# Patient Record
Sex: Female | Born: 1945 | Race: White | Hispanic: No | Smoking: Never smoker
Health system: Southern US, Community
[De-identification: ages and names within clinical notes are randomized; demographics above are authoritative.]

## PROBLEM LIST (undated history)

## (undated) DIAGNOSIS — J302 Other seasonal allergic rhinitis: Secondary | ICD-10-CM

## (undated) DIAGNOSIS — C801 Malignant (primary) neoplasm, unspecified: Secondary | ICD-10-CM

## (undated) DIAGNOSIS — N6019 Diffuse cystic mastopathy of unspecified breast: Secondary | ICD-10-CM

## (undated) DIAGNOSIS — M25562 Pain in left knee: Secondary | ICD-10-CM

## (undated) DIAGNOSIS — E559 Vitamin D deficiency, unspecified: Secondary | ICD-10-CM

## (undated) DIAGNOSIS — M1990 Unspecified osteoarthritis, unspecified site: Secondary | ICD-10-CM

## (undated) DIAGNOSIS — N952 Postmenopausal atrophic vaginitis: Secondary | ICD-10-CM

## (undated) DIAGNOSIS — K219 Gastro-esophageal reflux disease without esophagitis: Secondary | ICD-10-CM

## (undated) DIAGNOSIS — F325 Major depressive disorder, single episode, in full remission: Secondary | ICD-10-CM

## (undated) DIAGNOSIS — N950 Postmenopausal bleeding: Secondary | ICD-10-CM

## (undated) DIAGNOSIS — D493 Neoplasm of unspecified behavior of breast: Secondary | ICD-10-CM

## (undated) DIAGNOSIS — I509 Heart failure, unspecified: Secondary | ICD-10-CM

## (undated) DIAGNOSIS — G4730 Sleep apnea, unspecified: Secondary | ICD-10-CM

## (undated) DIAGNOSIS — M8580 Other specified disorders of bone density and structure, unspecified site: Secondary | ICD-10-CM

## (undated) DIAGNOSIS — T7840XA Allergy, unspecified, initial encounter: Secondary | ICD-10-CM

## (undated) DIAGNOSIS — M25561 Pain in right knee: Secondary | ICD-10-CM

## (undated) DIAGNOSIS — Z973 Presence of spectacles and contact lenses: Secondary | ICD-10-CM

## (undated) DIAGNOSIS — E785 Hyperlipidemia, unspecified: Secondary | ICD-10-CM

## (undated) DIAGNOSIS — Z87442 Personal history of urinary calculi: Secondary | ICD-10-CM

## (undated) DIAGNOSIS — R011 Cardiac murmur, unspecified: Secondary | ICD-10-CM

## (undated) DIAGNOSIS — N189 Chronic kidney disease, unspecified: Secondary | ICD-10-CM

## (undated) DIAGNOSIS — R920 Mammographic microcalcification found on diagnostic imaging of breast: Secondary | ICD-10-CM

## (undated) HISTORY — DX: Hyperlipidemia, unspecified: E78.5

## (undated) HISTORY — DX: Pain in right knee: M25.561

## (undated) HISTORY — DX: Major depressive disorder, single episode, in full remission: F32.5

## (undated) HISTORY — PX: WISDOM TOOTH EXTRACTION: SHX21

## (undated) HISTORY — DX: Pain in right knee: M25.562

## (undated) HISTORY — DX: Postmenopausal bleeding: N95.0

## (undated) HISTORY — DX: Diffuse cystic mastopathy of unspecified breast: N60.19

## (undated) HISTORY — DX: Mammographic microcalcification found on diagnostic imaging of breast: R92.0

## (undated) HISTORY — DX: Chronic kidney disease, unspecified: N18.9

## (undated) HISTORY — PX: CATARACT EXTRACTION, BILATERAL: SHX1313

## (undated) HISTORY — DX: Neoplasm of unspecified behavior of breast: D49.3

## (undated) HISTORY — DX: Gastro-esophageal reflux disease without esophagitis: K21.9

## (undated) HISTORY — DX: Postmenopausal atrophic vaginitis: N95.2

## (undated) HISTORY — DX: Malignant (primary) neoplasm, unspecified: C80.1

## (undated) HISTORY — DX: Vitamin D deficiency, unspecified: E55.9

## (undated) HISTORY — DX: Other specified disorders of bone density and structure, unspecified site: M85.80

## (undated) HISTORY — PX: HYSTEROSCOPY: SHX211

## (undated) HISTORY — DX: Allergy, unspecified, initial encounter: T78.40XA

## (undated) HISTORY — PX: DILATION AND CURETTAGE OF UTERUS: SHX78

## (undated) HISTORY — PX: COLONOSCOPY: SHX174

## (undated) HISTORY — DX: Sleep apnea, unspecified: G47.30

## (undated) HISTORY — DX: Heart failure, unspecified: I50.9

---

## 1998-03-22 ENCOUNTER — Other Ambulatory Visit: Admission: RE | Admit: 1998-03-22 | Discharge: 1998-03-22 | Payer: Self-pay | Admitting: Obstetrics and Gynecology

## 1999-04-05 ENCOUNTER — Other Ambulatory Visit: Admission: RE | Admit: 1999-04-05 | Discharge: 1999-04-05 | Payer: Self-pay | Admitting: Obstetrics and Gynecology

## 1999-05-10 ENCOUNTER — Other Ambulatory Visit: Admission: RE | Admit: 1999-05-10 | Discharge: 1999-05-10 | Payer: Self-pay | Admitting: Obstetrics and Gynecology

## 2000-04-11 ENCOUNTER — Other Ambulatory Visit: Admission: RE | Admit: 2000-04-11 | Discharge: 2000-04-11 | Payer: Self-pay | Admitting: Obstetrics and Gynecology

## 2000-09-16 HISTORY — PX: ENDOMETRIAL BIOPSY: SHX622

## 2000-12-22 ENCOUNTER — Ambulatory Visit (HOSPITAL_COMMUNITY): Admission: RE | Admit: 2000-12-22 | Discharge: 2000-12-22 | Payer: Self-pay | Admitting: Obstetrics and Gynecology

## 2000-12-22 ENCOUNTER — Encounter (INDEPENDENT_AMBULATORY_CARE_PROVIDER_SITE_OTHER): Payer: Self-pay

## 2000-12-22 ENCOUNTER — Encounter: Payer: Self-pay | Admitting: Obstetrics and Gynecology

## 2001-11-20 ENCOUNTER — Other Ambulatory Visit: Admission: RE | Admit: 2001-11-20 | Discharge: 2001-11-20 | Payer: Self-pay | Admitting: Obstetrics and Gynecology

## 2003-01-03 ENCOUNTER — Other Ambulatory Visit: Admission: RE | Admit: 2003-01-03 | Discharge: 2003-01-03 | Payer: Self-pay | Admitting: Obstetrics and Gynecology

## 2004-09-13 ENCOUNTER — Other Ambulatory Visit: Admission: RE | Admit: 2004-09-13 | Discharge: 2004-09-13 | Payer: Self-pay | Admitting: Obstetrics and Gynecology

## 2004-12-17 ENCOUNTER — Ambulatory Visit: Payer: Self-pay | Admitting: Internal Medicine

## 2005-01-10 ENCOUNTER — Ambulatory Visit: Payer: Self-pay | Admitting: Internal Medicine

## 2005-10-02 ENCOUNTER — Other Ambulatory Visit: Admission: RE | Admit: 2005-10-02 | Discharge: 2005-10-02 | Payer: Self-pay | Admitting: Obstetrics and Gynecology

## 2006-06-24 ENCOUNTER — Ambulatory Visit: Payer: Self-pay | Admitting: Internal Medicine

## 2006-07-02 ENCOUNTER — Ambulatory Visit: Payer: Self-pay | Admitting: Internal Medicine

## 2006-07-02 LAB — CONVERTED CEMR LAB
Chol/HDL Ratio, serum: 3.4
Cholesterol: 200 mg/dL (ref 0–200)
HDL: 59.3 mg/dL (ref >39.0)
Hgb A1c MFr Bld: 5.5 % (ref 4.6–6.0)
LDL Cholesterol: 126 mg/dL — ABNORMAL HIGH (ref 0–99)
Triglyceride fasting, serum: 72 mg/dL (ref 0–149)
VLDL: 14 mg/dL (ref 0–40)

## 2006-09-18 ENCOUNTER — Ambulatory Visit: Payer: Self-pay | Admitting: Internal Medicine

## 2006-09-18 LAB — CONVERTED CEMR LAB
ALT: 16 units/L (ref 0–40)
AST: 20 units/L (ref 0–37)
Chol/HDL Ratio, serum: 2.8
Cholesterol: 163 mg/dL (ref 0–200)
HDL: 58.9 mg/dL (ref >39.0)
LDL Cholesterol: 91 mg/dL (ref 0–99)
Triglyceride fasting, serum: 65 mg/dL (ref 0–149)
VLDL: 13 mg/dL (ref 0–40)

## 2006-10-03 ENCOUNTER — Other Ambulatory Visit: Admission: RE | Admit: 2006-10-03 | Discharge: 2006-10-03 | Payer: Self-pay | Admitting: Obstetrics and Gynecology

## 2007-05-11 ENCOUNTER — Telehealth (INDEPENDENT_AMBULATORY_CARE_PROVIDER_SITE_OTHER): Payer: Self-pay | Admitting: *Deleted

## 2007-07-03 ENCOUNTER — Telehealth (INDEPENDENT_AMBULATORY_CARE_PROVIDER_SITE_OTHER): Payer: Self-pay | Admitting: *Deleted

## 2007-07-03 ENCOUNTER — Ambulatory Visit: Payer: Self-pay | Admitting: Internal Medicine

## 2007-07-03 DIAGNOSIS — R0989 Other specified symptoms and signs involving the circulatory and respiratory systems: Secondary | ICD-10-CM

## 2007-07-03 DIAGNOSIS — G5600 Carpal tunnel syndrome, unspecified upper limb: Secondary | ICD-10-CM

## 2007-07-07 ENCOUNTER — Telehealth: Payer: Self-pay | Admitting: Internal Medicine

## 2007-08-17 ENCOUNTER — Ambulatory Visit: Payer: Self-pay | Admitting: Internal Medicine

## 2007-08-17 DIAGNOSIS — E782 Mixed hyperlipidemia: Secondary | ICD-10-CM

## 2007-08-17 DIAGNOSIS — Z8719 Personal history of other diseases of the digestive system: Secondary | ICD-10-CM

## 2007-08-24 ENCOUNTER — Encounter: Payer: Self-pay | Admitting: Internal Medicine

## 2007-08-24 LAB — CONVERTED CEMR LAB
ALT: 16 units/L (ref 0–35)
AST: 21 units/L (ref 0–37)
Albumin: 3.9 g/dL (ref 3.5–5.2)
Alkaline Phosphatase: 84 units/L (ref 39–117)
BUN: 9 mg/dL (ref 6–23)
Basophils Absolute: 0 10*3/uL (ref 0.0–0.1)
Basophils Relative: 0.7 % (ref 0.0–1.0)
Bilirubin, Direct: 0.1 mg/dL (ref 0.0–0.3)
CO2: 32 meq/L (ref 19–32)
Calcium: 9.3 mg/dL (ref 8.4–10.5)
Chloride: 105 meq/L (ref 96–112)
Cholesterol: 151 mg/dL (ref 0–200)
Creatinine, Ser: 0.7 mg/dL (ref 0.4–1.2)
Eosinophils Absolute: 0.4 10*3/uL (ref 0.0–0.6)
Eosinophils Relative: 7.2 % — ABNORMAL HIGH (ref 0.0–5.0)
GFR calc Af Amer: 109 mL/min
GFR calc non Af Amer: 90 mL/min
Glucose, Bld: 107 mg/dL — ABNORMAL HIGH (ref 70–99)
HCT: 40.6 % (ref 36.0–46.0)
HDL: 67 mg/dL (ref >39.0)
Hemoglobin: 14.2 g/dL (ref 12.0–15.0)
Hgb A1c MFr Bld: 5.8 % (ref 4.6–6.0)
LDL Cholesterol: 66 mg/dL (ref 0–99)
Lymphocytes Relative: 29.7 % (ref 12.0–46.0)
MCHC: 35 g/dL (ref 30.0–36.0)
MCV: 88.2 fL (ref 78.0–100.0)
Monocytes Absolute: 0.4 10*3/uL (ref 0.2–0.7)
Monocytes Relative: 7.3 % (ref 3.0–11.0)
Neutro Abs: 3.2 10*3/uL (ref 1.4–7.7)
Neutrophils Relative %: 55.1 % (ref 43.0–77.0)
Platelets: 190 10*3/uL (ref 150–400)
Potassium: 4.1 meq/L (ref 3.5–5.1)
RBC: 4.6 M/uL (ref 3.87–5.11)
RDW: 12.4 % (ref 11.5–14.6)
Sodium: 142 meq/L (ref 135–145)
TSH: 0.7 microintl units/mL (ref 0.35–5.50)
Total Bilirubin: 0.8 mg/dL (ref 0.3–1.2)
Total CHOL/HDL Ratio: 2.3
Total Protein: 7.2 g/dL (ref 6.0–8.3)
Triglycerides: 88 mg/dL (ref 0–149)
VLDL: 18 mg/dL (ref 0–40)
WBC: 5.7 10*3/uL (ref 4.5–10.5)

## 2007-10-07 ENCOUNTER — Other Ambulatory Visit: Admission: RE | Admit: 2007-10-07 | Discharge: 2007-10-07 | Payer: Self-pay | Admitting: Obstetrics and Gynecology

## 2007-11-06 ENCOUNTER — Ambulatory Visit: Payer: Self-pay | Admitting: Internal Medicine

## 2008-06-22 ENCOUNTER — Encounter (INDEPENDENT_AMBULATORY_CARE_PROVIDER_SITE_OTHER): Payer: Self-pay | Admitting: *Deleted

## 2008-08-29 ENCOUNTER — Encounter: Payer: Self-pay | Admitting: Internal Medicine

## 2008-10-14 ENCOUNTER — Other Ambulatory Visit: Admission: RE | Admit: 2008-10-14 | Discharge: 2008-10-14 | Payer: Self-pay | Admitting: Obstetrics and Gynecology

## 2008-10-14 ENCOUNTER — Ambulatory Visit: Payer: Self-pay | Admitting: Obstetrics and Gynecology

## 2008-10-14 ENCOUNTER — Encounter: Payer: Self-pay | Admitting: Obstetrics and Gynecology

## 2008-11-04 ENCOUNTER — Ambulatory Visit: Payer: Self-pay | Admitting: Obstetrics and Gynecology

## 2008-11-15 ENCOUNTER — Ambulatory Visit: Payer: Self-pay | Admitting: Internal Medicine

## 2008-11-15 DIAGNOSIS — R7309 Other abnormal glucose: Secondary | ICD-10-CM

## 2008-11-15 DIAGNOSIS — E559 Vitamin D deficiency, unspecified: Secondary | ICD-10-CM

## 2008-11-23 LAB — CONVERTED CEMR LAB
ALT: 20 units/L (ref 0–35)
AST: 22 units/L (ref 0–37)
Albumin: 4.1 g/dL (ref 3.5–5.2)
Alkaline Phosphatase: 80 units/L (ref 39–117)
BUN: 14 mg/dL (ref 6–23)
Basophils Absolute: 0.1 10*3/uL (ref 0.0–0.1)
Basophils Relative: 1.5 % (ref 0.0–3.0)
Bilirubin, Direct: 0.1 mg/dL (ref 0.0–0.3)
CO2: 31 meq/L (ref 19–32)
Calcium: 9.3 mg/dL (ref 8.4–10.5)
Chloride: 101 meq/L (ref 96–112)
Cholesterol: 170 mg/dL (ref 0–200)
Creatinine, Ser: 0.8 mg/dL (ref 0.4–1.2)
Eosinophils Absolute: 0.3 10*3/uL (ref 0.0–0.7)
Eosinophils Relative: 4.2 % (ref 0.0–5.0)
GFR calc Af Amer: 93 mL/min
GFR calc non Af Amer: 77 mL/min
Glucose, Bld: 87 mg/dL (ref 70–99)
HCT: 42.9 % (ref 36.0–46.0)
HDL: 71.2 mg/dL (ref >39.0)
Hemoglobin: 14.7 g/dL (ref 12.0–15.0)
Hgb A1c MFr Bld: 5.7 % (ref 4.6–6.0)
LDL Cholesterol: 84 mg/dL (ref 0–99)
Lymphocytes Relative: 31.1 % (ref 12.0–46.0)
MCHC: 34.2 g/dL (ref 30.0–36.0)
MCV: 88.3 fL (ref 78.0–100.0)
Monocytes Absolute: 0.4 10*3/uL (ref 0.1–1.0)
Monocytes Relative: 7 % (ref 3.0–12.0)
Neutro Abs: 3.5 10*3/uL (ref 1.4–7.7)
Neutrophils Relative %: 56.2 % (ref 43.0–77.0)
Platelets: 157 10*3/uL (ref 150–400)
Potassium: 4.1 meq/L (ref 3.5–5.1)
RBC: 4.86 M/uL (ref 3.87–5.11)
RDW: 12.5 % (ref 11.5–14.6)
Sodium: 140 meq/L (ref 135–145)
TSH: 0.64 microintl units/mL (ref 0.35–5.50)
Total Bilirubin: 1.2 mg/dL (ref 0.3–1.2)
Total CHOL/HDL Ratio: 2.4
Total Protein: 7.6 g/dL (ref 6.0–8.3)
Triglycerides: 76 mg/dL (ref 0–149)
VLDL: 15 mg/dL (ref 0–40)
Vit D, 25-Hydroxy: 32 ng/mL (ref 30–89)
WBC: 6.3 10*3/uL (ref 4.5–10.5)

## 2008-11-24 ENCOUNTER — Encounter (INDEPENDENT_AMBULATORY_CARE_PROVIDER_SITE_OTHER): Payer: Self-pay | Admitting: *Deleted

## 2008-12-30 ENCOUNTER — Encounter (INDEPENDENT_AMBULATORY_CARE_PROVIDER_SITE_OTHER): Payer: Self-pay | Admitting: *Deleted

## 2009-12-13 ENCOUNTER — Ambulatory Visit: Payer: Self-pay | Admitting: Obstetrics and Gynecology

## 2009-12-13 ENCOUNTER — Other Ambulatory Visit: Admission: RE | Admit: 2009-12-13 | Discharge: 2009-12-13 | Payer: Self-pay | Admitting: Obstetrics and Gynecology

## 2009-12-18 ENCOUNTER — Ambulatory Visit: Payer: Self-pay | Admitting: Obstetrics and Gynecology

## 2010-02-20 ENCOUNTER — Ambulatory Visit: Payer: Self-pay | Admitting: Obstetrics and Gynecology

## 2010-03-27 ENCOUNTER — Ambulatory Visit: Payer: Self-pay | Admitting: Obstetrics and Gynecology

## 2010-04-04 ENCOUNTER — Ambulatory Visit: Payer: Self-pay | Admitting: Obstetrics and Gynecology

## 2010-05-18 ENCOUNTER — Telehealth (INDEPENDENT_AMBULATORY_CARE_PROVIDER_SITE_OTHER): Payer: Self-pay | Admitting: *Deleted

## 2010-10-16 NOTE — Progress Notes (Signed)
Summary: refill  Phone Note Refill Request Message from:  Fax from Pharmacy on May 18, 2010 4:07 PM  Refills Requested: Medication #1:  LORATIDINE 10MG  TAKE ONE TABLET DAILY [facility] [address] - fax [phone]  Initial call taken by: Okey Regal [place],  May 18, 2010 4:11 PM  Follow-up for Phone Call        Patient needs to schedule a yearly appointment (30 min) Follow-up by: Shonna Chock CMA,  May 18, 2010 4:18 PM    New/Updated Medications: * LORATIDINE 10MG  1 by mouth once daily **APPOINTMENT DUE** Prescriptions: LORATIDINE 10MG  1 by mouth once daily **APPOINTMENT DUE**  #30 x 0   Entered by:   Shonna Chock CMA   Authorized by:   Marga Melnick MD   Signed by:   Shonna Chock CMA on 05/18/2010   Method used:   Faxed to ...       [facility] [address] [phone]* (retail)       [address]       [place], [place]  [address]       Ph: [phone]       Fax: [phone]   RxID:   [id number]

## 2010-12-10 ENCOUNTER — Other Ambulatory Visit (INDEPENDENT_AMBULATORY_CARE_PROVIDER_SITE_OTHER): Payer: BC Managed Care – PPO

## 2010-12-10 DIAGNOSIS — T887XXA Unspecified adverse effect of drug or medicament, initial encounter: Secondary | ICD-10-CM

## 2010-12-10 DIAGNOSIS — E785 Hyperlipidemia, unspecified: Secondary | ICD-10-CM

## 2010-12-10 LAB — LIPID PANEL
Cholesterol: 161 mg/dL (ref 0–200)
HDL: 81.7 mg/dL (ref >39.00)
LDL Cholesterol: 65 mg/dL (ref 0–99)
Total CHOL/HDL Ratio: 2
Triglycerides: 71 mg/dL (ref 0.0–149.0)
VLDL: 14.2 mg/dL (ref 0.0–40.0)

## 2010-12-10 LAB — HEPATIC FUNCTION PANEL
AST: 22 U/L (ref 0–37)
Albumin: 3.9 g/dL (ref 3.5–5.2)
Alkaline Phosphatase: 87 U/L (ref 39–117)
Total Protein: 6.7 g/dL (ref 6.0–8.3)

## 2011-01-07 ENCOUNTER — Encounter (INDEPENDENT_AMBULATORY_CARE_PROVIDER_SITE_OTHER): Payer: BC Managed Care – PPO | Admitting: Obstetrics and Gynecology

## 2011-01-07 ENCOUNTER — Other Ambulatory Visit (HOSPITAL_COMMUNITY)
Admission: RE | Admit: 2011-01-07 | Discharge: 2011-01-07 | Disposition: A | Discharge status: Home / Self Care | Payer: BC Managed Care – PPO | Source: Ambulatory Visit | Attending: Obstetrics and Gynecology | Admitting: Obstetrics and Gynecology

## 2011-01-07 ENCOUNTER — Other Ambulatory Visit: Payer: Self-pay | Admitting: Obstetrics and Gynecology

## 2011-01-07 DIAGNOSIS — Z01419 Encounter for gynecological examination (general) (routine) without abnormal findings: Secondary | ICD-10-CM

## 2011-01-07 DIAGNOSIS — Z124 Encounter for screening for malignant neoplasm of cervix: Secondary | ICD-10-CM | POA: Insufficient documentation

## 2011-01-14 ENCOUNTER — Telehealth: Payer: Self-pay | Admitting: Internal Medicine

## 2011-01-14 MED ORDER — PRAVASTATIN SODIUM 40 MG PO TABS: 40.0000 mg | ORAL_TABLET | ORAL | Status: DC

## 2011-01-14 NOTE — Telephone Encounter (Signed)
Dr.Hopper please advise, I reviewed Lab results in Epic and [facility] and they are unsigned from 12/10/2010

## 2011-01-14 NOTE — Telephone Encounter (Signed)
Patient had labs [phone] she hasnt received result --- she needs refill for pravistatin -- [facility] [address]

## 2011-01-14 NOTE — Telephone Encounter (Signed)
Questionably  not seen since March 2010. Pravastatin can be refilled for 90 days, but she needs followup office visit.

## 2011-01-14 NOTE — Telephone Encounter (Signed)
I called patient and left message on VM with Dr.Hopper's instructions and informed patient rx sent to pharmacy

## 2011-01-22 DIAGNOSIS — Z1211 Encounter for screening for malignant neoplasm of colon: Secondary | ICD-10-CM

## 2011-02-01 NOTE — Op Note (Signed)
[facility]  Patient:    [name], [name]                          MRN: [id number] Proc. Date: 12/22/00 Attending:  Rande Brunt. Eda Paschal, M.D.                           Operative Report  PREOPERATIVE DIAGNOSES:  Postmenopausal bleeding, cervical stenosis.  POSTOPERATIVE DIAGNOSES:  Postmenopausal bleeding, cervical stenosis, polypoid appearance of endocervix and several small leiomyoma within the uterine cavity.  INDICATIONS FOR PROCEDURE:  The patient presented to the office with postmenopausal bleeding on hormone replacement therapy. Because of a severely stenotic cervix, her cervix could not be dilated and therefore endometrial samplings could not be obtained. This was in spite of an ______ paracervical and complete patient cooperation. Ultrasound was done which showed a minimally enlarged endometrial cavity at 5 mm without any specific defects. However, the patient had an extremely abnormal looking endocervix that showed hypoechoic echo pattern with linear cystic areas and it was very unusual looking. As a result of the inability to sample the endometrium and/or the endocervix and this abnormal appearing endocervix on ultrasound, it was felt that the patient should be brought to the hospital to accomplish endometrial sampling. Because its felt that anesthesia was reasonable in the office, it was felt that general anesthesia alone would not be enough to allow this to be done and therefore it will be done under ultrasonic guidance for the above. If it is completely impossible to dilate the cervix then she will undergo a LEEP procedure to be sure that we sample this area that looks so abnormal in the endocervix on ultrasound.  FINDINGS:  External and vaginal exam was within normal limits. The cervix was stenotic. Once it was finally dilated, a severe amount of brown mucusy thick material was removed from the endocervix consistent with cervical stenosis  and buildup of blood. The uterus was anteverted, normal size and shape. There was no uterine descensus. There were several very small leiomyoma that were seen within the cavity that did not appear to be the source of any bleeding. They were both approximately 1-2 cm. Adnexa were palpably normal.  DESCRIPTION OF PROCEDURE:  After adequate general endotracheal anesthesia, the patient was placed in the dorsal lithotomy position, prepped and draped in the usual sterile manner. A single tooth tenaculum was placed in the anterior lip of the cervix and using extremely small cervical dilator, an attempt was made to dilate the cervix. Initially not even the external os could be pierced but finally it could be. Ultrasonic guidance was then utilized at this point to be sure the endometrial cavity was entered. The patients bladder was reasonably fully from her IV fluids but not full enough that abdominal ultrasound would help [place] with the above and therefore a Foley catheter was inserted in the patients bladder and her bladder was filled with fluid to enable good abdominal ultrasound. At this point going very carefully and slowly and having the ultrasound guide [place], we were able to dilate her to a #33 Pratt dilator. A hysteroscopic examination was done first with a diagnostic hysteroscope and then with a resectoscope and the above findings were noted. Once the cervix was finally dilated, copious amounts of brown thick mucosal like tissue was removed and this was all sent to pathology for tissue diagnosis. The appearance of the endocervix had a polypoid like  appearance. It was felt that this was probably due to the cervical stenosis as well as the retained blood. This area was vigorously curettaged and all material was sent to the lab. It really was impossible to resect this area without actually resecting almost the entire cervix and creating bleeding that would have been uncontrollable short of  hysterectomy. Even though the resectoscope was finally entered the uterus, it was still very difficult as a result of all the above and it was not felt that it was either wise nor prudent to try to resect these exceedingly small myomas that were present there that probably were not the source of the bleeding; however, we were now able to get adequate endometrial sampling. The rest of the endometrium looked exceedingly atrophic and it was felt that the patients bleeding was probably secondary to hormonal replacement, cervical stenosis and the above. At the termination of the procedure, there was no significant bleeding noted. Fluid deficit was minimal less than 100. The patient tolerated the procedure well and left the operating room in satisfactory condition. DD:  12/22/00 TD:  12/22/00 Job: [id number]

## 2011-03-30 ENCOUNTER — Encounter: Payer: Self-pay | Admitting: Internal Medicine

## 2011-04-01 ENCOUNTER — Ambulatory Visit (INDEPENDENT_AMBULATORY_CARE_PROVIDER_SITE_OTHER): Payer: BC Managed Care – PPO | Admitting: Internal Medicine

## 2011-04-01 ENCOUNTER — Encounter: Payer: BC Managed Care – PPO | Admitting: Internal Medicine

## 2011-04-01 ENCOUNTER — Encounter: Payer: Self-pay | Admitting: Internal Medicine

## 2011-04-01 VITALS — BP 116/71 | HR 60 | Temp 97.7°F | Resp 12 | Ht 64.75 in | Wt 201.0 lb

## 2011-04-01 DIAGNOSIS — E559 Vitamin D deficiency, unspecified: Secondary | ICD-10-CM

## 2011-04-01 DIAGNOSIS — Z0000 Encounter for general adult medical examination without abnormal findings: Secondary | ICD-10-CM

## 2011-04-01 DIAGNOSIS — G5600 Carpal tunnel syndrome, unspecified upper limb: Secondary | ICD-10-CM

## 2011-04-01 DIAGNOSIS — E782 Mixed hyperlipidemia: Secondary | ICD-10-CM

## 2011-04-01 DIAGNOSIS — Z8719 Personal history of other diseases of the digestive system: Secondary | ICD-10-CM

## 2011-04-01 MED ORDER — FLUTICASONE PROPIONATE 50 MCG/ACT NA SUSP: 2.0000 | Freq: Every day | NASAL | Status: DC

## 2011-04-01 NOTE — Progress Notes (Signed)
Subjective:    Patient ID: [name], female    DOB: [birth year], 64 y.o.   MRN: [id number]  HPI  [name]  is here for a physical; she has no acute issues.      Review of Systems Patient reports no vision/ hearing  changes, adenopathy,fever, weight change,  persistant / recurrent hoarseness , chest pain,palpitations,edema,persistant /recurrent cough, hemoptysis, dyspnea( rest/ exertional/paroxysmal nocturnal), gastrointestinal bleeding(melena, rectal bleeding), abdominal pain, significant heartburn,  bowel changes,GU symptoms(dysuria, hematuria,pyuria, incontinence) ), Gyn symptoms(abnormal  bleeding , pain),  syncope, focal weakness, memory loss, skin/hair /nail changes,abnormal bruising or bleeding, anxiety,or depression.   She has occasional positional numbness of her hands  In Yoga. She has occasional swallowing issues with large pills but no dysphasia with food.     Objective:   Physical Exam Gen.: Healthy and well-nourished in appearance. Alert, appropriate and cooperative throughout exam. Head: Normocephalic without obvious abnormalities Eyes: No corneal or conjunctival inflammation noted. Pupils equal round reactive to light and accommodation. Fundal exam is benign without hemorrhages, exudate, papilledema. Extraocular motion intact. Vision grossly normal with lenses. Ears: External  ear exam reveals no significant lesions or deformities. Canals clear .TMs normal. Hearing is grossly normal bilaterally. Nose: External nasal exam reveals no deformity or inflammation. Nasal mucosa are pink and moist. No lesions or exudates noted. Septum  : minimal septal dislocation  Mouth: Oral mucosa and oropharynx reveal no lesions or exudates. Teeth in good repair. Neck: No deformities, masses, or tenderness noted. Range of motion & . Thyroid  normal. Lungs: Normal respiratory effort; chest expands symmetrically. Lungs are clear to auscultation without rales, wheezes, or increased work of  breathing. Heart: Normal rate and rhythm. Normal S1 and S2. No gallop, click, or rub. Grade 1/2 - 1 basilar systolic  murmur. Abdomen: Bowel sounds normal; abdomen soft and nontender. No masses, organomegaly or hernias noted. Genitalia: Dr Silvio Clayman   .                                                                                   Musculoskeletal/extremities: No deformity or scoliosis noted of  the thoracic or lumbar spine. No clubbing, cyanosis, edema, or deformity noted. Range of motion  normal .Tone & strength  normal.Joints normal. Nail health  good. Vascular: Carotid, radial artery, dorsalis pedis and  posterior tibial pulses are full and equal.  Neurologic: Alert and oriented x3. Deep tendon reflexes symmetrical and normal. Slight tingling elicited with reflex  hammer over wrists         Skin: Intact without suspicious lesions or rashes. Lymph: No cervical, axillary  lymphadenopathy present. Psych: Mood and affect are normal. Normally interactive                                                                                       Assessment & Plan:  #1  comprehensive physical exam; no acute findings #2 see Problem List with Assessments & Recommendations Plan: see Orders

## 2011-04-01 NOTE — Patient Instructions (Addendum)
Preventive Health Care: Exercise  30-45  minutes a day, 3-4 days a week. Walking is especially valuable in preventing Osteoporosis. Eat a low-fat diet with lots of fruits and vegetables, up to 7-9 servings per day. Avoid obesity; your goal = waist less than 35 inches.Consume less than 30 grams of sugar per day from foods & drinks with High Fructose Corn Syrup as #2,3 or #4 on label. Please  schedule fasting Labs in [place] : NMR Lipoprofile ( 272.4, V17.3).Risk of premature heart attack or stroke increases as LDL or BAD cholesterol rises.Advanced cholesterol panels optimally determine risk base on particle composition ( NMR Lipoprofile ) or by assessing multiple other genetic risks([place] Heart Panel 1304X). These are indicated when LDL is > 130, especially if there is family history of heart attack in males before 77 or women before 20 . Please verify that your TSH and vitamin D level had been checked and are @ goal.TSH (Thyroid Stimulating Hormone) normal range = 0.35- 5.50. Ideal value is 1-3. A  Value below 0.35 indicates excessive thyroid supplementation (HYPERthyroid state) & a Value > 5.50 indicates inadequate replacement.(HYPOthyroid state) Either extreme can have adverse long  term effects.   The normal goal for  Vitamin D is 40-60. Vitamin D, along with calcium( 600 mg twice a day) & weight bearing exercises ( @ least 30 minutes of walking @ least 3X/ week),  is essential for bone health. Vitamin D is the # 1 cause of muscle pain in women.

## 2011-06-03 ENCOUNTER — Telehealth: Payer: Self-pay | Admitting: Internal Medicine

## 2011-06-03 NOTE — Telephone Encounter (Signed)
Left message on voicemail for patient to contact Dr.Gottsegen's office and have them fax to [phone] (Fax machine at nurses station)

## 2011-06-03 NOTE — Telephone Encounter (Signed)
Today I have not received these results; please ask Dr. Verl Dicker office to fax them if possible .Thanks

## 2011-06-03 NOTE — Telephone Encounter (Signed)
Patient said dr Oletha Blend  Was to fax lab  Vit d & tsh she wants to know if it was received & if she should repeat

## 2011-06-06 ENCOUNTER — Telehealth: Payer: Self-pay

## 2011-06-06 NOTE — Telephone Encounter (Signed)
Patient left message on voicemail questioning if we received labs from other Doctor office (Vit D and TSH), if we did will Dr.Hopper please review and advise.  (Labs were received and placed on ledge for review), Dr.Hopper please advise

## 2011-06-07 ENCOUNTER — Telehealth: Payer: Self-pay

## 2011-06-07 NOTE — Telephone Encounter (Signed)
OPENED IN ERROR

## 2011-06-07 NOTE — Telephone Encounter (Signed)
Left message to call office

## 2011-06-07 NOTE — Telephone Encounter (Signed)
Gyn records revealed a vitamin D level of 34 on 01/20/2008; thyroid function test was not checked.  Minimal vitamin D level would be 40-60; I recommend checking TSH and vitamin D levels(794.5,268.9)

## 2011-06-07 NOTE — Telephone Encounter (Signed)
Discussed with patient and she had questions about additional labs..Call xfr'd to Chrae's Voicemail per her request and she will call patient back to discuss    KP

## 2011-06-07 NOTE — Telephone Encounter (Signed)
Spoke with patient and scheduled appointment.

## 2011-06-28 ENCOUNTER — Other Ambulatory Visit: Payer: Self-pay | Admitting: Internal Medicine

## 2011-06-28 DIAGNOSIS — E550 Rickets, active: Secondary | ICD-10-CM

## 2011-06-28 DIAGNOSIS — E039 Hypothyroidism, unspecified: Secondary | ICD-10-CM

## 2011-06-28 DIAGNOSIS — E785 Hyperlipidemia, unspecified: Secondary | ICD-10-CM

## 2011-06-28 DIAGNOSIS — T887XXA Unspecified adverse effect of drug or medicament, initial encounter: Secondary | ICD-10-CM

## 2011-07-01 ENCOUNTER — Other Ambulatory Visit (INDEPENDENT_AMBULATORY_CARE_PROVIDER_SITE_OTHER): Payer: Medicare Other

## 2011-07-01 ENCOUNTER — Other Ambulatory Visit: Payer: Self-pay | Admitting: Internal Medicine

## 2011-07-01 DIAGNOSIS — E785 Hyperlipidemia, unspecified: Secondary | ICD-10-CM

## 2011-07-01 DIAGNOSIS — E039 Hypothyroidism, unspecified: Secondary | ICD-10-CM

## 2011-07-01 DIAGNOSIS — T887XXA Unspecified adverse effect of drug or medicament, initial encounter: Secondary | ICD-10-CM

## 2011-07-01 DIAGNOSIS — E550 Rickets, active: Secondary | ICD-10-CM

## 2011-07-01 NOTE — Progress Notes (Signed)
Labs only

## 2011-07-02 NOTE — Telephone Encounter (Signed)
error 

## 2011-07-08 ENCOUNTER — Encounter: Payer: Self-pay | Admitting: Internal Medicine

## 2011-07-11 ENCOUNTER — Encounter: Payer: Self-pay | Admitting: Internal Medicine

## 2011-11-26 ENCOUNTER — Encounter: Payer: Self-pay | Admitting: Obstetrics and Gynecology

## 2011-11-28 ENCOUNTER — Other Ambulatory Visit: Payer: Self-pay | Admitting: *Deleted

## 2011-11-28 DIAGNOSIS — R928 Other abnormal and inconclusive findings on diagnostic imaging of breast: Secondary | ICD-10-CM

## 2011-11-29 ENCOUNTER — Other Ambulatory Visit: Payer: Self-pay | Admitting: Obstetrics and Gynecology

## 2011-11-29 ENCOUNTER — Other Ambulatory Visit: Payer: Self-pay | Admitting: *Deleted

## 2011-11-29 DIAGNOSIS — R921 Mammographic calcification found on diagnostic imaging of breast: Secondary | ICD-10-CM

## 2011-12-02 ENCOUNTER — Other Ambulatory Visit: Payer: Self-pay | Admitting: *Deleted

## 2011-12-02 DIAGNOSIS — Z712 Person consulting for explanation of examination or test findings: Secondary | ICD-10-CM

## 2011-12-11 ENCOUNTER — Encounter: Payer: Self-pay | Admitting: Gastroenterology

## 2011-12-19 ENCOUNTER — Encounter (INDEPENDENT_AMBULATORY_CARE_PROVIDER_SITE_OTHER): Payer: Self-pay | Admitting: Surgery

## 2011-12-20 ENCOUNTER — Telehealth (INDEPENDENT_AMBULATORY_CARE_PROVIDER_SITE_OTHER): Payer: Self-pay

## 2011-12-20 NOTE — Telephone Encounter (Signed)
Left message about time change to 10:45 from 11:30am on April 8th, 2013.

## 2011-12-23 ENCOUNTER — Telehealth (INDEPENDENT_AMBULATORY_CARE_PROVIDER_SITE_OTHER): Payer: Self-pay | Admitting: Surgery

## 2011-12-23 ENCOUNTER — Encounter (INDEPENDENT_AMBULATORY_CARE_PROVIDER_SITE_OTHER): Payer: Self-pay | Admitting: Surgery

## 2011-12-23 ENCOUNTER — Ambulatory Visit (INDEPENDENT_AMBULATORY_CARE_PROVIDER_SITE_OTHER): Payer: Medicare Other | Admitting: Surgery

## 2011-12-23 VITALS — BP 128/76 | HR 70 | Temp 97.4°F | Resp 18 | Ht 64.5 in | Wt 202.8 lb

## 2011-12-23 DIAGNOSIS — N6089 Other benign mammary dysplasias of unspecified breast: Secondary | ICD-10-CM

## 2011-12-23 DIAGNOSIS — N6099 Unspecified benign mammary dysplasia of unspecified breast: Secondary | ICD-10-CM

## 2011-12-23 NOTE — Progress Notes (Signed)
Chief Complaint  Patient presents with  . New Evaluation    Breast - atypical hyperplasia for excision - referral from Dr. 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 Patient had routine screening mammogram in early March 2013. This showed a cluster of calcifications in the right breast. The patient underwent additional views and stereotactic core needle biopsy. 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 Patient is referred at this time for wire localized excision.  Patient has no prior history of breast disease. She has had no prior breast biopsies. There is no immediate family member with a history of breast cancer.  Past Medical History  Diagnosis Date  . Hyperlipidemia   . Microcalcifications of the breast     atypical ductal hyperplasia with calcifications  . Breast lobular neoplasia     atypical  . Fibrocystic breast changes   . Osteopenia      Current Outpatient Prescriptions  Medication Sig Dispense Refill  . Calcium-Magnesium 250-125 MG TABS Take by mouth daily.        . Cholecalciferol (VITAMIN D) 1000 UNITS capsule Take 1,000 Units by mouth daily.        . ergocalciferol (VITAMIN D2) 50000 UNITS capsule Take 50,000 Units by mouth once a week.        . fluticasone (FLONASE) 50 MCG/ACT nasal spray Place 2 sprays into the nose daily.  16 g  11  . loratadine (CLARITIN) 10 MG tablet Take 10 mg by mouth daily.        . Magnesium 500 MG CAPS Take by mouth daily.        . Multiple Vitamin (MULTIVITAMIN) tablet Take 1 tablet by mouth daily.        . Omega-3 Fatty Acids (FISH OIL) 1000 MG CAPS Take by mouth daily.        . Polyethyl Glycol-Propyl Glycol (SYSTANE) 0.4-0.3 % SOLN Apply to eye. 2-3 x daily          Allergies  Allergen Reactions  . Codeine Rash     Family History  Problem Relation Age of  Onset  . Alcohol abuse Mother   . Cancer Father     bone marrow cancer  . Alcohol abuse Father   . Depression Son     Recovering alcoholic  . Depression Maternal Aunt   . Cancer Maternal Uncle     prostate cancer  . Diabetes Paternal Uncle   . Heart attack Paternal Uncle 51  . Cancer Paternal Grandmother     skin cancer     History   Social History  . Marital Status: Divorced    Spouse Name: N/A    Number of Children: N/A  . Years of Education: N/A   Social History Main Topics  . Smoking status: Never Smoker   . Smokeless tobacco: Never Used  . Alcohol Use: 1.2 oz/week    2 Glasses of wine per week     socially 1 - 2 glasses of wine per week  . Drug Use: No  . Sexually Active: None   Other Topics Concern  . None   Social History Narrative  . None     REVIEW OF SYSTEMS - PERTINENT POSITIVES ONLY: Brief right breast nipple discharge after childbirth.  No other significant history of breast disease.  EXAM: Filed Vitals:   12/23/11 1029  BP: 128/76  Pulse: 70    Temp: 97.4 F (36.3 C)  Resp: 18    HEENT: normocephalic; pupils equal and reactive; sclerae clear; dentition good; mucous membranes moist NECK:  symmetric on extension; no palpable anterior or posterior cervical lymphadenopathy; no supraclavicular masses; no tenderness CHEST: clear to auscultation bilaterally without rales, rhonchi, or wheezes BREAST: Right breast with normal nipple areolar complex. Well-healed biopsy site at 12:00 position right upper breast. Slight nodularity to breast parenchyma without dominant or discrete mass. Right axilla is free of adenopathy. Left breast shows normal nipple areolar complex. Slight nodularity of the breast parenchyma without dominant or discrete mass. Left axilla is free of adenopathy. 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 I think she will require wire localized excisional biopsy for definitive diagnosis and management of atypical ductal hyperplasia and atypical lobular hyperplasia. This can be performed as an outpatient procedure. We discussed risk and benefits as well as the possibility of additional surgery. She understands and wishes to proceed.  The risks and benefits of the procedure have been discussed at length with the patient.  The patient understands the proposed procedure, potential alternative treatments, and the course of recovery to be expected.  All of the patient's questions have been answered at this time.  The patient wishes to proceed with surgery.  Emmaus Brandi M. Delisia Mcquiston, MD, FACS General & Endocrine Surgery [facility]   Visit Diagnoses: 1. Atypical ductal hyperplasia of breast, right     Primary Care Physician: William Hopper, MD, MD  Radiology:  Dr. Margaret Bertrand GYN:  Dr. Daniel Gottsegen  

## 2011-12-23 NOTE — Patient Instructions (Signed)
[facility] °Office Phone Number [phone] ° °BREAST BIOPSY/ PARTIAL MASTECTOMY: POST OP INSTRUCTIONS ° °Always review your discharge instruction sheet given to you by the facility where your surgery was performed. ° °IF YOU HAVE DISABILITY OR FAMILY LEAVE FORMS, YOU MUST BRING THEM TO THE OFFICE FOR PROCESSING.  DO NOT GIVE THEM TO YOUR DOCTOR. ° °1. A prescription for pain medication may be given to you upon discharge.  Take your pain medication as prescribed, if needed.  If narcotic pain medicine is not needed, then you may take acetaminophen (Tylenol) or ibuprofen (Advil) as needed. °2. Take your usually prescribed medications unless otherwise directed °3. If you need a refill on your pain medication, please contact your pharmacy.  They will contact our office to request authorization.  Prescriptions will not be filled after 5pm or on week-ends. °4. You should eat very light the first 24 hours after surgery, such as soup, crackers, pudding, etc.  Resume your normal diet the day after surgery. °5. Most patients will experience some swelling and bruising in the breast.  Ice packs and a good support bra will help.  Swelling and bruising can take several days to resolve.  °6. It is common to experience some constipation if taking pain medication after surgery.  Increasing fluid intake and taking a stool softener will usually help or prevent this problem from occurring.  A mild laxative (Milk of Magnesia or Miralax) should be taken according to package directions if there are no bowel movements after 48 hours. °7. Unless discharge instructions indicate otherwise, you may remove your bandages 24-48 hours after surgery, and you may shower at that time.  You may have steri-strips (small skin tapes) in place directly over the incision.  These strips should be left on the skin for 7-10 days.  If your surgeon used skin glue on the incision, you may shower in 24 hours.  The glue will flake off over the  next 2-3 weeks.  Any sutures or staples will be removed at the office during your follow-up visit. °8. ACTIVITIES:  You may resume regular daily activities (gradually increasing) beginning the next day.  Wearing a good support bra or sports bra minimizes pain and swelling.  You may have sexual intercourse when it is comfortable. °a. You may drive when you no longer are taking prescription pain medication, you can comfortably wear a seatbelt, and you can safely maneuver your car and apply brakes. °b. RETURN TO WORK:  ______________________________________________________________________________________ °9. You should see your doctor in the office for a follow-up appointment approximately two weeks after your surgery.  Your doctor’s nurse will typically make your follow-up appointment when she calls you with your pathology report.  Expect your pathology report 2-3 business days after your surgery.  You may call to check if you do not hear from us after three days. °10. OTHER INSTRUCTIONS: _______________________________________________________________________________________________ _____________________________________________________________________________________________________________________________________ °_____________________________________________________________________________________________________________________________________ °_____________________________________________________________________________________________________________________________________ ° °WHEN TO CALL YOUR DOCTOR: °1. Fever over 101.0 °2. Nausea and/or vomiting. °3. Extreme swelling or bruising. °4. Continued bleeding from incision. °5. Increased pain, redness, or drainage from the incision. ° °The clinic staff is available to answer your questions during regular business hours.  Please don’t hesitate to call and ask to speak to one of the nurses for clinical concerns.  If you have a medical emergency, go to the nearest  emergency room or call 911.  A surgeon from [facility] is always on call at the hospital. ° °For further questions, please visit [email or web address]  °

## 2011-12-26 ENCOUNTER — Encounter: Payer: Self-pay | Admitting: Internal Medicine

## 2011-12-27 ENCOUNTER — Encounter: Payer: Self-pay | Admitting: Gynecology

## 2011-12-27 DIAGNOSIS — N950 Postmenopausal bleeding: Secondary | ICD-10-CM | POA: Insufficient documentation

## 2011-12-27 DIAGNOSIS — N952 Postmenopausal atrophic vaginitis: Secondary | ICD-10-CM | POA: Insufficient documentation

## 2012-01-03 ENCOUNTER — Encounter (HOSPITAL_BASED_OUTPATIENT_CLINIC_OR_DEPARTMENT_OTHER): Payer: Self-pay | Admitting: *Deleted

## 2012-01-03 NOTE — Progress Notes (Signed)
No labs needed

## 2012-01-08 ENCOUNTER — Ambulatory Visit (INDEPENDENT_AMBULATORY_CARE_PROVIDER_SITE_OTHER): Payer: Medicare Other | Admitting: Obstetrics and Gynecology

## 2012-01-08 ENCOUNTER — Other Ambulatory Visit (HOSPITAL_COMMUNITY)
Admission: RE | Admit: 2012-01-08 | Discharge: 2012-01-08 | Disposition: A | Discharge status: Home / Self Care | Payer: Medicare Other | Source: Ambulatory Visit | Attending: Obstetrics and Gynecology | Admitting: Obstetrics and Gynecology

## 2012-01-08 ENCOUNTER — Encounter: Payer: Self-pay | Admitting: Obstetrics and Gynecology

## 2012-01-08 VITALS — BP 124/74 | Ht 64.5 in | Wt 203.0 lb

## 2012-01-08 DIAGNOSIS — N6099 Unspecified benign mammary dysplasia of unspecified breast: Secondary | ICD-10-CM

## 2012-01-08 DIAGNOSIS — N3941 Urge incontinence: Secondary | ICD-10-CM

## 2012-01-08 DIAGNOSIS — N952 Postmenopausal atrophic vaginitis: Secondary | ICD-10-CM

## 2012-01-08 DIAGNOSIS — M8580 Other specified disorders of bone density and structure, unspecified site: Secondary | ICD-10-CM

## 2012-01-08 DIAGNOSIS — N6089 Other benign mammary dysplasias of unspecified breast: Secondary | ICD-10-CM

## 2012-01-08 DIAGNOSIS — Z124 Encounter for screening for malignant neoplasm of cervix: Secondary | ICD-10-CM | POA: Insufficient documentation

## 2012-01-08 DIAGNOSIS — N390 Urinary tract infection, site not specified: Secondary | ICD-10-CM

## 2012-01-08 DIAGNOSIS — M899 Disorder of bone, unspecified: Secondary | ICD-10-CM

## 2012-01-08 MED ORDER — ESTROGENS, CONJUGATED 0.625 MG/GM VA CREA: 1.0000 g | TOPICAL_CREAM | Freq: Every day | VAGINAL | Status: DC

## 2012-01-08 NOTE — Progress Notes (Signed)
Patient came to see me today for further followup. She is on Premarin vaginal cream both for treatment of atrophic vaginitis and urgency incontinence with excellent results. She does have a history of recurrent urinary tract infections as well but is doing well currently. She does have osteopenia without an elevated FRAX risk. She takes calcium and vitamin D. She's had no fractures. She is due for bone density later this year. She is having no vaginal bleeding. She is having no pelvic pain. She had an abnormal mammogram and biopsy showed atypical ductal hyperplasia and she is going to have wide excision tomorrow. She is having no vaginal bleeding. She is having no pelvic pain.  ROS: 12 system review done. Pertinent positives above. Other positives are hyperlipidemia, carpal tunnel syndrome, and vitamin D deficiency.  Physical examination: Kennon Portela present. HEENT within normal limits. Neck: Thyroid not large. No masses. Supraclavicular nodes: not enlarged. Breasts: Examined in both sitting and lying  position. No skin changes and no masses. Abdomen: Soft no guarding rebound or masses or hernia. Pelvic: External: Within normal limits. BUS: Within normal limits. Vaginal:within normal limits. Good estrogen effect. No evidence of cystocele rectocele or enterocele. Cervix: clean. Uterus: Normal size and shape. Adnexa: No masses. Rectovaginal exam: Confirmatory and negative. Extremities: Within normal limits.  Assessment: #1. Atrophic vaginitis #2. Osteopenia #3. 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 Surgery for ductal hyperplasia. Bone density in June.

## 2012-01-09 ENCOUNTER — Ambulatory Visit (HOSPITAL_BASED_OUTPATIENT_CLINIC_OR_DEPARTMENT_OTHER): Payer: Medicare Other | Admitting: Certified Registered Nurse Anesthetist

## 2012-01-09 ENCOUNTER — Encounter (HOSPITAL_BASED_OUTPATIENT_CLINIC_OR_DEPARTMENT_OTHER): Payer: Self-pay | Admitting: Certified Registered Nurse Anesthetist

## 2012-01-09 ENCOUNTER — Encounter (HOSPITAL_BASED_OUTPATIENT_CLINIC_OR_DEPARTMENT_OTHER)
Admission: RE | Disposition: A | Discharge status: Home / Self Care | Payer: Self-pay | Source: Ambulatory Visit | Attending: Surgery

## 2012-01-09 ENCOUNTER — Encounter (HOSPITAL_BASED_OUTPATIENT_CLINIC_OR_DEPARTMENT_OTHER): Payer: Self-pay | Admitting: *Deleted

## 2012-01-09 ENCOUNTER — Encounter (HOSPITAL_BASED_OUTPATIENT_CLINIC_OR_DEPARTMENT_OTHER): Payer: Self-pay | Admitting: Anesthesiology

## 2012-01-09 ENCOUNTER — Ambulatory Visit (HOSPITAL_BASED_OUTPATIENT_CLINIC_OR_DEPARTMENT_OTHER)
Admission: RE | Admit: 2012-01-09 | Discharge: 2012-01-09 | Disposition: A | Discharge status: Home / Self Care | Payer: Medicare Other | Source: Ambulatory Visit | Attending: Surgery | Admitting: Surgery

## 2012-01-09 DIAGNOSIS — N6089 Other benign mammary dysplasias of unspecified breast: Secondary | ICD-10-CM | POA: Insufficient documentation

## 2012-01-09 DIAGNOSIS — E785 Hyperlipidemia, unspecified: Secondary | ICD-10-CM | POA: Insufficient documentation

## 2012-01-09 DIAGNOSIS — N6099 Unspecified benign mammary dysplasia of unspecified breast: Secondary | ICD-10-CM | POA: Insufficient documentation

## 2012-01-09 HISTORY — DX: Other seasonal allergic rhinitis: J30.2

## 2012-01-09 HISTORY — PX: BREAST BIOPSY: SHX20

## 2012-01-09 LAB — URINALYSIS W MICROSCOPIC + REFLEX CULTURE
Bacteria, UA: NONE SEEN
Bilirubin Urine: NEGATIVE
Glucose, UA: NEGATIVE mg/dL
Hgb urine dipstick: NEGATIVE
Ketones, ur: NEGATIVE mg/dL
Protein, ur: NEGATIVE mg/dL
Squamous Epithelial / LPF: NONE SEEN

## 2012-01-09 SURGERY — BREAST BIOPSY WITH NEEDLE LOCALIZATION
Anesthesia: General | Site: Breast | Laterality: Right | Wound class: Clean

## 2012-01-09 MED ORDER — LIDOCAINE HCL (CARDIAC) 20 MG/ML IV SOLN
INTRAVENOUS | Status: DC | PRN
  Administered 2012-01-09: 50 mg via INTRAVENOUS

## 2012-01-09 MED ORDER — FENTANYL CITRATE 0.05 MG/ML IJ SOLN
INTRAMUSCULAR | Status: DC | PRN
  Administered 2012-01-09: 100 ug via INTRAVENOUS

## 2012-01-09 MED ORDER — CEFAZOLIN SODIUM-DEXTROSE 2-3 GM-% IV SOLR
2.0000 g | INTRAVENOUS | Status: AC
  Administered 2012-01-09: 2 g via INTRAVENOUS

## 2012-01-09 MED ORDER — ACETAMINOPHEN 10 MG/ML IV SOLN
1000.0000 mg | Freq: Once | INTRAVENOUS | Status: AC
  Administered 2012-01-09: 1000 mg via INTRAVENOUS

## 2012-01-09 MED ORDER — ONDANSETRON HCL 4 MG/2ML IJ SOLN
INTRAMUSCULAR | Status: DC | PRN
  Administered 2012-01-09: 4 mg via INTRAVENOUS

## 2012-01-09 MED ORDER — HYDROMORPHONE HCL PF 1 MG/ML IJ SOLN: 0.2500 mg | INTRAMUSCULAR | Status: DC | PRN

## 2012-01-09 MED ORDER — MIDAZOLAM HCL 5 MG/5ML IJ SOLN
INTRAMUSCULAR | Status: DC | PRN
  Administered 2012-01-09: 2 mg via INTRAVENOUS

## 2012-01-09 MED ORDER — LACTATED RINGERS IV SOLN
INTRAVENOUS | Status: DC
  Administered 2012-01-09 (×2): via INTRAVENOUS

## 2012-01-09 MED ORDER — PROPOFOL 10 MG/ML IV EMUL
INTRAVENOUS | Status: DC | PRN
  Administered 2012-01-09: 150 mg via INTRAVENOUS

## 2012-01-09 MED ORDER — BUPIVACAINE HCL (PF) 0.5 % IJ SOLN
INTRAMUSCULAR | Status: DC | PRN
  Administered 2012-01-09: 10 mL

## 2012-01-09 MED ORDER — DEXAMETHASONE SODIUM PHOSPHATE 4 MG/ML IJ SOLN
INTRAMUSCULAR | Status: DC | PRN
  Administered 2012-01-09: 10 mg via INTRAVENOUS

## 2012-01-09 MED ORDER — HYDROCODONE-ACETAMINOPHEN 10-325 MG PO TABS: 1.0000 | ORAL_TABLET | ORAL | Status: AC | PRN

## 2012-01-09 MED ORDER — DROPERIDOL 2.5 MG/ML IJ SOLN
INTRAMUSCULAR | Status: DC | PRN
  Administered 2012-01-09: 0.625 mg via INTRAVENOUS

## 2012-01-09 MED ORDER — CEFAZOLIN SODIUM 1-5 GM-% IV SOLN: 1.0000 g | INTRAVENOUS | Status: DC

## 2012-01-09 SURGICAL SUPPLY — 38 items
APL SKNCLS STERI-STRIP NONHPOA (GAUZE/BANDAGES/DRESSINGS) ×1
BENZOIN TINCTURE PRP APPL 2/3 (GAUZE/BANDAGES/DRESSINGS) ×2 IMPLANT
BLADE SURG 15 STRL LF DISP TIS (BLADE) ×1 IMPLANT
BLADE SURG 15 STRL SS (BLADE) ×2
CANISTER SUCTION 1200CC (MISCELLANEOUS) IMPLANT
CHLORAPREP W/TINT 26ML (MISCELLANEOUS) ×2 IMPLANT
CLIP TI WIDE RED SMALL 6 (CLIP) IMPLANT
CLOTH BEACON ORANGE TIMEOUT ST (SAFETY) ×2 IMPLANT
COVER MAYO STAND STRL (DRAPES) ×2 IMPLANT
COVER TABLE BACK 60X90 (DRAPES) ×2 IMPLANT
DECANTER SPIKE VIAL GLASS SM (MISCELLANEOUS) IMPLANT
DEVICE DUBIN W/COMP PLATE 8390 (MISCELLANEOUS) ×1 IMPLANT
DRAPE PED LAPAROTOMY (DRAPES) ×2 IMPLANT
DRAPE UTILITY XL STRL (DRAPES) ×2 IMPLANT
ELECT REM PT RETURN 9FT ADLT (ELECTROSURGICAL) ×2
ELECTRODE REM PT RTRN 9FT ADLT (ELECTROSURGICAL) ×1 IMPLANT
GAUZE SPONGE 4X4 12PLY STRL LF (GAUZE/BANDAGES/DRESSINGS) ×1 IMPLANT
GLOVE BIOGEL PI IND STRL 7.0 (GLOVE) IMPLANT
GLOVE BIOGEL PI INDICATOR 7.0 (GLOVE) ×1
GLOVE ECLIPSE 6.5 STRL STRAW (GLOVE) ×1 IMPLANT
GLOVE SURG ORTHO 8.0 STRL STRW (GLOVE) ×2 IMPLANT
GOWN PREVENTION PLUS XLARGE (GOWN DISPOSABLE) ×2 IMPLANT
GOWN PREVENTION PLUS XXLARGE (GOWN DISPOSABLE) ×2 IMPLANT
NDL HYPO 25X1 1.5 SAFETY (NEEDLE) ×1 IMPLANT
NEEDLE HYPO 25X1 1.5 SAFETY (NEEDLE) ×2 IMPLANT
NS IRRIG 1000ML POUR BTL (IV SOLUTION) ×2 IMPLANT
PACK BASIN DAY SURGERY FS (CUSTOM PROCEDURE TRAY) ×2 IMPLANT
PENCIL BUTTON HOLSTER BLD 10FT (ELECTRODE) ×2 IMPLANT
SLEEVE SCD COMPRESS KNEE MED (MISCELLANEOUS) ×1 IMPLANT
STRIP CLOSURE SKIN 1/2X4 (GAUZE/BANDAGES/DRESSINGS) ×2 IMPLANT
SUT MNCRL AB 4-0 PS2 18 (SUTURE) ×2 IMPLANT
SUT VICRYL 4-0 PS2 18IN ABS (SUTURE) IMPLANT
SYR CONTROL 10ML LL (SYRINGE) ×2 IMPLANT
TOWEL OR 17X24 6PK STRL BLUE (TOWEL DISPOSABLE) ×4 IMPLANT
TOWEL OR NON WOVEN STRL DISP B (DISPOSABLE) ×1 IMPLANT
TUBE CONNECTING 20X1/4 (TUBING) IMPLANT
WATER STERILE IRR 1000ML POUR (IV SOLUTION) ×1 IMPLANT
YANKAUER SUCT BULB TIP NO VENT (SUCTIONS) IMPLANT

## 2012-01-09 NOTE — Anesthesia Procedure Notes (Signed)
Procedure Name: LMA Insertion Date/Time: 01/09/2012 2:17 PM Performed by: Zenia Resides D Pre-anesthesia Checklist: Patient identified, Emergency Drugs available, Suction available, Patient being monitored and Timeout performed Patient Re-evaluated:Patient Re-evaluated prior to inductionOxygen Delivery Method: Circle System Utilized Preoxygenation: Pre-oxygenation with 100% oxygen Intubation Type: IV induction Ventilation: Mask ventilation without difficulty LMA: LMA inserted LMA Size: 4.0 Grade View: Grade I Number of attempts: 1 Airway Equipment and Method: bite block Placement Confirmation: positive ETCO2 and breath sounds checked- equal and bilateral Tube secured with: Tape Dental Injury: Teeth and Oropharynx as per pre-operative assessment

## 2012-01-09 NOTE — Interval H&P Note (Signed)
History and Physical Interval Note:  01/09/2012 1:58 PM  [name]  has presented today for surgery, with the diagnosis of atypical ductal hyperplasia.  The various methods of treatment have been discussed with the patient and family. After consideration of risks, benefits and other options for treatment, the patient has consented to    Procedure(s) (LRB): BREAST BIOPSY WITH NEEDLE LOCALIZATION (Right) as a surgical intervention  The patients' history has been reviewed, patient examined, no change in status, stable for surgery.  I have reviewed the patients' chart and labs.  Questions were answered to the patient's satisfaction.    Velora Heckler, MD, [facility] Office: [phone]   Suhas Estis Judie Petit

## 2012-01-09 NOTE — Anesthesia Preprocedure Evaluation (Addendum)
Anesthesia Evaluation  Patient identified by MRN, date of birth, ID band Patient awake    Reviewed: Allergy & Precautions, H&P , NPO status , Patient's Chart, lab work & pertinent test results  Airway Mallampati: III TM Distance: >3 FB Neck ROM: Full    Dental No notable dental hx. (+) Teeth Intact and Dental Advisory Given   Pulmonary neg pulmonary ROS,  breath sounds clear to auscultation  Pulmonary exam normal       Cardiovascular negative cardio ROS  Rhythm:Regular Rate:Normal     Neuro/Psych  Neuromuscular disease negative psych ROS   GI/Hepatic negative GI ROS, Neg liver ROS,   Endo/Other  negative endocrine ROS  Renal/GU negative Renal ROS  negative genitourinary   Musculoskeletal   Abdominal   Peds  Hematology negative hematology ROS (+)   Anesthesia Other Findings   Reproductive/Obstetrics negative OB ROS                          Anesthesia Physical Anesthesia Plan  ASA: II  Anesthesia Plan: General   Post-op Pain Management:    Induction: Intravenous  Airway Management Planned: LMA  Additional Equipment:   Intra-op Plan:   Post-operative Plan: Extubation in OR  Informed Consent: I have reviewed the patients History and Physical, chart, labs and discussed the procedure including the risks, benefits and alternatives for the proposed anesthesia with the patient or authorized representative who has indicated his/her understanding and acceptance.   Dental advisory given  Plan Discussed with: CRNA  Anesthesia Plan Comments:         Anesthesia Quick Evaluation

## 2012-01-09 NOTE — Discharge Instructions (Signed)

## 2012-01-09 NOTE — Transfer of Care (Signed)
Immediate Anesthesia Transfer of Care Note  Patient: [name]  Procedure(s) Performed: Procedure(s) (LRB): BREAST BIOPSY WITH NEEDLE LOCALIZATION (Right)  Patient Location: PACU  Anesthesia Type: General  Level of Consciousness: awake, alert  and oriented  Airway & Oxygen Therapy: Patient Spontanous Breathing and Patient connected to face mask oxygen  Post-op Assessment: Report given to PACU RN and Post -op Vital signs reviewed and stable  Post vital signs: Reviewed and stable  Complications: No apparent anesthesia complications

## 2012-01-09 NOTE — Anesthesia Postprocedure Evaluation (Signed)
  Anesthesia Post-op Note  Patient: [name]  Procedure(s) Performed: Procedure(s) (LRB): BREAST BIOPSY WITH NEEDLE LOCALIZATION (Right)  Patient Location: PACU  Anesthesia Type: General  Level of Consciousness: awake  Airway and Oxygen Therapy: Patient Spontanous Breathing and Patient connected to face mask oxygen  Post-op Pain: none  Post-op Assessment: Post-op Vital signs reviewed, Patient's Cardiovascular Status Stable, Respiratory Function Stable, Patent Airway and No signs of Nausea or vomiting  Post-op Vital Signs: Reviewed and stable  Complications: No apparent anesthesia complications

## 2012-01-09 NOTE — H&P (View-Only) (Signed)
Chief Complaint  Patient presents with  . New Evaluation    Breast - atypical hyperplasia for excision - referral from Dr. 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 Patient had routine screening mammogram in early March 2013. This showed a cluster of calcifications in the right breast. The patient underwent additional views and stereotactic core needle biopsy. 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 Patient is referred at this time for wire localized excision.  Patient has no prior history of breast disease. She has had no prior breast biopsies. There is no immediate family member with a history of breast cancer.  Past Medical History  Diagnosis Date  . Hyperlipidemia   . Microcalcifications of the breast     atypical ductal hyperplasia with calcifications  . Breast lobular neoplasia     atypical  . Fibrocystic breast changes   . Osteopenia      Current Outpatient Prescriptions  Medication Sig Dispense Refill  . Calcium-Magnesium 250-125 MG TABS Take by mouth daily.        . Cholecalciferol (VITAMIN D) 1000 UNITS capsule Take 1,000 Units by mouth daily.        . ergocalciferol (VITAMIN D2) 50000 UNITS capsule Take 50,000 Units by mouth once a week.        . fluticasone (FLONASE) 50 MCG/ACT nasal spray Place 2 sprays into the nose daily.  16 g  11  . loratadine (CLARITIN) 10 MG tablet Take 10 mg by mouth daily.        . Magnesium 500 MG CAPS Take by mouth daily.        . Multiple Vitamin (MULTIVITAMIN) tablet Take 1 tablet by mouth daily.        . Omega-3 Fatty Acids (FISH OIL) 1000 MG CAPS Take by mouth daily.        Bertram Gala Glycol-Propyl Glycol (SYSTANE) 0.4-0.3 % SOLN Apply to eye. 2-3 x daily          Allergies  Allergen Reactions  . Codeine Rash     Family History  Problem Relation Age of  Onset  . Alcohol abuse Mother   . Cancer Father     bone marrow cancer  . Alcohol abuse Father   . Depression Son     Recovering alcoholic  . Depression Maternal Aunt   . Cancer Maternal Uncle     prostate cancer  . Diabetes Paternal Uncle   . Heart attack Paternal Uncle 51  . Cancer Paternal Grandmother     skin cancer     History   Social History  . Marital Status: Divorced    Spouse Name: N/A    Number of Children: N/A  . Years of Education: N/A   Social History Main Topics  . Smoking status: Never Smoker   . Smokeless tobacco: Never Used  . Alcohol Use: 1.2 oz/week    2 Glasses of wine per week     socially 1 - 2 glasses of wine per week  . Drug Use: No  . Sexually Active: None   Other Topics Concern  . None   Social History Narrative  . None     REVIEW OF SYSTEMS - PERTINENT POSITIVES ONLY: Brief right breast nipple discharge after childbirth.  No other significant history of breast disease.  EXAM: Filed Vitals:   12/23/11 1029  BP: 128/76  Pulse: 70  Temp: 97.4 F (36.3 C)  Resp: 18    HEENT: normocephalic; pupils equal and reactive; sclerae clear; dentition good; mucous membranes moist NECK:  symmetric on extension; no palpable anterior or posterior cervical lymphadenopathy; no supraclavicular masses; no tenderness CHEST: clear to auscultation bilaterally without rales, rhonchi, or wheezes BREAST: Right breast with normal nipple areolar complex. Well-healed biopsy site at 12:00 position right upper breast. Slight nodularity to breast parenchyma without dominant or discrete mass. Right axilla is free of adenopathy. Left breast shows normal nipple areolar complex. Slight nodularity of the breast parenchyma without dominant or discrete mass. Left axilla is free of adenopathy. 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 I think she will require wire localized excisional biopsy for definitive diagnosis and management of atypical ductal hyperplasia and atypical lobular hyperplasia. This can be performed as an outpatient procedure. We discussed risk and benefits as well as the possibility of additional surgery. She understands and wishes to proceed.  The risks and benefits of the procedure have been discussed at length with the patient.  The patient understands the proposed procedure, potential alternative treatments, and the course of recovery to be expected.  All of the patient's questions have been answered at this time.  The patient wishes to proceed with surgery.  Velora Heckler, MD, FACS General & Endocrine Surgery [facility]   Visit Diagnoses: 1. Atypical ductal hyperplasia of breast, right     Primary Care Physician: Marga Melnick, MD, MD  Radiology:  Dr. Jeralyn Ruths GYN:  Dr. Edyth Gunnels

## 2012-01-09 NOTE — Brief Op Note (Signed)
01/09/2012  3:06 PM  PATIENT:  [name]  66 y.o. female  PRE-OPERATIVE DIAGNOSIS:  Atypical ductal hyperplasia right breast  POST-OPERATIVE DIAGNOSIS:  Atypical ductal hyperplasia right breast  PROCEDURE:  Procedure(s) (LRB): BREAST BIOPSY WITH NEEDLE LOCALIZATION (Right)  SURGEON:  Surgeon(s) and Role:    * Velora Heckler, MD - Primary   ANESTHESIA:   general  EBL:     BLOOD ADMINISTERED:none  DRAINS: none   LOCAL MEDICATIONS USED:  MARCAINE     SPECIMEN:  Excision  DISPOSITION OF SPECIMEN:  PATHOLOGY  COUNTS:  YES  TOURNIQUET:  * No tourniquets in log *  DICTATION: .Other Dictation: Dictation Number [id number]  PLAN OF CARE: Discharge to home after PACU  PATIENT DISPOSITION:  PACU - hemodynamically stable.   Delay start of Pharmacological VTE agent (>24hrs) due to surgical blood loss or risk of bleeding: yes  Velora Heckler, MD, [facility] Office: [phone]

## 2012-01-10 ENCOUNTER — Encounter (HOSPITAL_BASED_OUTPATIENT_CLINIC_OR_DEPARTMENT_OTHER): Payer: Self-pay | Admitting: Surgery

## 2012-01-13 NOTE — Progress Notes (Signed)
Quick Note:  Please contact patient with benign path results. TMG ______ 

## 2012-01-13 NOTE — Op Note (Signed)
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  PROCEDURE:  Right breast excisional biopsy with needle localization.  SURGEON:  Velora Heckler, MD, FACS  ANESTHESIA:  General, per Zenon Mayo, MD  ESTIMATED BLOOD LOSS:  Minimal.  PREPARATION:  ChloraPrep.  COMPLICATIONS:  None.  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  She underwent stereotactic biopsy.  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  The patient now comes to surgery for wire localized excision.  BODY OF REPORT:  Procedure was done in OR #5 at the [facility].  The patient was brought to the operating room, placed in a supine position on the operating room table.  Wire localization had been performed by Dr. Yolanda Bonine.  Films were reviewed.  The patient was prepped and draped after induction of anesthesia.  After ascertaining that an adequate level of anesthesia had been achieved, a curvilinear incision was made in the upper right breast at the site of guidewire placement.  Dissection was carried into the subcutaneous tissues and hemostasis obtained with the electrocautery.  Using the electrocautery for hemostasis, a core of breast tissue surrounding the guidewire is excised down to the chest wall.  At no time was the needle exposed.  The specimen was completely excised.  It was marked with  sutures as to the superior, inferior, and lateral margins of the resection.  Specimen mammogram was obtained which shows that the marking clip was within the specimen as are the calcifications in question.  Specimen was then submitted to pathology for review.  Good hemostasis was noted throughout the wound.  Subcutaneous tissues were closed with interrupted 3-0 Vicryl sutures.  Skin was anesthetized with local anesthetic.  Skin edges were reapproximated with a running 4- 0 Monocryl subcuticular suture.  Wound was washed and dried and benzoin and Steri- Strips were applied.  Sterile dressings were applied.  The patient was awakened from anesthesia and brought to the recovery room.  The patient tolerated the procedure well.   Velora Heckler, MD, FACS   TMG/MEDQ  D:  01/09/2012  T:  01/09/2012  Job:  [id number]  cc:   Jeralyn Ruths, MD Titus Dubin. Alwyn Ren, MD,FACP,FCCP Rande Brunt Eda Paschal, M.D.

## 2012-01-16 ENCOUNTER — Encounter: Payer: Self-pay | Admitting: Obstetrics and Gynecology

## 2012-01-16 ENCOUNTER — Telehealth (INDEPENDENT_AMBULATORY_CARE_PROVIDER_SITE_OTHER): Payer: Self-pay

## 2012-01-16 NOTE — Telephone Encounter (Signed)
LMOM returning pts call

## 2012-01-17 ENCOUNTER — Telehealth (INDEPENDENT_AMBULATORY_CARE_PROVIDER_SITE_OTHER): Payer: Self-pay

## 2012-01-17 NOTE — Telephone Encounter (Signed)
Pt notified of path result and to keep appt 5-6 with Dr Gerrit Friends,

## 2012-01-20 ENCOUNTER — Encounter (INDEPENDENT_AMBULATORY_CARE_PROVIDER_SITE_OTHER): Payer: Self-pay | Admitting: Surgery

## 2012-01-20 ENCOUNTER — Ambulatory Visit (INDEPENDENT_AMBULATORY_CARE_PROVIDER_SITE_OTHER): Payer: Medicare Other | Admitting: Surgery

## 2012-01-20 ENCOUNTER — Encounter (INDEPENDENT_AMBULATORY_CARE_PROVIDER_SITE_OTHER): Payer: Medicare Other | Admitting: Surgery

## 2012-01-20 VITALS — BP 120/78 | HR 62 | Temp 97.6°F | Resp 14 | Ht 64.0 in | Wt 200.0 lb

## 2012-01-20 DIAGNOSIS — N6089 Other benign mammary dysplasias of unspecified breast: Secondary | ICD-10-CM

## 2012-01-20 DIAGNOSIS — N6099 Unspecified benign mammary dysplasia of unspecified breast: Secondary | ICD-10-CM

## 2012-01-20 NOTE — Progress Notes (Signed)
Visit Diagnoses: 1. Atypical ductal hyperplasia of breast, right     HISTORY: Patient returns for her first postoperative visit having undergone right breast excisional biopsy with wire localization on January 10, 2012. Final pathology showed atypical ductal hyperplasia. No malignancy was identified. Fibrocystic changes were identified. Calcifications were present. Surgical margins were widely negative.  EXAM: Surgical wound is healing nicely. Mild ecchymosis. No sign of seroma. No sign of infection.  IMPRESSION: Atypical ductal hyperplasia, excised, negative margins  PLAN: Patient will begin applying topical creams to her incision. She will return in 6 weeks for final wound check. She will likely require followup right breast mammography in 6 months.  Velora Heckler, MD, FACS General & Endocrine Surgery [facility]

## 2012-01-20 NOTE — Patient Instructions (Signed)
  COCOA BUTTER & VITAMIN E CREAM  (Palmer's or other brand)  Apply cocoa butter/vitamin E cream to your incision 2 - 3 times daily.  Massage cream into incision for one minute with each application.  Use sunscreen (50 SPF or higher) for first 6 months after surgery if area is exposed to sun.  You may substitute Mederma or other scar reducing creams as desired.   

## 2012-02-06 ENCOUNTER — Ambulatory Visit (AMBULATORY_SURGERY_CENTER): Payer: Medicare Other | Admitting: *Deleted

## 2012-02-06 VITALS — Ht 64.5 in | Wt 200.4 lb

## 2012-02-06 DIAGNOSIS — Z1211 Encounter for screening for malignant neoplasm of colon: Secondary | ICD-10-CM

## 2012-02-06 MED ORDER — PEG-KCL-NACL-NASULF-NA ASC-C 100 G PO SOLR: ORAL | Status: DC

## 2012-02-18 ENCOUNTER — Other Ambulatory Visit: Payer: Self-pay | Admitting: Obstetrics and Gynecology

## 2012-02-18 DIAGNOSIS — M8580 Other specified disorders of bone density and structure, unspecified site: Secondary | ICD-10-CM

## 2012-02-20 ENCOUNTER — Encounter: Payer: Self-pay | Admitting: Gastroenterology

## 2012-02-20 ENCOUNTER — Ambulatory Visit (AMBULATORY_SURGERY_CENTER): Payer: Medicare Other | Admitting: Gastroenterology

## 2012-02-20 VITALS — BP 119/60 | HR 63 | Temp 96.4°F | Resp 16 | Ht 64.5 in | Wt 200.0 lb

## 2012-02-20 DIAGNOSIS — D126 Benign neoplasm of colon, unspecified: Secondary | ICD-10-CM

## 2012-02-20 DIAGNOSIS — Z1211 Encounter for screening for malignant neoplasm of colon: Secondary | ICD-10-CM

## 2012-02-20 MED ORDER — SODIUM CHLORIDE 0.9 % IV SOLN: 500.0000 mL | INTRAVENOUS | Status: DC

## 2012-02-20 NOTE — Progress Notes (Addendum)
Pt with eyes open at the end of procedure.  Pt states that she is comfortable.

## 2012-02-20 NOTE — Op Note (Signed)
[facility] [address] [place], [place]  [address]  COLONOSCOPY PROCEDURE REPORT  PATIENT:  Emma, Malone  MR#:  [id number] BIRTHDATE:  1946/03/26, 65 yrs. old  GENDER:  female ENDOSCOPIST:  Judie Petit T. Russella Dar, MD, [facility]  PROCEDURE DATE:  02/20/2012 PROCEDURE:  Colonoscopy with snare polypectomy ASA CLASS:  Class II INDICATIONS:  1) Routine Risk Screening MEDICATIONS:   These medications were titrated to patient response per physician's verbal order, Fentanyl 50 mcg IV, Versed 7 mg IV DESCRIPTION OF PROCEDURE:   After the risks benefits and alternatives of the procedure were thoroughly explained, informed consent was obtained.  Digital rectal exam was performed and revealed no abnormalities.   The LB [id number] endoscope was introduced through the anus and advanced to the cecum, which was identified by both the appendix and ileocecal valve, without limitations.  The quality of the prep was good, using MoviPrep. The instrument was then slowly withdrawn as the colon was fully examined. <<PROCEDUREIMAGES>> FINDINGS:  A sessile polyp was found in the cecum. It was 5 mm in size. Polyp was snared without cautery. Retrieval was successful. A sessile polyp was found in the ascending colon. It was 6 mm in size. Polyp was snared, then cauterized with monopolar cautery. Retrieval was successful. A sessile polyp was found in the mid transverse colon. It was 8 mm in size. Polyp was snared, then cauterized with monopolar cautery. Retrieval was successful. A sessile polyp was found in the descending colon. It was 7 mm in size. Polyp was snared, then cauterized with monopolar cautery. Retrieval was successful. A sessile polyp was found in the sigmoid colon. It was 5 mm in size. Polyp was snared without cautery. Retrieval was successful. Otherwise normal colonoscopy without other polyps, masses, vascular ectasias, or inflammatory changes. Retroflexed views in the rectum revealed  internal hemorrhoids, small.  The time to cecum =  3.25  minutes. The scope was then withdrawn (time =  16  min) from the patient and the procedure completed.  COMPLICATIONS:  None  ENDOSCOPIC IMPRESSION: 1) 5 mm sessile polyp in the cecum 2) 6 mm sessile polyp in the ascending colon 3) 8 mm sessile polyp in the mid transverse colon 4) 7 mm sessile polyp in the descending colon 5) 5 mm sessile polyp in the sigmoid colon 6) Internal hemorrhoids  RECOMMENDATIONS: 1) Hold aspirin, aspirin products, and anti-inflammatory medication for 2 weeks 2) Await pathology results 3) Repeat Colonoscopy in 3 years if 3 or more polyps are adenomatous, 5 years if 1 or 2 are adenomatous, otherwise 10 years   Skye Plamondon T. Russella Dar, MD, [facility]  n. eSIGNED:   Venita Lick. Deric Bocock at 02/20/2012 12:06 PM  Morrie Sheldon, [phone]

## 2012-02-20 NOTE — Patient Instructions (Signed)
Findings:  Polyps and internal Hemorrhoids Recommendations:  HOLD ASPIRIN PRODUCTS AND ANTI-INFLAMMATORY MEDICATIONS FOR 2 WEEKS.  YOU HAD AN ENDOSCOPIC PROCEDURE TODAY AT THE [facility]: Refer to the procedure report that was given to you for any specific questions about what was found during the examination.  If the procedure report does not answer your questions, please call your gastroenterologist to clarify.  If you requested that your care partner not be given the details of your procedure findings, then the procedure report has been included in a sealed envelope for you to review at your convenience later.  YOU SHOULD EXPECT: Some feelings of bloating in the abdomen. Passage of more gas than usual.  Walking can help get rid of the air that was put into your GI tract during the procedure and reduce the bloating. If you had a lower endoscopy (such as a colonoscopy or flexible sigmoidoscopy) you may notice spotting of blood in your stool or on the toilet paper. If you underwent a bowel prep for your procedure, then you may not have a normal bowel movement for a few days.  DIET: Your first meal following the procedure should be a light meal and then it is ok to progress to your normal diet.  A half-sandwich or bowl of soup is an example of a good first meal.  Heavy or fried foods are harder to digest and may make you feel nauseous or bloated.  Likewise meals heavy in dairy and vegetables can cause extra gas to form and this can also increase the bloating.  Drink plenty of fluids but you should avoid alcoholic beverages for 24 hours.  ACTIVITY: Your care partner should take you home directly after the procedure.  You should plan to take it easy, moving slowly for the rest of the day.  You can resume normal activity the day after the procedure however you should NOT DRIVE or use heavy machinery for 24 hours (because of the sedation medicines used during the test).    SYMPTOMS TO REPORT  IMMEDIATELY: A gastroenterologist can be reached at any hour.  During normal business hours, 8:30 AM to 5:00 PM Monday through Friday, call [phone].  After hours and on weekends, please call the GI answering service at [phone] who will take a message and have the physician on call contact you.   Following lower endoscopy (colonoscopy or flexible sigmoidoscopy):  Excessive amounts of blood in the stool  Significant tenderness or worsening of abdominal pains  Swelling of the abdomen that is new, acute  Fever of 100F or higher  Following upper endoscopy (EGD)  Vomiting of blood or coffee ground material  New chest pain or pain under the shoulder blades  Painful or persistently difficult swallowing  New shortness of breath  Fever of 100F or higher  Black, tarry-looking stools  FOLLOW UP: If any biopsies were taken you will be contacted by phone or by letter within the next 1-3 weeks.  Call your gastroenterologist if you have not heard about the biopsies in 3 weeks.  Our staff will call the home number listed on your records the next business day following your procedure to check on you and address any questions or concerns that you may have at that time regarding the information given to you following your procedure. This is a courtesy call and so if there is no answer at the home number and we have not heard from you through the emergency physician on call, we will  assume that you have returned to your regular daily activities without incident.  SIGNATURES/CONFIDENTIALITY: You and/or your care partner have signed paperwork which will be entered into your electronic medical record.  These signatures attest to the fact that that the information above on your After Visit Summary has been reviewed and is understood.  Full responsibility of the confidentiality of this discharge information lies with you and/or your care-partner.   Please follow all discharge instructions given to you  by the recovery room nurse. If you have any questions or problems after discharge please call one of the numbers listed above. You will receive a phone call in the am to see how you are doing and answer any questions you may have. Thank you for choosing [facility] for your health care needs.

## 2012-02-20 NOTE — Progress Notes (Signed)
Patient did not experience any of the following events: a burn prior to discharge; a fall within the facility; wrong site/side/patient/procedure/implant event; or a hospital transfer or hospital admission upon discharge from the facility. (G8907) Patient did not have preoperative order for IV antibiotic SSI prophylaxis. (G8918)  

## 2012-02-21 ENCOUNTER — Telehealth: Payer: Self-pay | Admitting: *Deleted

## 2012-02-21 NOTE — Telephone Encounter (Signed)
  Follow up Call-  Call back number 02/20/2012  Post procedure Call Back phone  # [phone]  Permission to leave phone message Yes     Patient questions:  Do you have a fever, pain , or abdominal swelling? no Pain Score  0 *  Have you tolerated food without any problems? yes  Have you been able to return to your normal activities? yes  Do you have any questions about your discharge instructions: Diet   no Medications  no Follow up visit  no  Do you have questions or concerns about your Care? no  Actions: * If pain score is 4 or above: No action needed, pain <4.

## 2012-02-26 ENCOUNTER — Encounter: Payer: Self-pay | Admitting: Gastroenterology

## 2012-02-27 ENCOUNTER — Ambulatory Visit (INDEPENDENT_AMBULATORY_CARE_PROVIDER_SITE_OTHER): Payer: Medicare Other

## 2012-02-27 DIAGNOSIS — M899 Disorder of bone, unspecified: Secondary | ICD-10-CM

## 2012-02-27 DIAGNOSIS — M8580 Other specified disorders of bone density and structure, unspecified site: Secondary | ICD-10-CM

## 2012-03-05 ENCOUNTER — Ambulatory Visit (INDEPENDENT_AMBULATORY_CARE_PROVIDER_SITE_OTHER): Payer: Medicare Other | Admitting: Surgery

## 2012-03-05 ENCOUNTER — Encounter (INDEPENDENT_AMBULATORY_CARE_PROVIDER_SITE_OTHER): Payer: Self-pay | Admitting: Surgery

## 2012-03-05 VITALS — BP 132/84 | HR 71 | Temp 97.0°F | Resp 16 | Ht 64.5 in | Wt 199.6 lb

## 2012-03-05 DIAGNOSIS — N6089 Other benign mammary dysplasias of unspecified breast: Secondary | ICD-10-CM

## 2012-03-05 DIAGNOSIS — N6099 Unspecified benign mammary dysplasia of unspecified breast: Secondary | ICD-10-CM

## 2012-03-05 NOTE — Progress Notes (Signed)
General Surgery [facility]  Visit Diagnoses: 1. Atypical ductal hyperplasia of breast, right     HISTORY: Patient returns for postoperative visit having undergone right breast wire localized excisional biopsy for atypical ductal hyperplasia. Margins were negative.  EXAM: Surgical wound on the right breast is well-healed. No sign of seroma. No sign of infection.  IMPRESSION: Atypical ductal hyperplasia, right breast, status post excision  PLAN: Patient will have a followup mammogram of the right breast in 6 months to establish a new baseline. She will return to see me a few weeks following that study to review the results and to repeat her breast examination.  Velora Heckler, MD, FACS General & Endocrine Surgery [facility]

## 2012-03-05 NOTE — Addendum Note (Signed)
Addended by: Joanette Gula on: 03/05/2012 09:55 AM   Modules accepted: Orders

## 2012-03-05 NOTE — Patient Instructions (Signed)
  COCOA BUTTER & VITAMIN E CREAM  (Palmer's or other brand)  Apply cocoa butter/vitamin E cream to your incision 2 - 3 times daily.  Massage cream into incision for one minute with each application.  Use sunscreen (50 SPF or higher) for first 6 months after surgery if area is exposed to sun.  You may substitute Mederma or other scar reducing creams as desired.   

## 2012-03-06 ENCOUNTER — Encounter: Payer: Self-pay | Admitting: Obstetrics and Gynecology

## 2012-04-02 ENCOUNTER — Encounter (INDEPENDENT_AMBULATORY_CARE_PROVIDER_SITE_OTHER): Payer: Self-pay

## 2012-05-08 ENCOUNTER — Other Ambulatory Visit: Payer: Self-pay | Admitting: Obstetrics and Gynecology

## 2012-08-05 ENCOUNTER — Other Ambulatory Visit: Payer: Self-pay | Admitting: Obstetrics and Gynecology

## 2012-08-05 DIAGNOSIS — R928 Other abnormal and inconclusive findings on diagnostic imaging of breast: Secondary | ICD-10-CM

## 2012-08-07 ENCOUNTER — Encounter (INDEPENDENT_AMBULATORY_CARE_PROVIDER_SITE_OTHER): Payer: Self-pay

## 2012-09-16 DIAGNOSIS — C801 Malignant (primary) neoplasm, unspecified: Secondary | ICD-10-CM

## 2012-09-16 HISTORY — DX: Malignant (primary) neoplasm, unspecified: C80.1

## 2013-01-12 ENCOUNTER — Encounter: Payer: Self-pay | Admitting: Gynecology

## 2013-01-12 ENCOUNTER — Ambulatory Visit (INDEPENDENT_AMBULATORY_CARE_PROVIDER_SITE_OTHER): Payer: Medicare Other | Admitting: Gynecology

## 2013-01-12 VITALS — BP 120/78 | Ht 64.0 in | Wt 202.0 lb

## 2013-01-12 DIAGNOSIS — Z8639 Personal history of other endocrine, nutritional and metabolic disease: Secondary | ICD-10-CM

## 2013-01-12 DIAGNOSIS — M949 Disorder of cartilage, unspecified: Secondary | ICD-10-CM

## 2013-01-12 DIAGNOSIS — M8580 Other specified disorders of bone density and structure, unspecified site: Secondary | ICD-10-CM | POA: Insufficient documentation

## 2013-01-12 DIAGNOSIS — R3915 Urgency of urination: Secondary | ICD-10-CM

## 2013-01-12 DIAGNOSIS — M899 Disorder of bone, unspecified: Secondary | ICD-10-CM

## 2013-01-12 DIAGNOSIS — N952 Postmenopausal atrophic vaginitis: Secondary | ICD-10-CM

## 2013-01-12 DIAGNOSIS — N949 Unspecified condition associated with female genital organs and menstrual cycle: Secondary | ICD-10-CM

## 2013-01-12 DIAGNOSIS — N9489 Other specified conditions associated with female genital organs and menstrual cycle: Secondary | ICD-10-CM

## 2013-01-12 DIAGNOSIS — N6089 Other benign mammary dysplasias of unspecified breast: Secondary | ICD-10-CM

## 2013-01-12 DIAGNOSIS — N6099 Unspecified benign mammary dysplasia of unspecified breast: Secondary | ICD-10-CM

## 2013-01-12 MED ORDER — NONFORMULARY OR COMPOUNDED ITEM: Status: DC

## 2013-01-12 MED ORDER — TERBINAFINE HCL 1 % EX SOLN: 1.0000 | Freq: Every day | CUTANEOUS | Status: DC

## 2013-01-12 MED ORDER — VITAMIN D (ERGOCALCIFEROL) 1.25 MG (50000 UNIT) PO CAPS: 50000.0000 [IU] | ORAL_CAPSULE | ORAL | Status: DC

## 2013-01-12 NOTE — Progress Notes (Signed)
[name] 03-03-1946 [id number]   History:    67 y.o.  Presented to the office today with several complaints. She states that she has urgency but no true incontinence. She is not certain if she has nocturia because she gets up at night because of her dog. She has had history of vaginal atrophy for which she had been on Premarin vaginal cream twice a week. She also has history vitamin D deficiency for which she's on vitamin D 50,000 units every other week. Patient with known history of osteopenia with her last bone density study done in 2013 with her lowest T score the distal one third left forearm with a value -2.1.  Patient's primary physician is Dr. Marga Melnick who has been doing her lab work . Please see problem list in medication list for additional detail.  Patient had a right breast excisional biopsy with wire localization in 01/10/2012 by Dr. Darnell Level. 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 Some calcifications were noted. Surgical margins were reported to be normal. Followup mammogram November 2013 benign. Patient reported a normal colonoscopy in 2013. Patient would know prior history of abnormal Pap smear.  Past medical history,surgical history, family history and social history were all reviewed and documented in the EPIC chart.  Gynecologic History No LMP recorded. Patient is postmenopausal. Contraception: post menopausal status Last Pap: 2013. Results were: normal Last mammogram: 2013. Results were: see above  Obstetric History OB History   Grav Para Term Preterm Abortions TAB SAB Ect Mult Living   3 1 1  2     1      # Outc Date GA Lbr Len/2nd Wgt Sex Del Anes PTL Lv   1 TRM            2 ABT            3 ABT                ROS: A ROS was performed and pertinent positives and negatives are included in the history.  GENERAL: No fevers or chills. HEENT: No change in vision, no  earache, sore throat or sinus congestion. NECK: No pain or stiffness. CARDIOVASCULAR: No chest pain or pressure. No palpitations. PULMONARY: No shortness of breath, cough or wheeze. GASTROINTESTINAL: No abdominal pain, nausea, vomiting or diarrhea, melena or bright red blood per rectum. GENITOURINARY: No urinary frequency, urgency, hesitancy or dysuria. MUSCULOSKELETAL: No joint or muscle pain, no back pain, no recent trauma. DERMATOLOGIC: No rash, no itching, no lesions. ENDOCRINE: No polyuria, polydipsia, no heat or cold intolerance. No recent change in weight. HEMATOLOGICAL: No anemia or easy bruising or bleeding. NEUROLOGIC: No headache, seizures, numbness, tingling or weakness. PSYCHIATRIC: No depression, no loss of interest in normal activity or change in sleep pattern.     Exam: chaperone present  BP 120/78  Ht 5\' 4"  (1.626 m)  Wt 202 lb (91.627 kg)  BMI 34.66 kg/m2  Body mass index is 34.66 kg/(m^2).  General appearance : Well developed well nourished female. No acute distress HEENT: Neck supple, trachea midline, no carotid bruits, no thyroidmegaly Lungs: Clear to auscultation, no rhonchi or wheezes, or rib retractions  Heart: Regular rate and rhythm, no murmurs or gallops Breast:Examined in sitting and supine position were symmetrical in appearance, no palpable masses or tenderness,  no skin retraction, no nipple inversion, no nipple discharge, no skin discoloration, no axillary or supraclavicular lymphadenopathy Abdomen: no palpable masses or tenderness, no  rebound or guarding Extremities: no edema or skin discoloration or tenderness  Pelvic:  Bartholin, Urethra, Skene Glands: Within normal limits             Vagina: No gross lesions or discharge  Cervix: No gross lesions or discharge  Uterus  Slightly anteverted, normal size, shape and consistency, non-tender and mobile  Adnexa  Right adnexal fullness  Anus and perineum  normal   Rectovaginal  normal sphincter tone without  palpated masses or tenderness             Hemoccult cards will be provided     Assessment/Plan:  67 y.o. female who was found to have a right adnexal fullness on today's exam. She will return back to the office in one to 2 weeks for followup ultrasound. Because her history vitamin D deficiency will check her vitamin D level today. Additional blood work to be done by her primary physician. She was encouraged to continue to do her monthly self breast examination.patient requesting refill her of her topical estrogen for vaginal atrophy. Estradiol 0.02% 1 mL pre-filled applicator to apply twice a week was prescribed. We discussed importance of regular exercise for osteoporosis prevention. She will submit to the office Hemoccult cards for testing at a later date. Of note her immunizations are up to date. She will need a bone density study next year.   Ok Edwards MD, 2:55 PM 01/12/2013

## 2013-01-13 ENCOUNTER — Telehealth: Payer: Self-pay

## 2013-01-13 NOTE — Telephone Encounter (Signed)
Pharmacy received Rx for Terbinafine HCL 1% Solution e-scribed yesterday.  They called to say this is no longer made in solution and asked if you can change the Rx to something else?

## 2013-01-13 NOTE — Telephone Encounter (Signed)
Check with pharmacy for a generic topical nail antifungal agent . I wanted her to have Lamasil?

## 2013-01-13 NOTE — Telephone Encounter (Signed)
Left message with pharmacy to please advise Dr. Glenetta Hew re: available med.

## 2013-01-14 ENCOUNTER — Other Ambulatory Visit: Payer: Self-pay | Admitting: Gynecology

## 2013-01-14 MED ORDER — CICLOPIROX 8 % EX SOLN: Freq: Every day | CUTANEOUS | Status: DC

## 2013-01-14 NOTE — Telephone Encounter (Signed)
Pharmacy responded with note saying "Penclac (Ciclopirox) is a topical nail antifungal for fingernail and toenail infections.".

## 2013-01-14 NOTE — Telephone Encounter (Signed)
This can be used instead and applied twice a day.

## 2013-01-27 ENCOUNTER — Ambulatory Visit (INDEPENDENT_AMBULATORY_CARE_PROVIDER_SITE_OTHER): Payer: Medicare Other | Admitting: Gynecology

## 2013-01-27 ENCOUNTER — Telehealth: Payer: Self-pay | Admitting: *Deleted

## 2013-01-27 ENCOUNTER — Other Ambulatory Visit: Payer: Self-pay | Admitting: Gynecology

## 2013-01-27 ENCOUNTER — Ambulatory Visit (INDEPENDENT_AMBULATORY_CARE_PROVIDER_SITE_OTHER): Payer: Medicare Other

## 2013-01-27 ENCOUNTER — Encounter: Payer: Self-pay | Admitting: Gynecology

## 2013-01-27 DIAGNOSIS — N9489 Other specified conditions associated with female genital organs and menstrual cycle: Secondary | ICD-10-CM

## 2013-01-27 DIAGNOSIS — R1900 Intra-abdominal and pelvic swelling, mass and lump, unspecified site: Secondary | ICD-10-CM

## 2013-01-27 DIAGNOSIS — N949 Unspecified condition associated with female genital organs and menstrual cycle: Secondary | ICD-10-CM

## 2013-01-27 DIAGNOSIS — N800 Endometriosis of uterus: Secondary | ICD-10-CM

## 2013-01-27 DIAGNOSIS — N838 Other noninflammatory disorders of ovary, fallopian tube and broad ligament: Secondary | ICD-10-CM

## 2013-01-27 DIAGNOSIS — N839 Noninflammatory disorder of ovary, fallopian tube and broad ligament, unspecified: Secondary | ICD-10-CM

## 2013-01-27 DIAGNOSIS — N83339 Acquired atrophy of ovary and fallopian tube, unspecified side: Secondary | ICD-10-CM

## 2013-01-27 LAB — COMPREHENSIVE METABOLIC PANEL
AST: 23 U/L (ref 0–37)
Albumin: 4.2 g/dL (ref 3.5–5.2)
Alkaline Phosphatase: 91 U/L (ref 39–117)
Glucose, Bld: 106 mg/dL — ABNORMAL HIGH (ref 70–99)
Potassium: 4.4 mEq/L (ref 3.5–5.3)
Sodium: 140 mEq/L (ref 135–145)
Total Bilirubin: 0.6 mg/dL (ref 0.3–1.2)
Total Protein: 7.2 g/dL (ref 6.0–8.3)

## 2013-01-27 LAB — CBC WITH DIFFERENTIAL/PLATELET
Basophils Relative: 1 % (ref 0–1)
Eosinophils Absolute: 0.3 10*3/uL (ref 0.0–0.7)
Eosinophils Relative: 5 % (ref 0–5)
Lymphs Abs: 1.5 10*3/uL (ref 0.7–4.0)
MCH: 29 pg (ref 26.0–34.0)
MCHC: 34.4 g/dL (ref 30.0–36.0)
MCV: 84.1 fL (ref 78.0–100.0)
Monocytes Relative: 8 % (ref 3–12)
Neutrophils Relative %: 61 % (ref 43–77)
Platelets: 168 10*3/uL (ref 150–400)
RBC: 5.11 MIL/uL (ref 3.87–5.11)

## 2013-01-27 NOTE — Telephone Encounter (Signed)
Ct scan on 02/01/13 @ 1:45pm at [facility] hospital and chest x-[place] can be done same day. Pt will need to drink contrast at 12 and 1 o'clock prior to Ct scan. I left message for pt to call.

## 2013-01-27 NOTE — Patient Instructions (Addendum)
Ovarian Tumors  The ovaries are small organs that produce eggs in women. They lie on each side of the uterus. Tumors are solid growths on the ovary, not like ovarian cysts that are filled with fluid. They can be cancerous or noncancerous. All solid tumors should be looked at to make sure they are not cancer tumors.   CAUSES   There are no known causes for developing a solid tumor on the ovary. However, there are several risk factors for developing cancerous tumors on the ovary, such as:   Aging.   North American or North European descent.   Personal or family history of ovarian, colon and breast cancer.   Women with BRCA 1 or BRCA 2 genes are at high risk for getting ovarian cancer.   The use of fertility medications to get pregnant may increase the risk for getting ovarian cancer.   Late menopause (after age 50).   Women who become pregnant for the first time at 30 or older.  Having these risk factors does not mean you will get ovarian cancer. However, you should know about them and report any that you have to your caregiver. Also, a woman with none of these risk factors can still get ovarian cancer.  SYMPTOMS   In many cases there are no symptoms. Noncancerous tumors usually have no symptoms but cancerous tumors may have symptoms that are minor and resemble other health problems. The following are symptoms that may be important to diagnosing cancer of the ovary:   Unexplained weight loss.   Increase abdominal size.   Pain in the belly (abdomen).   Pain or pressure in the back and pelvis.   Tiredness.   Abnormal vaginal bleeding.   Loss of appetite.   Frequent urination or pressure on your bladder.   Indigestion, increase gas and bloating.   Painful sexual intercourse.  DIAGNOSIS    During an exam, an abnormal mass may be found in the pelvis. It is important to have a rectovaginal examination to help find pelvic masses, especially in women over 40 years old.   An ultrasound may be done.   X-[place],  CT scan or MRI imaging.   Blood tests.   A Pap test does not help in diagnosing tumors or cancer of the ovary. New screening tests are always being studied to detect early ovarian cancer.  TREATMENT    All solid tumors of the ovary should be evaluated, usually with surgery, to make sure they are not cancerous.   The tumor will be studied in the lab under the microscope to see if it is cancer.   Noncancerous tumors can be removed surgically with or without removing the ovary.   Cancerous tumors usually are removed with the ovary and sometimes both ovaries are removed with the Fallopian tubes, uterus and surrounding lymph nodes to see if the cancer has spread.   Cancerous tumors may also be treated along with the surgery with radiation, chemotherapy or both.   The surgeon should be a gynecology oncologist (cancer specialist in gynecology cancer surgery) and the chemotherapist and radiation therapist should be experienced specialists in their field.  HOME CARE INSTRUCTIONS    Inform your caregiver if you or anyone in your family has had cancer.   Follow your caregiver's advice and recommendations regarding medications and follow up care.   Get a yearly physical and gynecology exams. This includes a rectovaginal exam if you are 40 years old or older.  SEEK MEDICAL CARE IF:      You have any of the above symptoms that have not gone away after a week of treatment.   You are losing weight for no reason.   You feel generally ill.  Document Released: 06/11/2008 Document Revised: 11/25/2011 Document Reviewed: 06/11/2008  Exit[employer] Patient Information 2013 [employer].

## 2013-01-27 NOTE — Progress Notes (Signed)
Patient is a 67 year old who was seen in the office on April 29 as a new patient. Patient is a retired Engineer, civil (consulting). She states that she has urgency but no true incontinence. She is not certain if she has nocturia because she gets up at night because of her dog. She has had history of vaginal atrophy for which she had been on Premarin vaginal cream twice a week. She also has history vitamin D deficiency for which she's on vitamin D 50,000 units every other week. Patient with known history of osteopenia with her last bone density study done in 2013 with her lowest T score the distal one third left forearm with a value -2.1. During the annual exam she was found to have a right adnexal fullness. She was asked to return today for an ultrasound for better assessment. Today's ultrasound as follows:  Uterus measured 8.3 x 4.8 x 3.8 cm with endometrial stripe of 8.9 mm (cystic areas within the myometrium) otherwise endometrial cavity cystic focus within the cavity measuring 4 mm avascular prominent endometrial cavity. The right ovary not seen, right adnexal solid mass with arterial blood flow was seen in the mass measuring 6.9 x 5.9 x 3.9 cm left ovary with solid mass measuring 1.7 x 2.4 x 2.1 cm which was avascular. There was no fluid in the cul-de-sac.  Patient had a right breast excisional biopsy with wire localization in 01/10/2012 by Dr. Darnell Level. 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 Some calcifications were noted. Surgical margins were reported to be normal. Followup mammogram November 2013 benign. Patient reported a normal colonoscopy in 2013. Patient would know prior history of abnormal Pap smear.  Assessment/plan: Incidental finding during routine annual exam of the right adnexal mass with ultrasound features suspicious for malignancy. Patient will be referred to GYN oncologist for further discussion and surgical options. We  will order an oval 1 cancer screening blood tests along with a chest x-Zmuda PA and lateral and CT of the abdomen pelvis with and without contrast. A comprehensive metabolic panel will be drawn today along with a CBC.

## 2013-01-27 NOTE — Telephone Encounter (Signed)
Message copied by Aura Camps on Wed Jan 27, 2013  2:52 PM ------      Message from: Ok Edwards      Created: Wed Jan 27, 2013 10:53 AM       Victorino Dike, please schedule the following for this patient with a suspicious right ovarian mass: Chest x-[place], CT of the abdomen and pelvis with and without contrast. Appointment to see GYN oncologist here in [place] within the next week to 10 days. I'm in the process later today to dictate today's office notes which you can tell them that will be in epic for them to see. ------

## 2013-01-27 NOTE — Telephone Encounter (Signed)
Appt. With Gyn oncologist on 02/10/13 with Dr.Clarke-Pearson at 8:30 am. Left message to call.

## 2013-01-28 NOTE — Telephone Encounter (Signed)
Pt informed with all the below note. 

## 2013-02-01 ENCOUNTER — Other Ambulatory Visit: Payer: Self-pay | Admitting: Gynecology

## 2013-02-01 ENCOUNTER — Ambulatory Visit (HOSPITAL_COMMUNITY)
Admission: RE | Admit: 2013-02-01 | Discharge: 2013-02-01 | Disposition: A | Discharge status: Home / Self Care | Payer: Medicare Other | Source: Ambulatory Visit | Attending: Gynecology | Admitting: Gynecology

## 2013-02-01 ENCOUNTER — Encounter (HOSPITAL_COMMUNITY): Payer: Self-pay

## 2013-02-01 DIAGNOSIS — N838 Other noninflammatory disorders of ovary, fallopian tube and broad ligament: Secondary | ICD-10-CM

## 2013-02-01 DIAGNOSIS — Z0389 Encounter for observation for other suspected diseases and conditions ruled out: Secondary | ICD-10-CM | POA: Insufficient documentation

## 2013-02-01 DIAGNOSIS — N839 Noninflammatory disorder of ovary, fallopian tube and broad ligament, unspecified: Secondary | ICD-10-CM | POA: Insufficient documentation

## 2013-02-01 LAB — OVA 1: OVA 1: 3.4

## 2013-02-01 MED ORDER — IOHEXOL 300 MG/ML  SOLN
100.0000 mL | Freq: Once | INTRAMUSCULAR | Status: AC | PRN
  Administered 2013-02-01: 100 mL via INTRAVENOUS

## 2013-02-09 ENCOUNTER — Telehealth: Payer: Self-pay | Admitting: Internal Medicine

## 2013-02-09 ENCOUNTER — Encounter: Payer: Self-pay | Admitting: Internal Medicine

## 2013-02-09 ENCOUNTER — Ambulatory Visit (INDEPENDENT_AMBULATORY_CARE_PROVIDER_SITE_OTHER): Payer: Medicare Other | Admitting: Internal Medicine

## 2013-02-09 VITALS — BP 118/80 | HR 74 | Temp 97.7°F | Resp 12 | Ht 65.0 in | Wt 200.0 lb

## 2013-02-09 DIAGNOSIS — E785 Hyperlipidemia, unspecified: Secondary | ICD-10-CM

## 2013-02-09 DIAGNOSIS — R7309 Other abnormal glucose: Secondary | ICD-10-CM

## 2013-02-09 DIAGNOSIS — E559 Vitamin D deficiency, unspecified: Secondary | ICD-10-CM

## 2013-02-09 DIAGNOSIS — E782 Mixed hyperlipidemia: Secondary | ICD-10-CM

## 2013-02-09 DIAGNOSIS — M899 Disorder of bone, unspecified: Secondary | ICD-10-CM

## 2013-02-09 DIAGNOSIS — Z23 Encounter for immunization: Secondary | ICD-10-CM

## 2013-02-09 DIAGNOSIS — M8580 Other specified disorders of bone density and structure, unspecified site: Secondary | ICD-10-CM

## 2013-02-09 DIAGNOSIS — D126 Benign neoplasm of colon, unspecified: Secondary | ICD-10-CM | POA: Insufficient documentation

## 2013-02-09 MED ORDER — PNEUMOCOCCAL VAC POLYVALENT 25 MCG/0.5ML IJ INJ: 0.5000 mL | INJECTION | Freq: Once | INTRAMUSCULAR | Status: DC

## 2013-02-09 NOTE — Progress Notes (Signed)
Subjective:    Patient ID: [name], female    DOB: July 17, 1946, 67 y.o.   MRN: [id number]  HPI  Medicare Wellness Visit:  Psychosocial & medical history were reviewed as required by Medicare (abuse,antisocial behavioral risks,firearm risk).  Social history: caffeine 1-2 cups per day:  , alcohol: 2 glasses of wine per week  ,  tobacco use: no 1st hand use, significant secondhand exposure   Exercise :   No home & personal  safety / fall risk Activities of daily living: no limitations  Seatbelt  and smoke alarm employed. Power of Attorney/Living Will status : in place Ophthalmology exam current Hearing evaluation not current Orientation :oriented X 3  Memory & recall :good Spelling  testing:good Mood & affect : normal . Depression / anxiety: denied Travel history : [place] March 2014- for one month Immunization status :tetanus given; PNA needed Transfusion history:  none  Preventive health surveillance ( colonoscopies 2013, BMD , mammograms,PAP as per protocol/ [facility]): current  Dental care:  Every 6 mos. Chart reviewed &  Updated. Active issues reviewed & addressed.      Review of Systems  She is concerned that she's had green stools on 2 isolated occasions. She has no associated unexplained weight loss or abdominal pain. She also denies melena or rectal bleeding. Colonoscopy is up-to-date as noted  She has some  R plantar fasciitis.  She's concerned about a lesion over the right external nose.  She is using a topical agent for fungal toenail changes.    Objective:   Physical Exam Gen.: Healthy and well-nourished in appearance. Alert, appropriate and cooperative throughout exam.  Head: Normocephalic without obvious abnormalities Eyes: No corneal or conjunctival inflammation noted. Pupils equal round reactive to light and accommodation.  Extraocular motion intact. Vision grossly normal with lenses Ears: External  ear exam reveals no significant lesions or  deformities. Canals clear .TMs normal. Hearing is grossly normal bilaterally. Nose: External nasal exam reveals no deformity or inflammation. Nasal mucosa are pink and moist. No lesions or exudates noted.  Mouth: Oral mucosa and oropharynx reveal no lesions or exudates. Teeth in good repair. Neck: No deformities, masses, or tenderness noted. Range of motion decreased. Thyroid normal. Lungs: Normal respiratory effort; chest expands symmetrically. Lungs are clear to auscultation without rales, wheezes, or increased work of breathing. Heart: Normal rate and rhythm. Normal S1 and S2. No gallop, click, or rub. Grade 1 systolic murmur. Abdomen: Bowel sounds normal; abdomen soft and nontender. No masses, organomegaly or hernias noted. Genitalia:As per Gyn                                  Musculoskeletal/extremities: No deformity or scoliosis noted of  the thoracic or lumbar spine.  No clubbing, cyanosis, edema, or significant extremity  deformity noted. Range of motion normal .Tone & strength  Normal. Joints normal . Some toenail discoloration from chronic fungal changes. Pes planus is present. Her pain is localized to the inferior Achilles tendon area. Able to lie down & sit up w/o help. Negative SLR bilaterally Vascular: Carotid, radial artery, dorsalis pedis and  posterior tibial pulses are full and equal. Right femoral bruit Neurologic: Alert and oriented x3. Deep tendon reflexes symmetrical and normal.        Skin: Intact without suspicious lesions or rashes. Punctate mildly erythematous lesion R nasal bridge. No ischemic changes over the feet   Lymph: No cervical, axillary lymphadenopathy present.  Psych: Mood and affect are normal. Normally interactive                                                                                        Assessment & Plan:  #1 Medicare Wellness Exam; criteria met ; data entered #2 Problem List reviewed ; Assessment/ Recommendations made  #3 Intervention  for the fungal toenail changes discussed  #4 Aveeno Daily Moisturizing lotion recommended for the nasal skin lesion. If it persists after 4 weeks; dermatology consult to be pursued  #5 She is having Achilles tendinitis. Stretching exercises and arch supports would be recommended. Plan: see Orders

## 2013-02-09 NOTE — Patient Instructions (Addendum)
Please  schedule fasting Labs : Lipids,  TSH, A1c. PLEASE BRING THESE INSTRUCTIONS TO FOLLOW UP  LAB APPOINTMENT.This will guarantee correct labs are drawn, eliminating need for repeat blood sampling ( needle sticks ! ). Diagnoses /Codes: 272.4,790.29. Wear arch supports in all shoes. Do the tennis ball  exercises for the arch ; 20 reps twice a day.  If symptoms persist; a podiatry referral is indicated. Consider glucosamine sulfate 1500 mg daily for joint symptoms. Take this daily for up to 3 months and then leave it off for 2 months. This will rehydrate the cartilages & tendons. Use  Aveeno Daily  Moisturizing Lotion  twice a day  for the  Nasal lesion.

## 2013-02-09 NOTE — Telephone Encounter (Signed)
Orders placed.

## 2013-02-09 NOTE — Telephone Encounter (Signed)
Patient would like to have labs done at [place] location. Can you place orders please?

## 2013-02-10 ENCOUNTER — Encounter: Payer: Self-pay | Admitting: Gynecology

## 2013-02-10 ENCOUNTER — Other Ambulatory Visit (INDEPENDENT_AMBULATORY_CARE_PROVIDER_SITE_OTHER): Payer: Medicare Other

## 2013-02-10 ENCOUNTER — Ambulatory Visit: Payer: Medicare Other | Attending: Gynecology | Admitting: Gynecology

## 2013-02-10 VITALS — BP 108/78 | HR 64 | Temp 98.2°F | Resp 16 | Ht 64.69 in | Wt 201.5 lb

## 2013-02-10 DIAGNOSIS — N838 Other noninflammatory disorders of ovary, fallopian tube and broad ligament: Secondary | ICD-10-CM

## 2013-02-10 DIAGNOSIS — E785 Hyperlipidemia, unspecified: Secondary | ICD-10-CM | POA: Insufficient documentation

## 2013-02-10 DIAGNOSIS — R7309 Other abnormal glucose: Secondary | ICD-10-CM

## 2013-02-10 DIAGNOSIS — N839 Noninflammatory disorder of ovary, fallopian tube and broad ligament, unspecified: Secondary | ICD-10-CM | POA: Insufficient documentation

## 2013-02-10 DIAGNOSIS — Z7982 Long term (current) use of aspirin: Secondary | ICD-10-CM | POA: Insufficient documentation

## 2013-02-10 DIAGNOSIS — Z79899 Other long term (current) drug therapy: Secondary | ICD-10-CM | POA: Insufficient documentation

## 2013-02-10 LAB — HEMOGLOBIN A1C: Hgb A1c MFr Bld: 5.7 % (ref 4.6–6.5)

## 2013-02-10 LAB — LIPID PANEL
Cholesterol: 175 mg/dL (ref 0–200)
LDL Cholesterol: 99 mg/dL (ref 0–99)
VLDL: 10 mg/dL (ref 0.0–40.0)

## 2013-02-10 LAB — TSH: TSH: 1.12 u[IU]/mL (ref 0.35–5.50)

## 2013-02-10 NOTE — Progress Notes (Signed)
Consult Note: Gyn-Onc   [name] 67 y.o. female  Chief Complaint  Patient presents with  . Ovarian mass    New Consult    Assessment : A solid right adnexal mass with a normal OVA1. CT scan shows no other evidence of metastatic disease.  Plan: Would recommend the patient undergo exploratory laparotomy, resection of the mass with intraoperative frozen section. If this is indeed benign, would recommend total abdominal hysterectomy and left salpingo-oophorectomy as well. The patient is agreement with proceeding with hysterectomy.  The patient is informed of the extent of the surgical staging should this turn out to be a malignancy. Risks of surgery reviewed and all questions are answered. Surgery is scheduled for June 17 with Dr. Cleda Mccreedy    HPI: 67 year old white female seen in consultation at the request of Dr. Lily Peer regarding management of a newly diagnosed right pelvic mass. The patient presented for routine examination whereupon a pelvic mass was identified on exam. Subsequently she's had an ultrasound and a CT scan. The right adnexal mass is considered to be solid with arterial blood flow measuring 6.9 x 5.9 x 3.9 cm. Left ovary has a solid mass measuring 1.7 x 2.4 x 2.1 cm which is avascular. Is no evidence of ascites or any other evidence of metastatic disease. OVA1 measures 3.4 (normal for menopausal patient)  The patient denies any GI or GU symptoms denies any pelvic pain pressure vaginal bleeding or discharge.  Review of Systems:10 point review of systems is negative except as noted in interval history.   Vitals: Blood pressure 108/78, pulse 64, temperature 98.2 F (36.8 C), temperature source Oral, resp. rate 16, height 5' 4.69" (1.643 m), weight 201 lb 8 oz (91.4 kg).  Physical Exam: General : The patient is a healthy woman in no acute distress.  HEENT: normocephalic, extraoccular movements normal; neck is supple without thyromegally  Lynphnodes: Supraclavicular  and inguinal nodes not enlarged  Abdomen: Soft, non-tender, no ascites, no organomegally, no masses, no hernias  Pelvic:  EGBUS: Normal female  Vagina: Normal, no lesions  Urethra and Bladder: Normal, non-tender  Cervix: Stenotic  Uterus: Difficult to outline  Bi-manual examination: Fullness in the right adnexa without discrete mass. There is no nodularity or tenderness. Rectal: normal sphincter tone, no masses, no blood  Lower extremities: No edema or varicosities. Normal range of motion      Allergies  Allergen Reactions  . Codeine Rash    Post C section    Past Medical History  Diagnosis Date  . Hyperlipidemia   . Microcalcifications of the breast     atypical ductal hyperplasia with calcifications  . Breast lobular neoplasia     atypical  . Fibrocystic breast changes   . Osteopenia     Dr Lily Peer  . PMB (postmenopausal bleeding)     Dr Lily Peer  . Atrophic vaginitis   . Seasonal allergies     Past Surgical History  Procedure Laterality Date  . Endometrial biopsy  2002    Uterine  polyps, Dr. Eda Paschal  . Colonoscopy  [id number], [id number]    polyps 2013 only, Dr. Russella Dar  . Cesarean section  1976  . Dilation and curettage of uterus    . Hysteroscopy    . Breast biopsy  01/09/2012    Procedure: BREAST BIOPSY WITH NEEDLE LOCALIZATION;  Surgeon: Velora Heckler, MD;  Location: [facility];  Service: General;  Laterality: Right;    Current Outpatient Prescriptions  Medication Sig Dispense Refill  .  aspirin 81 MG tablet Take 81 mg by mouth daily.      . Calcium-Magnesium 250-125 MG TABS Take by mouth daily.        . Cholecalciferol (VITAMIN D) 1000 UNITS capsule Take 1,000 Units by mouth daily.        . ciclopirox (PENLAC) 8 % solution Apply topically at bedtime. Apply over nail and surrounding skin. Apply daily over previous coat. After seven (7) days, may remove with alcohol and continue cycle.  6.6 mL  2  . fluticasone (FLONASE) 50 MCG/ACT nasal spray  Place 2 sprays into the nose daily.  16 g  11  . loratadine (CLARITIN) 10 MG tablet Take 10 mg by mouth daily.        . Magnesium 500 MG CAPS Take by mouth daily.        . Multiple Vitamin (MULTIVITAMIN) tablet Take 1 tablet by mouth daily.        . NONFORMULARY OR COMPOUNDED ITEM Estradiol .02% 1 ML Prefilled Applicator Sig: apply vaginally twice a week #90 Day Supply with 4 refills  1 each  4  . Omega-3 Fatty Acids (FISH OIL) 1000 MG CAPS Take by mouth daily.        Bertram Gala Glycol-Propyl Glycol (SYSTANE) 0.4-0.3 % SOLN Apply to eye. 2-3 x daily       . VALERIAN ROOT PO Take by mouth at bedtime as needed.      . Vitamin D, Ergocalciferol, (DRISDOL) 50000 UNITS CAPS Take 1 capsule (50,000 Units total) by mouth once a week.  10 capsule  4   No current facility-administered medications for this visit.    History   Social History  . Marital Status: Divorced    Spouse Name: N/A    Number of Children: N/A  . Years of Education: N/A   Occupational History  . Not on file.   Social History Main Topics  . Smoking status: Never Smoker   . Smokeless tobacco: Never Used     Comment: second hand smoke for decades  . Alcohol Use: 1.2 oz/week    2 Glasses of wine per week     Comment: socially 1 - 2 glasses of wine per week  . Drug Use: No  . Sexually Active: No   Other Topics Concern  . Not on file   Social History Narrative  . No narrative on file    Family History  Problem Relation Age of Onset  . Alcohol abuse Mother   . Cancer Father     bone marrow cancer  . Alcohol abuse Father   . Von Willebrand disease Father   . Depression Son     Recovering alcoholic  . Depression Maternal Aunt   . Prostate cancer Maternal Uncle   . Diabetes Paternal Uncle   . Heart attack Paternal Uncle 51  . Cancer Paternal Grandmother     skin cancer  . Heart failure Paternal Grandmother     in 90s  . Colon cancer Neg Hx   . Stomach cancer Neg Hx   . Stroke Neg Hx   . GER disease  Paternal Grandmother     esophageal stricture  . GER disease Son       Jeannette Corpus, MD 02/10/2013, 9:01 AM

## 2013-02-10 NOTE — Patient Instructions (Addendum)
Preoperative evaluation will be scheduled in the near future. Surgery is scheduled for June 17 with Dr. Cleda Mccreedy

## 2013-02-11 ENCOUNTER — Other Ambulatory Visit: Payer: Self-pay | Admitting: Anesthesiology

## 2013-02-11 DIAGNOSIS — Z1211 Encounter for screening for malignant neoplasm of colon: Secondary | ICD-10-CM

## 2013-02-15 ENCOUNTER — Encounter: Payer: Self-pay | Admitting: Gynecology

## 2013-02-15 ENCOUNTER — Ambulatory Visit (INDEPENDENT_AMBULATORY_CARE_PROVIDER_SITE_OTHER): Payer: Medicare Other | Admitting: Gynecology

## 2013-02-15 DIAGNOSIS — N63 Unspecified lump in unspecified breast: Secondary | ICD-10-CM

## 2013-02-15 NOTE — Progress Notes (Signed)
Patient presents with three-day history of left breast lump. No history of this previously. He is status post right breast biopsy 1 year ago for calcifications which showed atypical ductal hyperplasia. She actually called [facility] and has an appointment for mammography this morning but needed an order for me to proceed with this.  Exam with [facility] assistant Both breasts examined lying and sitting. Right without masses retractions discharge adenopathy. Well-healed prior biopsy scar. Left with 1.5-2 cm firm mass 3:00 position 1 fingerbreadth off the areola. Mobile with no overlying skin changes. No nipple discharge or adenopathy noted.  Assessment and plan: New left breast mass. Followup with diagnostic mammography and ultrasound today as patient has scheduled. We'll triage based on these results. Patient also has scheduled surgery next week with gynecologic oncology due to right adnexal mass.

## 2013-02-15 NOTE — Patient Instructions (Signed)
Follow up for mammogram/ultrasound as scheduled

## 2013-02-16 ENCOUNTER — Other Ambulatory Visit: Payer: Self-pay | Admitting: Radiology

## 2013-02-17 ENCOUNTER — Other Ambulatory Visit: Payer: Self-pay | Admitting: Radiology

## 2013-02-17 ENCOUNTER — Encounter: Payer: Self-pay | Admitting: Gynecology

## 2013-02-17 DIAGNOSIS — C50912 Malignant neoplasm of unspecified site of left female breast: Secondary | ICD-10-CM

## 2013-02-18 ENCOUNTER — Telehealth (INDEPENDENT_AMBULATORY_CARE_PROVIDER_SITE_OTHER): Payer: Self-pay

## 2013-02-18 NOTE — Telephone Encounter (Signed)
Pt called requesting sooner appt. LMOM for pt that her mri will be done on 6-11. I advised her to give a complete consult that into will be important. Pt should keep appt on 6-12 if at all possible.

## 2013-02-23 ENCOUNTER — Encounter (HOSPITAL_COMMUNITY): Payer: Self-pay | Admitting: Pharmacy Technician

## 2013-02-24 ENCOUNTER — Ambulatory Visit
Admission: RE | Admit: 2013-02-24 | Discharge: 2013-02-24 | Disposition: A | Discharge status: Home / Self Care | Payer: Medicare Other | Source: Ambulatory Visit | Attending: Radiology | Admitting: Radiology

## 2013-02-24 DIAGNOSIS — C50912 Malignant neoplasm of unspecified site of left female breast: Secondary | ICD-10-CM

## 2013-02-24 MED ORDER — GADOBENATE DIMEGLUMINE 529 MG/ML IV SOLN
19.0000 mL | Freq: Once | INTRAVENOUS | Status: AC | PRN
  Administered 2013-02-24: 19 mL via INTRAVENOUS

## 2013-02-25 ENCOUNTER — Ambulatory Visit (INDEPENDENT_AMBULATORY_CARE_PROVIDER_SITE_OTHER): Payer: BC Managed Care – PPO | Admitting: Surgery

## 2013-02-25 ENCOUNTER — Other Ambulatory Visit (INDEPENDENT_AMBULATORY_CARE_PROVIDER_SITE_OTHER): Payer: Self-pay | Admitting: Surgery

## 2013-02-25 ENCOUNTER — Encounter (INDEPENDENT_AMBULATORY_CARE_PROVIDER_SITE_OTHER): Payer: Self-pay | Admitting: Surgery

## 2013-02-25 ENCOUNTER — Telehealth (INDEPENDENT_AMBULATORY_CARE_PROVIDER_SITE_OTHER): Payer: Self-pay

## 2013-02-25 VITALS — BP 88/42 | HR 76 | Temp 98.0°F | Resp 18 | Ht 64.5 in | Wt 197.0 lb

## 2013-02-25 DIAGNOSIS — C50419 Malignant neoplasm of upper-outer quadrant of unspecified female breast: Secondary | ICD-10-CM

## 2013-02-25 DIAGNOSIS — C50412 Malignant neoplasm of upper-outer quadrant of left female breast: Secondary | ICD-10-CM

## 2013-02-25 NOTE — Patient Instructions (Signed)
Mastectomy, With or Without Reconstruction Mastectomy (removal of the breast) is a procedure most commonly used to treat cancer (tumor) of the breast. Different procedures are available for treatment. This depends on the stage of the tumor (abnormal growths). Discuss this with your caregiver, surgeon (a specialist for performing operations such as this), or oncologist (someone specialized in the treatment of cancer). With proper information, you can decide which treatment is best for you. Although the sound of the word cancer is frightening to all of us, the new treatments and medications can be a source of reassurance and comfort. If there are things you are worried about, discuss them with your caregiver. He or she can help comfort you and your family. Some of the different procedures for treating breast cancer are:  Radical (extensive) mastectomy. This is an operation used to remove the entire breast, the muscles under the breast, and all of the glands (lymph nodes) under the arm. With all of the new treatments available for cancer of the breast, this procedure has become less common.  Modified radical mastectomy. This is a similar operation to the radical mastectomy described above. In the modified radical mastectomy, the muscles of the chest wall are not removed unless one of the lessor muscles is removed. One of the lessor muscles may be removed to allow better removal of the lymph nodes. The axillary lymph nodes are also removed. Rarely, during an axillary node dissection nerves to this area are damaged. Radiation therapy is then often used to the area following this surgery.  A total mastectomy also known as a complete or simple mastectomy. It involves removal of only the breast. The lymph nodes and the muscles are left in place.  In a lumpectomy, the lump is removed from the breast. This is the simplest form of surgical treatment. A sentinel lymph node biopsy may also be done. Additional treatment  may be required. RISKS AND COMPLICATIONS The main problems that follow removal of the breast include:  Infection (germs start growing in the wound). This can usually be treated with antibiotics (medications that kill germs).  Lymphedema. This means the arm on the side of the breast that was operated on swells because the lymph (tissue fluid) cannot follow the main channels back into the body. This only occurs when the lymph nodes have had to be removed under the arm.  There may be some areas of numbness to the upper arm and around the incision (cut by the surgeon) in the breast. This happens because of the cutting of or damage to some of the nerves in the area. This is most often unavoidable.  There may be difficulty moving the arm in a full range of motion (moving in all directions) following surgery. This usually improves with time following use and exercise.  Recurrence of breast cancer may happen with the very best of surgery and follow up treatment. Sometimes small cancer cells that cannot be seen with the naked eye have already spread at the time of surgery. When this happens other treatment is available. This treatment may be radiation, medications or a combination of both. RECONSTRUCTION Reconstruction of the breast may be done immediately if there is not going to be post-operative radiation. This surgery is done for cosmetic (improve appearance) purposes to improve the physical appearance after the operation. This may be done in two ways:  It can be done using a saline filled prosthetic (an artificial breast which is filled with salt water). Silicone breast implants are now   re-approved by the FDA and are being commonly used.  Reconstruction can be done using the body's own muscle/fat/skin. Your caregiver will discuss your options with you. Depending upon your needs or choice, together you will be able to determine which procedure is best for you. Document Released: 05/28/2001 Document  Revised: 05/27/2012 Document Reviewed: 01/19/2008 Exit[employer] Patient Information 2014 [employer].  

## 2013-02-25 NOTE — Addendum Note (Signed)
Addended by: Joanette Gula on: 02/25/2013 02:12 PM   Modules accepted: Orders

## 2013-02-25 NOTE — Addendum Note (Signed)
Addended by: Joanette Gula on: 02/25/2013 03:47 PM   Modules accepted: Orders

## 2013-02-25 NOTE — Progress Notes (Signed)
General Surgery [facility]  Chief Complaint  Patient presents with  . New Evaluation    Left breast cancer - referral from Dr. Lona Kettle and Dr. Reynaldo Minium    HISTORY: Patient is a 67 year old female known to my practice from previous right breast excisional biopsy. Last year she underwent right breast excisional biopsy showing atypical ductal hyperplasia with calcifications. No malignancy was identified.  On routine self-examination approximately 10 days ago the patient noted a mass in the left breast. She presented to her gynecologist office and was referred for diagnostic studies. Mammogram was performed. Core needle biopsy was obtained on 2 lesions in the left breast, the first at the 12:00 position and the second at the 2:00 position. 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 Patient is now referred for discussion of further management.  Patient underwent bilateral breast MRI scan yesterday. Results show the disease in the left breast with likely additional disease extending towards the nipple areolar complex. There is also a area of suspicion in the right breast which may represent fat necrosis from her prior surgical procedure. Biopsy of the area in the right breast was recommended and will be scheduled.  Further complicating the situation, the patient has been diagnosed with a pelvic mass and is scheduled for surgical exploration and hysterectomy early next week.  Past Medical History  Diagnosis Date  . Hyperlipidemia   . Microcalcifications of the breast     atypical ductal hyperplasia with calcifications  . Breast lobular neoplasia     atypical  . Fibrocystic breast changes   . Osteopenia     Dr Lily Peer  . PMB (postmenopausal bleeding)     Dr Lily Peer  . Atrophic vaginitis   . Seasonal allergies      Current Outpatient Prescriptions  Medication Sig Dispense Refill  . Calcium-Magnesium 250-125 MG TABS  Take 1 tablet by mouth 2 (two) times daily.       . cholecalciferol (VITAMIN D) 1000 UNITS tablet Take 1,000 Units by mouth daily.      . fluticasone (FLONASE) 50 MCG/ACT nasal spray Place 2 sprays into the nose daily as needed for rhinitis or allergies.      Marland Kitchen glucosamine-chondroitin 500-400 MG tablet Take 1 tablet by mouth daily.       Marland Kitchen loratadine (CLARITIN) 10 MG tablet Take 10 mg by mouth daily.      . Magnesium 500 MG TABS Take 500 mg by mouth daily.      . Multiple Vitamin (MULTIVITAMIN) tablet Take 1 tablet by mouth daily.        . NONFORMULARY OR COMPOUNDED ITEM Estradiol .02% 1 ML Prefilled Applicator Sig: apply vaginally twice a week #90 Day Supply with 4 refills  1 each  4  . Omega-3 Fatty Acids (FISH OIL) 1000 MG CAPS Take by mouth daily.        Bertram Gala Glycol-Propyl Glycol (SYSTANE) 0.4-0.3 % SOLN Apply to eye. 2-3 x daily       . VALERIAN ROOT PO Take by mouth at bedtime as needed.      . Vitamin D, Ergocalciferol, (DRISDOL) 50000 UNITS CAPS Take 50,000 Units by mouth every 14 (fourteen) days.      . ciclopirox (PENLAC) 8 % solution Apply topically at bedtime. Apply over nail and surrounding skin. Apply daily over previous coat. After seven (7) days, may remove with alcohol and continue cycle.  6.6 mL  2   No current facility-administered  medications for this visit.     Allergies  Allergen Reactions  . Codeine Rash    Post C section     Family History  Problem Relation Age of Onset  . Alcohol abuse Mother   . Cancer Father     bone marrow cancer  . Alcohol abuse Father   . Von Willebrand disease Father   . Depression Son     Recovering alcoholic  . Depression Maternal Aunt   . Prostate cancer Maternal Uncle   . Diabetes Paternal Uncle   . Heart attack Paternal Uncle 51  . Cancer Paternal Grandmother     skin cancer  . Heart failure Paternal Grandmother     in 90s  . Colon cancer Neg Hx   . Stomach cancer Neg Hx   . Stroke Neg Hx   . GER disease  Paternal Grandmother     esophageal stricture  . GER disease Son      History   Social History  . Marital Status: Divorced    Spouse Name: N/A    Number of Children: N/A  . Years of Education: N/A   Social History Main Topics  . Smoking status: Never Smoker   . Smokeless tobacco: Never Used     Comment: second hand smoke for decades  . Alcohol Use: 1.2 oz/week    2 Glasses of wine per week     Comment: socially 1 - 2 glasses of wine per week  . Drug Use: No  . Sexually Active: No   Other Topics Concern  . None   Social History Narrative  . None     REVIEW OF SYSTEMS - PERTINENT POSITIVES ONLY: Patient noted left breast mass on self-examination. Denies pain. Denies nipple discharge.  EXAM: Filed Vitals:   02/25/13 1308  BP: 88/42  Pulse: 76  Temp: 98 F (36.7 C)  Resp: 18    HEENT: normocephalic; pupils equal and reactive; sclerae clear; dentition good; mucous membranes moist NECK:  symmetric on extension; no palpable anterior or posterior cervical lymphadenopathy; no supraclavicular masses; no tenderness CHEST: clear to auscultation bilaterally without rales, rhonchi, or wheezes CARDIAC: regular rate and rhythm without significant murmur; peripheral pulses are full BREAST: Right breast with well-healed surgical incision in the upper central portion of the breast; no dominant nor discrete masses palpable; right axilla without palpable adenopathy; left breast shows areas of ecchymosis in the upper outer quadrant consistent with recent core needle biopsy; there is a palpable mass at approximately the 2:00 position in the upper outer quadrant measuring approximately 3 cm in greatest dimension, mobile, and mildly tender; there is not a palpable mass at the 12:00 position; nipple areolar complex is normal; left axilla is free of adenopathy EXT:  non-tender without edema; no deformity NEURO: no gross focal deficits; no sign of tremor   LABORATORY RESULTS: See [facility] (CHL-Epic) for most recent results   RADIOLOGY RESULTS: See [facility] (CHL-Epic) for most recent results   IMPRESSION: #1 left breast mammary carcinoma, multifocal, with associated carcinoma in situ #2 MRI identified abnormality right breast, biopsy pending #3 pelvic mass, exploratory surgery and hysterectomy is scheduled for early next week  PLAN: I had a lengthy discussion with the patient and her son today regarding the above findings. I provided him with copies of the pathology report and MRI scan results.  Given the multifocal nature of her malignancy, the location in the left breast, and the possible additional foci of disease identified on  MRI scan, I have recommended a left total mastectomy with a low axillary lymph node dissection. We discussed breast conservation surgery with lumpectomy and sentinel lymph node biopsy, but I think the cosmetic results will certainly be compromised and I am uncertain that this will be successful in removing all of the malignancy from the left breast. We also discussed the possibility of breast reconstruction. At this point the patient does not drink she wishes to proceed with reconstruction, but I have asked her to be seen and evaluated by a plastic surgeon so that she may know what her options are and may be fully informed when she makes her decision.  We will arrange for the MR guided biopsy of the right breast abnormality in the immediate future.  We will arrange for plastic surgical consultation to discuss possible reconstruction. Patient will be discussed at the multidisciplinary breast conference next week.  Patient will plan to proceed with scheduled gynecologic surgery next week.  This plan was discussed at length with the patient and her son. They understand and are in agreement.  Velora Heckler, MD, FACS General & Endocrine Surgery [facility]   Visit Diagnoses: 1. Breast cancer of upper-outer  quadrant of left female breast, multifocal     Primary Care Physician: Marga Melnick, MD

## 2013-02-25 NOTE — Patient Instructions (Addendum)
[name]  02/25/2013   Your procedure is scheduled on:  03/02/13    Report to [facility] at    0745 AM.  Call this number if you have problems the morning of surgery: [phone]   Remember:   Do not eat food or drink liquids after midnight.   Take these medicines the morning of surgery with A SIP OF WATER:    Do not wear jewelry, make-up or nail polish.  Do not wear lotions, powders, or perfumes.   Do not shave 48 hours prior to surgery.   Do not bring valuables to the hospital.  Contacts, dentures or bridgework may not be worn into surgery.  Leave suitcase in the car. After surgery it may be brought to your room.  For patients admitted to the hospital, checkout time is 11:00 AM the day of  discharge.       SEE CHG INSTRUCTION SHEET    Please read over the following fact sheets that you were given: MRSA Information, coughing and deep breathing exercises, leg exercises, Incentive Spirometry Fact Sheet, Blood Transfusion Fact sheet                Failure to comply with these instructions may result in cancellation of your surgery.                Patient Signature ____________________________              Nurse Signature _____________________________

## 2013-02-25 NOTE — Telephone Encounter (Signed)
Pt notified of appt with Dr Odis Luster on 6-24 at 2:15. Pt advised I spoke with Victorino Dike at [facility]. She states she will be setting up MRI bx for pt and will call pt asap with date. I explained the pt has ovary surgery on 6-17 and wanted to try to do bx before this surgery. Victorino Dike states Friday already booked and possibly Monday can be done but will have to ck with radiologist. Victorino Dike will call pt asap with date of bx. Pt aware and awaiting call.

## 2013-02-26 ENCOUNTER — Other Ambulatory Visit: Payer: Medicare Other

## 2013-02-26 ENCOUNTER — Encounter (HOSPITAL_COMMUNITY)
Admission: RE | Admit: 2013-02-26 | Discharge: 2013-02-26 | Disposition: A | Discharge status: Home / Self Care | Payer: Medicare Other | Source: Ambulatory Visit | Attending: Gynecologic Oncology | Admitting: Gynecologic Oncology

## 2013-02-26 ENCOUNTER — Telehealth: Payer: Self-pay | Admitting: *Deleted

## 2013-02-26 ENCOUNTER — Encounter (HOSPITAL_COMMUNITY): Payer: Self-pay

## 2013-02-26 DIAGNOSIS — Z01812 Encounter for preprocedural laboratory examination: Secondary | ICD-10-CM | POA: Insufficient documentation

## 2013-02-26 DIAGNOSIS — R1900 Intra-abdominal and pelvic swelling, mass and lump, unspecified site: Secondary | ICD-10-CM | POA: Insufficient documentation

## 2013-02-26 HISTORY — DX: Unspecified osteoarthritis, unspecified site: M19.90

## 2013-02-26 HISTORY — DX: Cardiac murmur, unspecified: R01.1

## 2013-02-26 LAB — CBC WITH DIFFERENTIAL/PLATELET
Hemoglobin: 14.7 g/dL (ref 12.0–15.0)
Lymphocytes Relative: 29 % (ref 12–46)
Lymphs Abs: 1.7 10*3/uL (ref 0.7–4.0)
Monocytes Relative: 9 % (ref 3–12)
Neutro Abs: 3.3 10*3/uL (ref 1.7–7.7)
Neutrophils Relative %: 57 % (ref 43–77)
Platelets: 171 10*3/uL (ref 150–400)
RBC: 5.16 MIL/uL — ABNORMAL HIGH (ref 3.87–5.11)
WBC: 5.7 10*3/uL (ref 4.0–10.5)

## 2013-02-26 LAB — COMPREHENSIVE METABOLIC PANEL
ALT: 15 U/L (ref 0–35)
Albumin: 3.8 g/dL (ref 3.5–5.2)
Alkaline Phosphatase: 99 U/L (ref 39–117)
BUN: 11 mg/dL (ref 6–23)
Chloride: 102 mEq/L (ref 96–112)
GFR calc Af Amer: >90 mL/min (ref >90)
Glucose, Bld: 108 mg/dL — ABNORMAL HIGH (ref 70–99)
Potassium: 4.5 mEq/L (ref 3.5–5.1)
Sodium: 140 mEq/L (ref 135–145)
Total Bilirubin: 0.7 mg/dL (ref 0.3–1.2)
Total Protein: 7.9 g/dL (ref 6.0–8.3)

## 2013-02-26 LAB — SURGICAL PCR SCREEN: MRSA, PCR: NEGATIVE

## 2013-02-26 MED ORDER — ZOLPIDEM TARTRATE 5 MG PO TABS: 5.0000 mg | ORAL_TABLET | Freq: Every evening | ORAL | Status: DC | PRN

## 2013-02-26 NOTE — Telephone Encounter (Signed)
Zolpidem 5 mg qhs prn #10

## 2013-02-26 NOTE — Telephone Encounter (Signed)
Pt left VM that she was recently Dx with cancer and  is schedule to have surgery on Tuesday. Pt indicated that she is having a difficulty time with sleep and would like for [facility] to Rx 4 tabs of Ambien for her to help with sleep.Pt uses [facility] [address].Please advise

## 2013-02-26 NOTE — Telephone Encounter (Signed)
Discuss with patient, Rx sent. 

## 2013-03-01 ENCOUNTER — Telehealth: Payer: Self-pay | Admitting: Gynecologic Oncology

## 2013-03-01 ENCOUNTER — Encounter: Payer: Self-pay | Admitting: Internal Medicine

## 2013-03-01 NOTE — Telephone Encounter (Signed)
Telephone call to check on pre-operative status.  Patient complaint with pre-operative instructions.  Reinforced NPO after midnight.  No questions or concerns voiced.  Instructed to call for any needs. 

## 2013-03-02 ENCOUNTER — Inpatient Hospital Stay (HOSPITAL_COMMUNITY)
Admission: RE | Admit: 2013-03-02 | Discharge: 2013-03-05 | DRG: 743 | Disposition: A | Discharge status: Home / Self Care | Payer: Medicare Other | Source: Ambulatory Visit | Attending: Obstetrics & Gynecology | Admitting: Obstetrics & Gynecology

## 2013-03-02 ENCOUNTER — Inpatient Hospital Stay (HOSPITAL_COMMUNITY): Payer: Medicare Other | Admitting: Certified Registered Nurse Anesthetist

## 2013-03-02 ENCOUNTER — Encounter (HOSPITAL_COMMUNITY): Payer: Self-pay | Admitting: Certified Registered Nurse Anesthetist

## 2013-03-02 ENCOUNTER — Encounter (HOSPITAL_COMMUNITY)
Admission: RE | Disposition: A | Discharge status: Home / Self Care | Payer: Self-pay | Source: Ambulatory Visit | Attending: Obstetrics & Gynecology

## 2013-03-02 ENCOUNTER — Encounter (HOSPITAL_COMMUNITY): Payer: Self-pay | Admitting: *Deleted

## 2013-03-02 DIAGNOSIS — E785 Hyperlipidemia, unspecified: Secondary | ICD-10-CM | POA: Diagnosis present

## 2013-03-02 DIAGNOSIS — R1900 Intra-abdominal and pelvic swelling, mass and lump, unspecified site: Secondary | ICD-10-CM | POA: Diagnosis present

## 2013-03-02 DIAGNOSIS — Z01812 Encounter for preprocedural laboratory examination: Secondary | ICD-10-CM

## 2013-03-02 DIAGNOSIS — D279 Benign neoplasm of unspecified ovary: Principal | ICD-10-CM | POA: Diagnosis present

## 2013-03-02 HISTORY — PX: SALPINGOOPHORECTOMY: SHX82

## 2013-03-02 HISTORY — PX: ABDOMINAL HYSTERECTOMY: SHX81

## 2013-03-02 LAB — TYPE AND SCREEN: Antibody Screen: NEGATIVE

## 2013-03-02 SURGERY — HYSTERECTOMY, ABDOMINAL
Anesthesia: General | Wound class: Clean Contaminated

## 2013-03-02 MED ORDER — GLYCOPYRROLATE 0.2 MG/ML IJ SOLN
INTRAMUSCULAR | Status: DC | PRN
  Administered 2013-03-02: 0.6 mg via INTRAVENOUS
  Administered 2013-03-02: 0.2 mg via INTRAVENOUS

## 2013-03-02 MED ORDER — HYDROMORPHONE HCL PF 1 MG/ML IJ SOLN
0.2500 mg | INTRAMUSCULAR | Status: DC | PRN
  Administered 2013-03-02 (×4): 0.5 mg via INTRAVENOUS

## 2013-03-02 MED ORDER — ZOLPIDEM TARTRATE 5 MG PO TABS: 5.0000 mg | ORAL_TABLET | Freq: Every evening | ORAL | Status: DC | PRN

## 2013-03-02 MED ORDER — KETAMINE HCL 10 MG/ML IJ SOLN
INTRAMUSCULAR | Status: DC | PRN
  Administered 2013-03-02: 20 mg via INTRAVENOUS

## 2013-03-02 MED ORDER — DIPHENHYDRAMINE HCL 12.5 MG/5ML PO ELIX: 12.5000 mg | ORAL_SOLUTION | Freq: Four times a day (QID) | ORAL | Status: DC | PRN

## 2013-03-02 MED ORDER — PROMETHAZINE HCL 25 MG/ML IJ SOLN: 6.2500 mg | INTRAMUSCULAR | Status: DC | PRN

## 2013-03-02 MED ORDER — DEXAMETHASONE SODIUM PHOSPHATE 10 MG/ML IJ SOLN
INTRAMUSCULAR | Status: DC | PRN
  Administered 2013-03-02: 10 mg via INTRAVENOUS

## 2013-03-02 MED ORDER — LIDOCAINE HCL (CARDIAC) 20 MG/ML IV SOLN
INTRAVENOUS | Status: DC | PRN
  Administered 2013-03-02: 100 mg via INTRAVENOUS

## 2013-03-02 MED ORDER — HYDROMORPHONE HCL PF 1 MG/ML IJ SOLN
0.5000 mg | INTRAMUSCULAR | Status: DC | PRN
  Administered 2013-03-02: 15:00:00 via INTRAVENOUS
  Filled 2013-03-02: qty 1

## 2013-03-02 MED ORDER — ACETAMINOPHEN 10 MG/ML IV SOLN
1000.0000 mg | Freq: Four times a day (QID) | INTRAVENOUS | Status: DC
  Administered 2013-03-02: 1000 mg via INTRAVENOUS

## 2013-03-02 MED ORDER — DIPHENHYDRAMINE HCL 50 MG/ML IJ SOLN: 12.5000 mg | Freq: Four times a day (QID) | INTRAMUSCULAR | Status: DC | PRN

## 2013-03-02 MED ORDER — ONDANSETRON HCL 4 MG/2ML IJ SOLN: 4.0000 mg | Freq: Four times a day (QID) | INTRAMUSCULAR | Status: DC | PRN

## 2013-03-02 MED ORDER — SUCCINYLCHOLINE CHLORIDE 20 MG/ML IJ SOLN
INTRAMUSCULAR | Status: DC | PRN
  Administered 2013-03-02: 100 mg via INTRAVENOUS

## 2013-03-02 MED ORDER — ATROPINE SULFATE 0.4 MG/ML IJ SOLN
INTRAMUSCULAR | Status: DC | PRN
  Administered 2013-03-02: 0.4 mg via INTRAVENOUS

## 2013-03-02 MED ORDER — OXYCODONE-ACETAMINOPHEN 5-325 MG PO TABS
1.0000 | ORAL_TABLET | ORAL | Status: DC | PRN
  Administered 2013-03-03 (×2): 1 via ORAL
  Filled 2013-03-02: qty 2
  Filled 2013-03-02: qty 1

## 2013-03-02 MED ORDER — MIDAZOLAM HCL 5 MG/5ML IJ SOLN
INTRAMUSCULAR | Status: DC | PRN
  Administered 2013-03-02: 2 mg via INTRAVENOUS

## 2013-03-02 MED ORDER — BUPIVACAINE LIPOSOME 1.3 % IJ SUSP
20.0000 mL | Freq: Once | INTRAMUSCULAR | Status: DC
  Filled 2013-03-02: qty 20

## 2013-03-02 MED ORDER — ONDANSETRON HCL 4 MG/2ML IJ SOLN
INTRAMUSCULAR | Status: DC | PRN
  Administered 2013-03-02: 4 mg via INTRAVENOUS

## 2013-03-02 MED ORDER — CEFAZOLIN SODIUM-DEXTROSE 2-3 GM-% IV SOLR
2.0000 g | INTRAVENOUS | Status: AC
  Administered 2013-03-02: 2 g via INTRAVENOUS

## 2013-03-02 MED ORDER — PROPOFOL 10 MG/ML IV BOLUS
INTRAVENOUS | Status: DC | PRN
  Administered 2013-03-02: 150 mg via INTRAVENOUS

## 2013-03-02 MED ORDER — LACTATED RINGERS IV SOLN
INTRAVENOUS | Status: DC | PRN
  Administered 2013-03-02 (×2): via INTRAVENOUS

## 2013-03-02 MED ORDER — NALOXONE HCL 0.4 MG/ML IJ SOLN: 0.4000 mg | INTRAMUSCULAR | Status: DC | PRN

## 2013-03-02 MED ORDER — NEOSTIGMINE METHYLSULFATE 1 MG/ML IJ SOLN
INTRAMUSCULAR | Status: DC | PRN
  Administered 2013-03-02: 5 mg via INTRAVENOUS

## 2013-03-02 MED ORDER — BUPIVACAINE LIPOSOME 1.3 % IJ SUSP
INTRAMUSCULAR | Status: DC | PRN
  Administered 2013-03-02: 20 mL

## 2013-03-02 MED ORDER — FENTANYL CITRATE 0.05 MG/ML IJ SOLN
INTRAMUSCULAR | Status: DC | PRN
  Administered 2013-03-02 (×7): 50 ug via INTRAVENOUS

## 2013-03-02 MED ORDER — ONDANSETRON HCL 4 MG PO TABS: 4.0000 mg | ORAL_TABLET | Freq: Four times a day (QID) | ORAL | Status: DC | PRN

## 2013-03-02 MED ORDER — SODIUM CHLORIDE 0.9 % IJ SOLN: 9.0000 mL | INTRAMUSCULAR | Status: DC | PRN

## 2013-03-02 MED ORDER — KETOROLAC TROMETHAMINE 30 MG/ML IJ SOLN
15.0000 mg | Freq: Four times a day (QID) | INTRAMUSCULAR | Status: DC
  Filled 2013-03-02 (×12): qty 1

## 2013-03-02 MED ORDER — KCL IN DEXTROSE-NACL 20-5-0.45 MEQ/L-%-% IV SOLN
INTRAVENOUS | Status: DC
  Administered 2013-03-02 – 2013-03-03 (×2): via INTRAVENOUS
  Filled 2013-03-02 (×4): qty 1000

## 2013-03-02 MED ORDER — MAGNESIUM HYDROXIDE 400 MG/5ML PO SUSP
30.0000 mL | Freq: Three times a day (TID) | ORAL | Status: AC
  Administered 2013-03-02 – 2013-03-03 (×3): 30 mL via ORAL
  Filled 2013-03-02 (×3): qty 30

## 2013-03-02 MED ORDER — LACTATED RINGERS IV SOLN: INTRAVENOUS | Status: DC

## 2013-03-02 MED ORDER — HYDROMORPHONE 0.3 MG/ML IV SOLN
INTRAVENOUS | Status: DC
  Administered 2013-03-02: 17:00:00 via INTRAVENOUS
  Administered 2013-03-02: 0.799 mg via INTRAVENOUS
  Administered 2013-03-03: 0.4 mg via INTRAVENOUS
  Administered 2013-03-03: 0.599 mg via INTRAVENOUS
  Administered 2013-03-03: 0.3 mg via INTRAVENOUS
  Filled 2013-03-02: qty 25

## 2013-03-02 MED ORDER — ROCURONIUM BROMIDE 100 MG/10ML IV SOLN
INTRAVENOUS | Status: DC | PRN
  Administered 2013-03-02: 40 mg via INTRAVENOUS
  Administered 2013-03-02: 15 mg via INTRAVENOUS

## 2013-03-02 MED ORDER — KETOROLAC TROMETHAMINE 30 MG/ML IJ SOLN
15.0000 mg | Freq: Four times a day (QID) | INTRAMUSCULAR | Status: DC
  Administered 2013-03-02: 15 mg via INTRAVENOUS
  Administered 2013-03-02: 18:00:00 via INTRAVENOUS
  Administered 2013-03-03 (×2): 15 mg via INTRAVENOUS
  Filled 2013-03-02 (×15): qty 1

## 2013-03-02 MED ORDER — PHENYLEPHRINE HCL 10 MG/ML IJ SOLN
INTRAMUSCULAR | Status: DC | PRN
  Administered 2013-03-02: 80 ug via INTRAVENOUS

## 2013-03-02 SURGICAL SUPPLY — 51 items
ATTRACTOMAT 16X20 MAGNETIC DRP (DRAPES) ×3 IMPLANT
BAG URINE DRAINAGE (UROLOGICAL SUPPLIES) ×2 IMPLANT
BLADE EXTENDED COATED 6.5IN (ELECTRODE) ×3 IMPLANT
CANISTER SUCTION 2500CC (MISCELLANEOUS) ×3 IMPLANT
CATH FOLEY 2WAY SLVR  5CC 16FR (CATHETERS)
CATH FOLEY 2WAY SLVR 5CC 16FR (CATHETERS) ×2 IMPLANT
CLIP TI MEDIUM LARGE 6 (CLIP) ×12 IMPLANT
CLOTH BEACON ORANGE TIMEOUT ST (SAFETY) ×3 IMPLANT
COVER SURGICAL LIGHT HANDLE (MISCELLANEOUS) ×3 IMPLANT
DRAPE UTILITY XL STRL (DRAPES) ×1 IMPLANT
DRAPE WARM FLUID 44X44 (DRAPE) ×3 IMPLANT
ELECT REM PT RETURN 9FT ADLT (ELECTROSURGICAL) ×3
ELECTRODE REM PT RTRN 9FT ADLT (ELECTROSURGICAL) ×2 IMPLANT
GAUZE SPONGE 4X4 16PLY XRAY LF (GAUZE/BANDAGES/DRESSINGS) ×3 IMPLANT
GLOVE BIO SURGEON STRL SZ 6.5 (GLOVE) ×3 IMPLANT
GLOVE BIO SURGEON STRL SZ7.5 (GLOVE) ×4 IMPLANT
GLOVE BIOGEL M STRL SZ7.5 (GLOVE) ×10 IMPLANT
GLOVE BIOGEL PI IND STRL 7.0 (GLOVE) ×2 IMPLANT
GLOVE BIOGEL PI INDICATOR 7.0 (GLOVE) ×2
GOWN PREVENTION PLUS XLARGE (GOWN DISPOSABLE) ×3 IMPLANT
GOWN STRL NON-REIN LRG LVL3 (GOWN DISPOSABLE) ×6 IMPLANT
GOWN STRL REIN XL XLG (GOWN DISPOSABLE) ×3 IMPLANT
HOLDER FOLEY CATH W/STRAP (MISCELLANEOUS) ×3 IMPLANT
NDL HYPO 25X1 1.5 SAFETY (NEEDLE) ×2 IMPLANT
NEEDLE HYPO 25X1 1.5 SAFETY (NEEDLE) ×3 IMPLANT
NS IRRIG 1000ML POUR BTL (IV SOLUTION) ×12 IMPLANT
PACK ABDOMINAL WL (CUSTOM PROCEDURE TRAY) ×3 IMPLANT
SHEET LAVH (DRAPES) ×3 IMPLANT
SPONGE LAP 18X18 X RAY DECT (DISPOSABLE) ×5 IMPLANT
STAPLER VISISTAT 35W (STAPLE) ×3 IMPLANT
SUT ETHILON 1 LR 30 (SUTURE) IMPLANT
SUT PDS AB 0 CT1 36 (SUTURE) ×3 IMPLANT
SUT PDS AB 0 CTX 60 (SUTURE) ×6 IMPLANT
SUT PDS AB 1 CTXB1 36 (SUTURE) ×6 IMPLANT
SUT SILK 2 0 (SUTURE) ×3
SUT SILK 2 0 30  PSL (SUTURE)
SUT SILK 2 0 30 PSL (SUTURE) IMPLANT
SUT SILK 2-0 18XBRD TIE 12 (SUTURE) ×2 IMPLANT
SUT VIC AB 0 CT1 36 (SUTURE) ×9 IMPLANT
SUT VIC AB 2-0 CT2 27 (SUTURE) IMPLANT
SUT VIC AB 2-0 SH 27 (SUTURE) ×12
SUT VIC AB 2-0 SH 27X BRD (SUTURE) ×4 IMPLANT
SUT VIC AB 3-0 CTX 36 (SUTURE) IMPLANT
SUT VICRYL 0 TIES 12 18 (SUTURE) ×3 IMPLANT
SUT VICRYL 2 0 18  UND BR (SUTURE) ×1
SUT VICRYL 2 0 18 UND BR (SUTURE) ×2 IMPLANT
SYR CONTROL 10ML LL (SYRINGE) ×3 IMPLANT
TOWEL OR 17X26 10 PK STRL BLUE (TOWEL DISPOSABLE) ×3 IMPLANT
TOWEL OR NON WOVEN STRL DISP B (DISPOSABLE) ×3 IMPLANT
TRAY FOLEY CATH 14FRSI W/METER (CATHETERS) ×3 IMPLANT
WATER STERILE IRR 1500ML POUR (IV SOLUTION) ×3 IMPLANT

## 2013-03-02 NOTE — Preoperative (Signed)
Beta Blockers   Reason not to administer Beta Blockers:Not Applicable 

## 2013-03-02 NOTE — Transfer of Care (Signed)
Immediate Anesthesia Transfer of Care Note  Patient: [name]  Procedure(s) Performed: Procedure(s) (LRB): TOTAL HYSTERECTOMY ABDOMINAL (N/A) BILATERAL SALPINGO OOPHORECTOMY (Bilateral)  Patient Location: PACU  Anesthesia Type: General  Level of Consciousness: sedated, patient cooperative and responds to stimulaton  Airway & Oxygen Therapy: Patient Spontanous Breathing and Patient connected to face mask oxgen  Post-op Assessment: Report given to PACU RN and Post -op Vital signs reviewed and stable  Post vital signs: Reviewed and stable  Complications: No apparent anesthesia complications

## 2013-03-02 NOTE — Interval H&P Note (Signed)
History and Physical Interval Note:  03/02/2013 9:46 AM  [name]  has presented today for surgery, with the diagnosis of Pelvic Mass  The various methods of treatment have been discussed with the patient and family. After consideration of risks, benefits and other options for treatment, the patient has consented to  Procedure(s): TOTAL HYSTERECTOMY ABDOMINAL (N/A) BILATERAL SALPINGO OOPHORECTOMY POSSIBLE STAGING (Bilateral) as a surgical intervention .  The patient's history has been reviewed, patient examined, no change in status, stable for surgery.  I have reviewed the patient's chart and labs.  Questions were answered to the patient's satisfaction.     [name] A.

## 2013-03-02 NOTE — Anesthesia Preprocedure Evaluation (Signed)
Anesthesia Evaluation  Patient identified by MRN, date of birth, ID band Patient awake    Reviewed: Allergy & Precautions, H&P , NPO status , Patient's Chart, lab work & pertinent test results  Airway Mallampati: III TM Distance: >3 FB Neck ROM: Full    Dental no notable dental hx. (+) Teeth Intact and Dental Advisory Given   Pulmonary neg pulmonary ROS,  breath sounds clear to auscultation  Pulmonary exam normal       Cardiovascular negative cardio ROS  Rhythm:Regular Rate:Normal     Neuro/Psych  Neuromuscular disease negative psych ROS   GI/Hepatic negative GI ROS, Neg liver ROS,   Endo/Other  negative endocrine ROS  Renal/GU negative Renal ROS  negative genitourinary   Musculoskeletal   Abdominal   Peds  Hematology negative hematology ROS (+)   Anesthesia Other Findings   Reproductive/Obstetrics negative OB ROS                           Anesthesia Physical Anesthesia Plan  ASA: II  Anesthesia Plan: General   Post-op Pain Management:    Induction: Intravenous  Airway Management Planned: Oral ETT  Additional Equipment:   Intra-op Plan:   Post-operative Plan: Extubation in OR  Informed Consent: I have reviewed the patients History and Physical, chart, labs and discussed the procedure including the risks, benefits and alternatives for the proposed anesthesia with the patient or authorized representative who has indicated his/her understanding and acceptance.   Dental advisory given  Plan Discussed with: CRNA  Anesthesia Plan Comments:         Anesthesia Quick Evaluation

## 2013-03-02 NOTE — Progress Notes (Signed)
Utilization review completed.  

## 2013-03-02 NOTE — Op Note (Signed)
PATIENT: [name] DATE OF BIRTH: Dec 12, 1945 ENCOUNTER DATE: 03/02/2013   Preop Diagnosis: Solid right adnexal mass, normal OVA-1  Postoperative Diagnosis: Ovarian Fibroma.   Surgery: Total abdominal hysterectomy, bilateral salpingo-oophorectomy  Surgeons:  Rejeana Brock A. Duard Brady, MD; Antionette Char, MD   Anesthesia: General   Anesthesiologist: Fortune  Estimated blood loss:100 ml   IVF: 1500 ml   Urine output: 125 ml   Complications: None   Pathology: Uterus, cervix, bilateral tubes and ovaries  Operative findings: 7 cm solid right ovarian mass most consistent visually with a fibroma. Adhesive disease of the rectosigmoid colon to the left abdominal wall. Adhesive disease of the bladder to the lower uterine segment. Frozen section was consistent with a stromal tumor was likely a fibroma.  Procedure: The patient was identified in the preoperative holding area. Informed consent was signed on the chart. Patient was seen history was reviewed and exam was performed.   The patient was then taken to the operating room and placed in the supine position with SCD hose on. General anesthesia was then induced without difficulty. She was then placed in the dorsolithotomy position. The abdomen was prepped with chlor prep sponges per protocol. Perineum was prepped with Betadine. The vagina was prepped with Betadine a Foley catheter was inserted into the bladder under sterile conditions.  The patient was then draped after the prep was dried. Timeout was performed the patient, procedure, antibiotic, allergy, and length of procedure. A vertical midline infraumbilical incision was and I think carried down to the underlying fashion using Bovie cautery. The fascia was scored in the fascial incision was extended superiorly and inferiorly using Bovie cautery. The rectus bellies were dissected off the overlying fashion. The peritoneum was tented and entered. The peritoneal incision was extended superiorly  and inferiorly with visualization of the underlying peritoneal cavity. The adhesive disease of the rectosigmoid colon to the left abdominal peritoneum was taken down using sharp dissection. The Buchwalter self-retaining retractor was then placed. At the initial placement as well as at several points during the case the lateral blades were checked to ensure no significant pressure on the psoas bellies.  The small and large bowel were packed out of the way of the surgical field with moist laparotomy sponges and malleable retractors were attached to the Buchwalter. Abdominal pelvic washings were obtained. The round ligament on the patient's right side was transected with monopolar cautery the anterior posterior leaves the broad ligament were opened. A window was made between the infundibulopelvic vessels and the ureter. The vessels were clamped x2 transected and suture ligated. The utero-ovarian vessels were then clamped. The ovary was amputated from the uterus and sent for frozen section. The bladder flap was created at this point in the uterine vessels on the right side were skeletonized. The uterine vessels were clamped with curved Masterson clamps transected and suture ligated. Our attention was turned to the left side were similar procedure was performed in that the round ligament was transected and the anterior and posterior leaves the broad ligament were opened. A window was made between the vessels and the ureter on the left and the vessels were clamped x2 transected and suture ligated. The uterine vessels were skeletonized clamped x2 transected and suture ligated. For continued down the cardinal ligaments with straight Masterson clamps we reach the cervicovaginal junction. We came across the vagina just under the cervix with curved Masterson clamps. The cervix was amputated from the vagina. The angle pedicles as well as vaginal cuff were closed using  figure-of-eight sutures of 0 Vicryl.  At this point  frozen section returned as ovarian fibroma. The pedicles were noted to be hemostatic. The abdomen pelvis were copiously irrigated. The retractor and laparotomy sponges were removed. The fascia was closed using running mass closure of #1 PDS. The subcutaneous tissues were irrigated and made hemostatic. 20 mL of Exparel within 20 mL of normal saline was injected for postoperative pain control. The skin was closed using staples.  All instrument, suture, laparotomy, Griffitts-Tec, and needle counts were correct x2. The patient tolerated the procedure well and was taken recovery room in stable condition. This is [name] dictating an operative note on [employer].

## 2013-03-02 NOTE — H&P (View-Only) (Signed)
Consult Note: Gyn-Onc   [name] 67 y.o. female  Chief Complaint  Patient presents with  . Ovarian mass    New Consult    Assessment : A solid right adnexal mass with a normal OVA1. CT scan shows no other evidence of metastatic disease.  Plan: Would recommend the patient undergo exploratory laparotomy, resection of the mass with intraoperative frozen section. If this is indeed benign, would recommend total abdominal hysterectomy and left salpingo-oophorectomy as well. The patient is agreement with proceeding with hysterectomy.  The patient is informed of the extent of the surgical staging should this turn out to be a malignancy. Risks of surgery reviewed and all questions are answered. Surgery is scheduled for June 17 with Dr. Paola Gehrig    HPI: 67-year-old white female seen in consultation at the request of Dr. Fernandez regarding management of a newly diagnosed right pelvic mass. The patient presented for routine examination whereupon a pelvic mass was identified on exam. Subsequently she's had an ultrasound and a CT scan. The right adnexal mass is considered to be solid with arterial blood flow measuring 6.9 x 5.9 x 3.9 cm. Left ovary has a solid mass measuring 1.7 x 2.4 x 2.1 cm which is avascular. Is no evidence of ascites or any other evidence of metastatic disease. OVA1 measures 3.4 (normal for menopausal patient)  The patient denies any GI or GU symptoms denies any pelvic pain pressure vaginal bleeding or discharge.  Review of Systems:10 point review of systems is negative except as noted in interval history.   Vitals: Blood pressure 108/78, pulse 64, temperature 98.2 F (36.8 C), temperature source Oral, resp. rate 16, height 5' 4.69" (1.643 m), weight 201 lb 8 oz (91.4 kg).  Physical Exam: General : The patient is a healthy woman in no acute distress.  HEENT: normocephalic, extraoccular movements normal; neck is supple without thyromegally  Lynphnodes: Supraclavicular  and inguinal nodes not enlarged  Abdomen: Soft, non-tender, no ascites, no organomegally, no masses, no hernias  Pelvic:  EGBUS: Normal female  Vagina: Normal, no lesions  Urethra and Bladder: Normal, non-tender  Cervix: Stenotic  Uterus: Difficult to outline  Bi-manual examination: Fullness in the right adnexa without discrete mass. There is no nodularity or tenderness. Rectal: normal sphincter tone, no masses, no blood  Lower extremities: No edema or varicosities. Normal range of motion      Allergies  Allergen Reactions  . Codeine Rash    Post C section    Past Medical History  Diagnosis Date  . Hyperlipidemia   . Microcalcifications of the breast     atypical ductal hyperplasia with calcifications  . Breast lobular neoplasia     atypical  . Fibrocystic breast changes   . Osteopenia     Dr Fernandez  . PMB (postmenopausal bleeding)     Dr Fernandez  . Atrophic vaginitis   . Seasonal allergies     Past Surgical History  Procedure Laterality Date  . Endometrial biopsy  2002    Uterine  polyps, Dr. Gottsegen  . Colonoscopy  1986, 2003,2013    polyps 2013 only, Dr. Stark  . Cesarean section  1976  . Dilation and curettage of uterus    . Hysteroscopy    . Breast biopsy  01/09/2012    Procedure: BREAST BIOPSY WITH NEEDLE LOCALIZATION;  Surgeon: Todd M Gerkin, MD;  Location: [facility];  Service: General;  Laterality: Right;    Current Outpatient Prescriptions  Medication Sig Dispense Refill  .   aspirin 81 MG tablet Take 81 mg by mouth daily.      . Calcium-Magnesium 250-125 MG TABS Take by mouth daily.        . Cholecalciferol (VITAMIN D) 1000 UNITS capsule Take 1,000 Units by mouth daily.        . ciclopirox (PENLAC) 8 % solution Apply topically at bedtime. Apply over nail and surrounding skin. Apply daily over previous coat. After seven (7) days, may remove with alcohol and continue cycle.  6.6 mL  2  . fluticasone (FLONASE) 50 MCG/ACT nasal spray  Place 2 sprays into the nose daily.  16 g  11  . loratadine (CLARITIN) 10 MG tablet Take 10 mg by mouth daily.        . Magnesium 500 MG CAPS Take by mouth daily.        . Multiple Vitamin (MULTIVITAMIN) tablet Take 1 tablet by mouth daily.        . NONFORMULARY OR COMPOUNDED ITEM Estradiol .02% 1 ML Prefilled Applicator Sig: apply vaginally twice a week #90 Day Supply with 4 refills  1 each  4  . Omega-3 Fatty Acids (FISH OIL) 1000 MG CAPS Take by mouth daily.        . Polyethyl Glycol-Propyl Glycol (SYSTANE) 0.4-0.3 % SOLN Apply to eye. 2-3 x daily       . VALERIAN ROOT PO Take by mouth at bedtime as needed.      . Vitamin D, Ergocalciferol, (DRISDOL) 50000 UNITS CAPS Take 1 capsule (50,000 Units total) by mouth once a week.  10 capsule  4   No current facility-administered medications for this visit.    History   Social History  . Marital Status: Divorced    Spouse Name: N/A    Number of Children: N/A  . Years of Education: N/A   Occupational History  . Not on file.   Social History Main Topics  . Smoking status: Never Smoker   . Smokeless tobacco: Never Used     Comment: second hand smoke for decades  . Alcohol Use: 1.2 oz/week    2 Glasses of wine per week     Comment: socially 1 - 2 glasses of wine per week  . Drug Use: No  . Sexually Active: No   Other Topics Concern  . Not on file   Social History Narrative  . No narrative on file    Family History  Problem Relation Age of Onset  . Alcohol abuse Mother   . Cancer Father     bone marrow cancer  . Alcohol abuse Father   . Von Willebrand disease Father   . Depression Son     Recovering alcoholic  . Depression Maternal Aunt   . Prostate cancer Maternal Uncle   . Diabetes Paternal Uncle   . Heart attack Paternal Uncle 51  . Cancer Paternal Grandmother     skin cancer  . Heart failure Paternal Grandmother     in 90s  . Colon cancer Neg Hx   . Stomach cancer Neg Hx   . Stroke Neg Hx   . GER disease  Paternal Grandmother     esophageal stricture  . GER disease Son       CLARKE-PEARSON,Chett Taniguchi L, MD 02/10/2013, 9:01 AM   

## 2013-03-02 NOTE — Anesthesia Postprocedure Evaluation (Signed)
Anesthesia Post Note  Patient: [name]  Procedure(s) Performed: Procedure(s) (LRB): TOTAL HYSTERECTOMY ABDOMINAL (N/A) BILATERAL SALPINGO OOPHORECTOMY (Bilateral)  Anesthesia type: General  Patient location: PACU  Post pain: Pain level controlled  Post assessment: Post-op Vital signs reviewed  Last Vitals:  Filed Vitals:   03/02/13 1300  BP: 113/48  Pulse: 55  Temp: 36.3 C  Resp: 14    Post vital signs: Reviewed  Level of consciousness: sedated  Complications: No apparent anesthesia complications

## 2013-03-02 NOTE — Plan of Care (Signed)
Problem: Phase I Progression Outcomes Goal: Pain controlled with appropriate interventions Outcome: Progressing progressing     

## 2013-03-03 ENCOUNTER — Encounter (HOSPITAL_COMMUNITY): Payer: Self-pay | Admitting: Gynecologic Oncology

## 2013-03-03 LAB — CBC
Hemoglobin: 12.1 g/dL (ref 12.0–15.0)
MCH: 28.2 pg (ref 26.0–34.0)
RBC: 4.29 MIL/uL (ref 3.87–5.11)

## 2013-03-03 LAB — BASIC METABOLIC PANEL
GFR calc non Af Amer: 88 mL/min — ABNORMAL LOW (ref >90)
Glucose, Bld: 177 mg/dL — ABNORMAL HIGH (ref 70–99)
Potassium: 4.2 mEq/L (ref 3.5–5.1)
Sodium: 135 mEq/L (ref 135–145)

## 2013-03-03 MED ORDER — DIPHENHYDRAMINE HCL 25 MG PO CAPS
25.0000 mg | ORAL_CAPSULE | Freq: Four times a day (QID) | ORAL | Status: DC | PRN
  Administered 2013-03-03 – 2013-03-04 (×2): 25 mg via ORAL
  Filled 2013-03-03 (×2): qty 1

## 2013-03-03 MED ORDER — TRAMADOL HCL 50 MG PO TABS
50.0000 mg | ORAL_TABLET | Freq: Four times a day (QID) | ORAL | Status: DC | PRN
  Administered 2013-03-03 – 2013-03-05 (×6): 50 mg via ORAL
  Filled 2013-03-03 (×6): qty 1

## 2013-03-03 NOTE — Care Management Note (Addendum)
    Page 1 of 1   03/03/2013     8:34:21 AM   CARE MANAGEMENT NOTE 03/03/2013  Patient:  [name], [name]   Account Number:  [id number]  Date Initiated:  03/03/2013  Documentation initiated by:  Lorenda Ishihara  Subjective/Objective Assessment:   68 yo female admitted s/p open hysterectomy. PTA lived at home spouse.     Action/Plan:   Home when stable   Anticipated DC Date:  03/06/2013   Anticipated DC Plan:  HOME/SELF CARE      DC Planning Services  CM consult      Choice offered to / List presented to:             Status of service:  Completed, signed off Medicare Important Message given?   (If response is "NO", the following Medicare IM given date fields will be blank) Date Medicare IM given:   Date Additional Medicare IM given:    Discharge Disposition:  HOME/SELF CARE  Per UR Regulation:  Reviewed for med. necessity/level of care/duration of stay  If discussed at Long Length of Stay Meetings, dates discussed:    Comments:

## 2013-03-03 NOTE — Progress Notes (Signed)
1 Day Post-Op Procedure(s) (LRB): TOTAL HYSTERECTOMY ABDOMINAL (N/A) BILATERAL SALPINGO OOPHORECTOMY (Bilateral)  Subjective: Patient reports tolerating diet with no nausea or emesis.  Adequate pain relief reported with Percocet use as needed.  Ambulating and voiding without difficulty.  She denies passing flatus or having a bowel movement.  Denies chest pain, dyspnea, or lightheadedness.  Trace amount of vaginal spotting reported.  Objective: Vital signs in last 24 hours: Temp:  [97.5 F (36.4 C)-98.6 F (37 C)] 97.9 F (36.6 C) (06/18 1000) Pulse Rate:  [51-82] 59 (06/18 1000) Resp:  [11-18] 16 (06/18 1000) BP: (98-131)/(54-84) 103/61 mmHg (06/18 1000) SpO2:  [95 %-100 %] 100 % (06/18 1000) Weight:  [197 lb 12.8 oz (89.721 kg)] 197 lb 12.8 oz (89.721 kg) (06/17 1641) Last BM Date: 03/02/13  Intake/Output from previous day: 06/17 0701 - 06/18 0700 In: 5549.6 [P.O.:1560; I.V.:3889.6; IV Piggyback:100] Out: 3075 [Urine:2975; Blood:100]  Physical Examination: General: alert, cooperative and no distress Resp: clear to auscultation bilaterally Cardio: regular rate and rhythm, S1, S2 normal, no click, no rub and mild systolic murmur noted GI: soft, non-tender; bowel sounds normal; no masses,  no organomegaly and incision: midline incision with staples clean, dry, and intact Extremities: extremities normal, atraumatic, no cyanosis or edema  Labs: WBC/Hgb/Hct/Plts:  10.8/12.1/36.6/139 (06/18 0425) BUN/Cr/glu/ALT/AST/amyl/lip:  9/0.71/--/--/--/--/-- (06/18 0425)  Assessment: 67 y.o. s/p Procedure(s): TOTAL HYSTERECTOMY ABDOMINAL BILATERAL SALPINGO OOPHORECTOMY: stable Pain:  Pain is well-controlled on oral medications.  Heme:  Stable post-operatively: Hgb 12.1 and Hct 36.6 this am  CV:  HR and BP stable post-operatively.  GI:  Tolerating po: Yes.     FEN:  Stable post-operatively.  Prophylaxis: intermittent pneumatic compression boots.  Plan: Saline lock IV Encourage  ambulation, IS use, deep breathing, and coughing Continue post operative plan of care   LOS: 1 day    [name] DEAL 03/03/2013, 1:55 PM

## 2013-03-04 ENCOUNTER — Telehealth (INDEPENDENT_AMBULATORY_CARE_PROVIDER_SITE_OTHER): Payer: Self-pay

## 2013-03-04 MED ORDER — BISACODYL 10 MG RE SUPP
10.0000 mg | Freq: Every day | RECTAL | Status: DC | PRN
  Administered 2013-03-04: 10 mg via RECTAL
  Filled 2013-03-04 (×2): qty 1

## 2013-03-04 NOTE — Progress Notes (Signed)
2 Days Post-Op Procedure(s) (LRB): TOTAL HYSTERECTOMY ABDOMINAL (N/A) BILATERAL SALPINGO OOPHORECTOMY (Bilateral)  Subjective: Patient reports tolerating diet with no nausea or emesis.  Episode of itching after Percocet use yesterday afternoon.  Adequate pain relief reported with Ultram use as needed.  Ambulating and voiding without difficulty.  She denies passing flatus or having a bowel movement.  Denies chest pain, dyspnea, or lightheadedness.  Trace amount of vaginal spotting still reported.  Objective: Vital signs in last 24 hours: Temp:  [97.7 F (36.5 C)-98.3 F (36.8 C)] 97.8 F (36.6 C) (06/19 0649) Pulse Rate:  [58-61] 61 (06/19 0649) Resp:  [16-18] 18 (06/19 0649) BP: (98-117)/(55-76) 109/71 mmHg (06/19 0649) SpO2:  [97 %-100 %] 98 % (06/19 0649) Last BM Date: 03/02/13  Intake/Output from previous day: 06/18 0701 - 06/19 0700 In: 840 [P.O.:840] Out: 1200 [Urine:1200]  Physical Examination: General: alert, cooperative and no distress Resp: clear to auscultation bilaterally Cardio: regular rate and rhythm, S1, S2 normal, no click, no rub and mild systolic murmur noted GI: soft, non-tender; bowel sounds normal; no masses,  no organomegaly and incision: midline incision with staples clean, dry, and intact Extremities: extremities normal, atraumatic, no cyanosis or edema  Assessment: 67 y.o. s/p Procedure(s): TOTAL HYSTERECTOMY ABDOMINAL BILATERAL SALPINGO OOPHORECTOMY: stable Pain:  Pain is well-controlled on oral medications.  Heme:  Stable post-operatively: Hgb 12.1 and Hct 36.6 03/03/13 am  CV:  HR and BP stable post-operatively.  GI:  Tolerating po: Yes.     FEN:  Stable post-operatively.  Prophylaxis: intermittent pneumatic compression boots.  Plan: Dulcolax suppository if no BM by this afternoon Encourage ambulation, IS use, deep breathing, and coughing Continue post operative plan of care   LOS: 2 days    Anzal Bartnick DEAL 03/04/2013, 9:10  AM

## 2013-03-04 NOTE — Telephone Encounter (Signed)
Pt left msg with front desk re: review with Dr Gerrit Friends. I tried to return call to clarify what exactly the pt is requesting. LMOM.

## 2013-03-05 MED ORDER — TRAMADOL HCL 50 MG PO TABS: 50.0000 mg | ORAL_TABLET | Freq: Four times a day (QID) | ORAL | Status: DC | PRN

## 2013-03-05 NOTE — Discharge Summary (Signed)
Physician Discharge Summary  Patient ID: [name] MRN: [id number] DOB/AGE: [birth year] 67 y.o.  Admit date: 03/02/2013 Discharge date: 03/05/2013  Admission Diagnoses: Pelvic mass in female  Discharge Diagnoses:  Principal Problem:   Pelvic mass in female  Discharged Condition:  The patient is in good condition and stable for discharge.   Hospital Course: On 03/02/2013, the patient underwent the following: Procedure(s): TOTAL HYSTERECTOMY ABDOMINAL BILATERAL SALPINGO OOPHORECTOMY.  The postoperative course was uneventful.  She was discharged to home on postoperative day 3 tolerating a regular diet.  Consults: None  Significant Diagnostic Studies: None  Treatments: surgery: see above  Discharge Exam: Blood pressure 114/61, pulse 70, temperature 98.2 F (36.8 C), temperature source Oral, resp. rate 18, height 5\' 5"  (1.651 m), weight 197 lb 12.8 oz (89.721 kg), SpO2 96.00%. General appearance: alert, cooperative and no distress Resp: clear to auscultation bilaterally Cardio: regular rate and rhythm, S1, S2 normal, no click, no rub and mild systolic murmur noted GI: soft, non-tender; bowel sounds normal; no masses,  no organomegaly Extremities: extremities normal, atraumatic, no cyanosis or edema Incision/Wound: Midline incision with staples clean, dry, and intact  Disposition: 01-Home or Self Care  Discharge Orders   Future Appointments Provider Department Dept Phone   03/09/2013 12:30 PM Doylene Bode, NP [facility] [phone]   03/10/2013 7:15 AM [facility] [address] [facility] AT [address] [phone]   Patient to arrive 15 minutes prior to appointment time.   03/10/2013 9:30 AM [facility] [phone]   Patient should wear two piece clothing and wear no powder or deodorant. Patient should arrive 15 minutes early.   04/14/2013 2:45 PM Paola A. Duard Brady, MD [facility] [phone]   Future Orders Complete By Expires     Call MD for:  difficulty breathing, headache or visual disturbances  As directed     Call MD for:  extreme fatigue  As directed     Call MD for:  hives  As directed     Call MD for:  persistant dizziness or light-headedness  As directed     Call MD for:  persistant nausea and vomiting  As directed     Call MD for:  redness, tenderness, or signs of infection (pain, swelling, redness, odor or green/yellow discharge around incision site)  As directed     Call MD for:  severe uncontrolled pain  As directed     Call MD for:  temperature >100.4  As directed     Diet - low sodium heart healthy  As directed     Driving Restrictions  As directed     Comments:      No driving for 2 weeks.  Do not take narcotics and drive.    Increase activity slowly  As directed     Lifting restrictions  As directed     Comments:      No lifting greater than 10 lbs.    Sexual Activity Restrictions  As directed     Comments:      No sexual activity, nothing in the vagina, for 6 weeks.        Medication List    TAKE these medications       Calcium 250 MG Caps  Take 1 capsule by mouth daily.     cholecalciferol 1000 UNITS tablet  Commonly known as:  VITAMIN D  Take 1,000 Units  by mouth daily.     ciclopirox 8 % solution  Commonly known as:  PENLAC  Apply topically at bedtime. Apply over nail and surrounding skin. Apply daily over previous coat. After seven (7) days, may remove with alcohol and continue cycle.     Fish Oil 1000 MG Caps  Take by mouth daily.     fluticasone 50 MCG/ACT nasal spray  Commonly known as:  FLONASE  Place 2 sprays into the nose daily as needed for rhinitis or allergies.     GLUCOSAMINE SULFATE PO  Take 1 tablet by mouth daily.     loratadine 10 MG tablet  Commonly known as:  CLARITIN  Take 10 mg by mouth daily.     Magnesium 500 MG Tabs  Take 500 mg by mouth daily.     multivitamin  tablet  Take 1 tablet by mouth daily.     NONFORMULARY OR COMPOUNDED ITEM  Estradiol .02%  1 ML Prefilled Applicator  Sig: apply vaginally twice a week  #90 Day Supply with 4 refills     SYSTANE 0.4-0.3 % Soln  Generic drug:  Polyethyl Glycol-Propyl Glycol  Apply to eye. 2-3 x daily     traMADol 50 MG tablet  Commonly known as:  ULTRAM  Take 1 tablet (50 mg total) by mouth every 6 (six) hours as needed for pain.     VALERIAN ROOT PO  Take by mouth at bedtime as needed.     Vitamin D (Ergocalciferol) 50000 UNITS Caps  Commonly known as:  DRISDOL  Take 50,000 Units by mouth every 14 (fourteen) days.     zolpidem 5 MG tablet  Commonly known as:  AMBIEN  Take 1 tablet (5 mg total) by mouth at bedtime as needed for sleep.           Follow-up Information   Follow up with [facility] A., MD On 04/14/2013. (at 2:45.)    Contact information:   [address] [place] [address] [phone]       Follow up with Georjean Toya, Georgiann Mohs, NP On 03/09/2013. (at 12:30 for staple removal)    Contact information:   [address] [place] [address] [phone]       Greater than thirty minutes were spend for face to face discharge instructions and discharge orders/summary in EPIC.   Signed: Gregory Dowe DEAL 03/05/2013, 9:31 AM

## 2013-03-08 ENCOUNTER — Telehealth: Payer: Self-pay | Admitting: Gynecologic Oncology

## 2013-03-08 NOTE — Telephone Encounter (Signed)
Post op telephone call to check patient status.  Patient describes expected post operative status.  Adequate PO intake reported.  Bowels and bladder functioning without difficulty.  Pain minimal.  Reportable signs and symptoms reviewed.  Follow up appt arranged for tomorrow for staple removal.   

## 2013-03-09 ENCOUNTER — Encounter: Payer: Self-pay | Admitting: Gynecologic Oncology

## 2013-03-09 ENCOUNTER — Ambulatory Visit: Payer: Medicare Other | Attending: Gynecology | Admitting: Gynecologic Oncology

## 2013-03-09 ENCOUNTER — Telehealth (INDEPENDENT_AMBULATORY_CARE_PROVIDER_SITE_OTHER): Payer: Self-pay | Admitting: Surgery

## 2013-03-09 VITALS — BP 118/68 | HR 80 | Temp 98.5°F | Resp 18 | Ht 64.69 in | Wt 194.3 lb

## 2013-03-09 DIAGNOSIS — R1900 Intra-abdominal and pelvic swelling, mass and lump, unspecified site: Secondary | ICD-10-CM

## 2013-03-09 NOTE — Progress Notes (Signed)
Follow Up Note: Gyn-Onc  [name] 67 y.o. female  CC:  Chief Complaint  Patient presents with  . Routine Post Op    Follow up    HPI:  [name] is a 11 year old who was referred by Dr. Lily Peer for a solid right adnexal mass.  She had a normal OVA 1 and her CT scan showed no evidence of metastatic disease.  On March 02, 2013, she underwent a total abdominal hysterectomy, bilateral salpingo-oophorectomy by Dr. Cleda Mccreedy.  Her post-operative course was uneventful.  Final pathology revealed 1. 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 - BENIGN FALLOPIAN TUBAL TISSUE, NO EVIDENCE OF ENDOMETRIOSIS, ATYPIA OR MALIGNANCY. 2. Uterus and cervix, with left tube and ovary - LEIOMYOMATA AND ADENOMYOSIS. - ENDOMETRIUM: BENIGN ENDOMETRIAL POLYP AND ADJACENT BENIGN PROLIFERATIVE ENDOMETRIUM, NO ATYPIA, HYPERPLASIA OR MALIGNANCY. - CERVIX: BENIGN SQUAMOUS MUCOSA AND ENDOCERVICAL MUCOSA, NO DYSPLASIA OR MALIGNANCY. - LEFT OVARY : OVARIAN FIBROMA, 3.2 CM, NO ATYPIA OR MALIGNANCY. - FALLOPIAN TUBE: BENIGN FALLOPIAN TUBAL TISSUE, NO EVIDENCE OF ENDOMETRIOSIS, ATYPIA OR MALIGNANCY.  Interval History:  She presents today for staple removal and post-operative follow up.  She describes expected post operative status.  Adequate PO intake reported.  Bowels and bladder functioning without difficulty.  Pain minimal.  No vaginal bleeding or drainage.  No erythema or drainage from the incision reported.  No concerns voiced.    Review of Systems  Constitutional: Feels well.  Cardiovascular: No chest pain, shortness of breath, or edema.  Pulmonary: No cough or wheeze.  Gastrointestinal: No nausea, vomiting, or diarrhea. No bright red blood per rectum or change in bowel movement.  Genitourinary: No frequency, urgency, or dysuria. No vaginal bleeding or discharge.  Musculoskeletal: No  myalgia or joint pain. Neurologic: No weakness, numbness, or change in gait.  Psychology: No depression or anxiety.  Intermittent insomnia.    Current Meds:  Outpatient Encounter Prescriptions as of 03/09/2013  Medication Sig Dispense Refill  . Calcium 250 MG CAPS Take 1 capsule by mouth daily.      . cholecalciferol (VITAMIN D) 1000 UNITS tablet Take 1,000 Units by mouth daily.      . fluticasone (FLONASE) 50 MCG/ACT nasal spray Place 2 sprays into the nose daily as needed for rhinitis or allergies.      Marland Kitchen GLUCOSAMINE SULFATE PO Take 1 tablet by mouth daily.      Marland Kitchen loratadine (CLARITIN) 10 MG tablet Take 10 mg by mouth daily.      . Magnesium 500 MG TABS Take 500 mg by mouth daily.      . Multiple Vitamin (MULTIVITAMIN) tablet Take 1 tablet by mouth daily.        . NONFORMULARY OR COMPOUNDED ITEM Estradiol .02% 1 ML Prefilled Applicator Sig: apply vaginally twice a week #90 Day Supply with 4 refills  1 each  4  . Omega-3 Fatty Acids (FISH OIL) 1000 MG CAPS Take by mouth daily.        Bertram Gala Glycol-Propyl Glycol (SYSTANE) 0.4-0.3 % SOLN Apply to eye. 2-3 x daily       . VALERIAN ROOT PO Take by mouth at bedtime as needed.      . Vitamin D, Ergocalciferol, (DRISDOL) 50000 UNITS CAPS Take 50,000 Units by mouth every 14 (fourteen) days.      Marland Kitchen zolpidem (AMBIEN) 5 MG tablet Take 1 tablet (5 mg total) by mouth at bedtime as needed for sleep.  10  tablet  0  . ciclopirox (PENLAC) 8 % solution Apply topically at bedtime. Apply over nail and surrounding skin. Apply daily over previous coat. After seven (7) days, may remove with alcohol and continue cycle.  6.6 mL  2  . traMADol (ULTRAM) 50 MG tablet Take 1 tablet (50 mg total) by mouth every 6 (six) hours as needed for pain.  10 tablet  0   No facility-administered encounter medications on file as of 03/09/2013.    Allergy:  Allergies  Allergen Reactions  . Codeine Rash    Post C section    Social Hx:   History   Social History  .  Marital Status: Divorced    Spouse Name: N/A    Number of Children: N/A  . Years of Education: N/A   Occupational History  . Not on file.   Social History Main Topics  . Smoking status: Never Smoker   . Smokeless tobacco: Never Used     Comment: second hand smoke for decades  . Alcohol Use: 1.2 oz/week    2 Glasses of wine per week     Comment: socially 1 - 2 glasses of wine per week  . Drug Use: No  . Sexually Active: No   Other Topics Concern  . Not on file   Social History Narrative  . No narrative on file    Past Surgical Hx:  Past Surgical History  Procedure Laterality Date  . Endometrial biopsy  2002    Uterine  polyps, Dr. Eda Paschal  . Colonoscopy  [id number], [id number]    polyps 2013 only, Dr. Russella Dar  . Cesarean section  1976  . Dilation and curettage of uterus    . Hysteroscopy    . Breast biopsy  01/09/2012    Procedure: BREAST BIOPSY WITH NEEDLE LOCALIZATION;  Surgeon: Velora Heckler, MD;  Location: [facility];  Service: General;  Laterality: Right;  . Abdominal hysterectomy N/A 03/02/2013    Procedure: TOTAL HYSTERECTOMY ABDOMINAL;  Surgeon: Rejeana Brock A. Duard Brady, MD;  Location: [facility];  Service: Gynecology;  Laterality: N/A;  . Salpingoophorectomy Bilateral 03/02/2013    Procedure: BILATERAL SALPINGO OOPHORECTOMY;  Surgeon: Rejeana Brock A. Duard Brady, MD;  Location: [facility];  Service: Gynecology;  Laterality: Bilateral;    Past Medical Hx:  Past Medical History  Diagnosis Date  . Hyperlipidemia   . Microcalcifications of the breast     atypical ductal hyperplasia with calcifications  . Breast lobular neoplasia     atypical  . Fibrocystic breast changes   . Osteopenia     Dr Lily Peer  . PMB (postmenopausal bleeding)     Dr Lily Peer  . Atrophic vaginitis   . Seasonal allergies   . Heart murmur     grade 1  per patient   . Arthritis     osteoarthritis     Family Hx:  Family History  Problem Relation Age of Onset  . Alcohol abuse Mother   . Cancer  Father     bone marrow cancer  . Alcohol abuse Father   . Von Willebrand disease Father   . Depression Son     Recovering alcoholic  . Depression Maternal Aunt   . Prostate cancer Maternal Uncle   . Diabetes Paternal Uncle   . Heart attack Paternal Uncle 51  . Cancer Paternal Grandmother     skin cancer  . Heart failure Paternal Grandmother     in 90s  . Colon cancer Neg Hx   .  Stomach cancer Neg Hx   . Stroke Neg Hx   . GER disease Paternal Grandmother     esophageal stricture  . GER disease Son     Vitals:  Blood pressure 118/68, pulse 80, temperature 98.5 F (36.9 C), resp. rate 18, height 5' 4.69" (1.643 m), weight 194 lb 4.8 oz (88.134 kg).  Physical Exam:  General: Well developed, well nourished female in no acute distress. Alert and oriented x 3.  Cardiovascular: Regular rate and rhythm. S1 and S2 normal.  Lungs: Clear to auscultation bilaterally. No wheezes/crackles/rhonchi noted.  Skin: No rashes or lesions present. Back: No CVA tenderness.  Abdomen: Abdomen soft, non-tender and non-obese.  20 staples removed from the midline incision without difficulty.  No drainage or erythema noted.  1/2 inch steri strips applied.   Extremities: No bilateral cyanosis, edema, or clubbing.   Assessment/Plan:  67 year old s/p total abdominal hysterectomy, bilateral salpingo-oophorectomy by Dr. Cleda Mccreedy on March 02, 2013.  Final pathology revealed 1. 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 - BENIGN FALLOPIAN TUBAL TISSUE, NO EVIDENCE OF ENDOMETRIOSIS, ATYPIA OR MALIGNANCY. 2. Uterus and cervix, with left tube and ovary - LEIOMYOMATA AND ADENOMYOSIS. - ENDOMETRIUM: BENIGN ENDOMETRIAL POLYP AND ADJACENT BENIGN PROLIFERATIVE ENDOMETRIUM, NO ATYPIA, HYPERPLASIA OR MALIGNANCY. - CERVIX: BENIGN SQUAMOUS MUCOSA AND ENDOCERVICAL MUCOSA, NO DYSPLASIA OR MALIGNANCY. - LEFT OVARY : OVARIAN FIBROMA, 3.2 CM, NO ATYPIA OR MALIGNANCY. - FALLOPIAN  TUBE: BENIGN FALLOPIAN TUBAL TISSUE, NO EVIDENCE OF ENDOMETRIOSIS, ATYPIA OR MALIGNANCY.  She is to return for post-operative follow up with Dr. Duard Brady on July 30.  Instructed to call for any questions or concerns.  Reportable signs and symptoms reviewed.       Margrete Delude DEAL, NP 03/09/2013, 3:30 PM

## 2013-03-09 NOTE — Telephone Encounter (Signed)
Error.  Emma Koble M. Heaton Sarin, MD, FACS [facility] Office: [phone]   

## 2013-03-10 ENCOUNTER — Telehealth (INDEPENDENT_AMBULATORY_CARE_PROVIDER_SITE_OTHER): Payer: Self-pay

## 2013-03-10 ENCOUNTER — Telehealth: Payer: Self-pay | Admitting: *Deleted

## 2013-03-10 ENCOUNTER — Other Ambulatory Visit: Payer: Self-pay | Admitting: *Deleted

## 2013-03-10 ENCOUNTER — Ambulatory Visit
Admission: RE | Admit: 2013-03-10 | Discharge: 2013-03-10 | Disposition: A | Discharge status: Home / Self Care | Payer: Medicare Other | Source: Ambulatory Visit | Attending: Surgery | Admitting: Surgery

## 2013-03-10 DIAGNOSIS — C50412 Malignant neoplasm of upper-outer quadrant of left female breast: Secondary | ICD-10-CM

## 2013-03-10 MED ORDER — GADOBENATE DIMEGLUMINE 529 MG/ML IV SOLN
18.0000 mL | Freq: Once | INTRAVENOUS | Status: AC | PRN
  Administered 2013-03-10: 18 mL via INTRAVENOUS

## 2013-03-10 NOTE — Telephone Encounter (Signed)
Misty Stanley from [facility] sent msg that appt has been made with Oncologist for 03-11-13. Pt is aware.

## 2013-03-10 NOTE — Telephone Encounter (Signed)
Per Dr Ardine Eng request msg sent to [facility] and Misty Stanley at [facility] to f/u on appt for pt with oncologist pre op.

## 2013-03-10 NOTE — Telephone Encounter (Signed)
Confirmed 03/11/13 appt w/ pt.  Unable to mail before appt letter - gave verbal.  Unable to mail packet - placed a request in [employer] Appts Notes for one to be given at time of check in.  Emailed Cindy at [facility] to make aware.  Took paperwork to [facility] for chart.

## 2013-03-11 ENCOUNTER — Encounter: Payer: Self-pay | Admitting: Oncology

## 2013-03-11 ENCOUNTER — Telehealth (INDEPENDENT_AMBULATORY_CARE_PROVIDER_SITE_OTHER): Payer: Self-pay

## 2013-03-11 ENCOUNTER — Ambulatory Visit (HOSPITAL_BASED_OUTPATIENT_CLINIC_OR_DEPARTMENT_OTHER): Payer: Medicare Other | Admitting: Oncology

## 2013-03-11 ENCOUNTER — Other Ambulatory Visit (HOSPITAL_BASED_OUTPATIENT_CLINIC_OR_DEPARTMENT_OTHER): Payer: 59 | Admitting: Lab

## 2013-03-11 ENCOUNTER — Ambulatory Visit: Payer: 59

## 2013-03-11 VITALS — BP 107/67 | HR 77 | Temp 98.5°F | Resp 20 | Ht 64.69 in | Wt 195.0 lb

## 2013-03-11 DIAGNOSIS — C50419 Malignant neoplasm of upper-outer quadrant of unspecified female breast: Secondary | ICD-10-CM

## 2013-03-11 DIAGNOSIS — C50412 Malignant neoplasm of upper-outer quadrant of left female breast: Secondary | ICD-10-CM

## 2013-03-11 LAB — COMPREHENSIVE METABOLIC PANEL (CC13)
AST: 14 U/L (ref 5–34)
BUN: 12.9 mg/dL (ref 7.0–26.0)
Calcium: 9.4 mg/dL (ref 8.4–10.4)
Chloride: 104 mEq/L (ref 98–109)
Creatinine: 0.8 mg/dL (ref 0.6–1.1)

## 2013-03-11 LAB — CBC WITH DIFFERENTIAL/PLATELET
Basophils Absolute: 0 10*3/uL (ref 0.0–0.1)
EOS%: 2.6 % (ref 0.0–7.0)
HCT: 38.3 % (ref 34.8–46.6)
HGB: 13 g/dL (ref 11.6–15.9)
MCH: 29.5 pg (ref 25.1–34.0)
MCV: 86.8 fL (ref 79.5–101.0)
MONO%: 6.3 % (ref 0.0–14.0)
NEUT%: 62.6 % (ref 38.4–76.8)
Platelets: 178 10*3/uL (ref 145–400)

## 2013-03-11 NOTE — Progress Notes (Signed)
Checked in new pt with no financial concerns. °

## 2013-03-11 NOTE — Telephone Encounter (Signed)
Pt notified of path result from mri bx. Pt will see oncologist this afternoon. Pt will call back after seeing oncology to let Dr Gerrit Friends know what treatment plan pt decides on.

## 2013-03-11 NOTE — Progress Notes (Signed)
[name] [id number] [birth year] 67 y.o. 03/11/2013 4:12 PM  CC  Marga Melnick, MD [phone] [facility] [address] [place] [address] Dr. Reynaldo Minium Dr. Cleda Mccreedy Dr. 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 Patient is seen in medical oncology for discussion of treatment options.  STAGE:  Left breast, multifocal 12:00 2.0 cm, ER+/PR+/H- 2:00 2.8 cm, ER+/P+/H+T2NxMx Stage II   REFERRING PHYSICIAN: Dr. Darnell Level   HISTORY OF PRESENT ILLNESS:  [name] is a 67 y.o. female.  Who has had a previous history of right breast excision last year and she underwent a biopsy that showed atypical ductal hyperplasia with calcifications but no malignancy. Most recently in June 2014 patient today routine self-examination that showed a mass in the left breast. She subsequently was seen and had a mammogram performed. She went on to have a core needle biopsy obtained on 2 lesions in the left breast the first was at the 12:00 position and a second at the 2:00 position. Her pathology at the 12:00 position revealed a 2.0 cm mass ER positive PR positive HER-2/neu negative. At that 2:00 position the tumor was 2.8 cm ER positive PR positive HER-2/neu positive. Patient was seen by Dr. Darnell Level for surgical evaluation since patient is well-known to him from prior excisional biopsy. Patient has had an MRI of the breasts performed that does show the disease in the left breast with additional disease extending toward the nipple areola complex. His there is also a suspicious area in the right breast possibly fat necrosis from prior surgery.a biopsy of this right lesion is recommended. And patient is scheduled for this. 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 She also had a hysterectomy. There was no malignancy noted. Patient's case was  discussed at the multidisciplinary breast conference. Her radiology and pathology were reviewed   Past Medical History: Past Medical History  Diagnosis Date  . Hyperlipidemia   . Microcalcifications of the breast     atypical ductal hyperplasia with calcifications  . Breast lobular neoplasia     atypical  . Fibrocystic breast changes   . Osteopenia     Dr Lily Peer  . PMB (postmenopausal bleeding)     Dr Lily Peer  . Atrophic vaginitis   . Seasonal allergies   . Heart murmur     grade 1  per patient   . Arthritis     osteoarthritis     Past Surgical History: Past Surgical History  Procedure Laterality Date  . Endometrial biopsy  2002    Uterine  polyps, Dr. Eda Paschal  . Colonoscopy  [id number], [id number]    polyps 2013 only, Dr. Russella Dar  . Cesarean section  1976  . Dilation and curettage of uterus    . Hysteroscopy    . Breast biopsy  01/09/2012    Procedure: BREAST BIOPSY WITH NEEDLE LOCALIZATION;  Surgeon: Velora Heckler, MD;  Location: [facility];  Service: General;  Laterality: Right;  . Abdominal hysterectomy N/A 03/02/2013    Procedure: TOTAL HYSTERECTOMY ABDOMINAL;  Surgeon: Rejeana Brock A. Duard Brady, MD;  Location: [facility];  Service: Gynecology;  Laterality: N/A;  . Salpingoophorectomy Bilateral 03/02/2013    Procedure: BILATERAL SALPINGO OOPHORECTOMY;  Surgeon: Rejeana Brock A. Duard Brady, MD;  Location: [facility];  Service: Gynecology;  Laterality: Bilateral;    Family History: Family History  Problem Relation Age of  Onset  . Alcohol abuse Mother   . Cancer Father     bone marrow cancer  . Alcohol abuse Father   . Von Willebrand disease Father   . Depression Son     Recovering alcoholic  . Depression Maternal Aunt   . Prostate cancer Maternal Uncle   . Diabetes Paternal Uncle   . Heart attack Paternal Uncle 51  . Cancer Paternal Grandmother     skin cancer  . Heart failure Paternal Grandmother     in 90s  . Colon cancer Neg Hx   . Stomach cancer Neg Hx   . Stroke Neg  Hx   . GER disease Paternal Grandmother     esophageal stricture  . GER disease Son     Social History History  Substance Use Topics  . Smoking status: Never Smoker   . Smokeless tobacco: Never Used     Comment: second hand smoke for decades  . Alcohol Use: 1.2 oz/week    2 Glasses of wine per week     Comment: socially 1 - 2 glasses of wine per week    Allergies: Allergies  Allergen Reactions  . Codeine Rash    Post C section    Current Medications: Current Outpatient Prescriptions  Medication Sig Dispense Refill  . Calcium 250 MG CAPS Take 1 capsule by mouth daily.      . cholecalciferol (VITAMIN D) 1000 UNITS tablet Take 1,000 Units by mouth daily.      . fluticasone (FLONASE) 50 MCG/ACT nasal spray Place 2 sprays into the nose daily as needed for rhinitis or allergies.      Marland Kitchen GLUCOSAMINE SULFATE PO Take 1 tablet by mouth daily.      Marland Kitchen loratadine (CLARITIN) 10 MG tablet Take 10 mg by mouth daily.      . Magnesium 500 MG TABS Take 500 mg by mouth daily.      . Multiple Vitamin (MULTIVITAMIN) tablet Take 1 tablet by mouth daily.        . Omega-3 Fatty Acids (FISH OIL) 1000 MG CAPS Take by mouth daily.        Bertram Gala Glycol-Propyl Glycol (SYSTANE) 0.4-0.3 % SOLN Apply to eye. 2-3 x daily       . Vitamin D, Ergocalciferol, (DRISDOL) 50000 UNITS CAPS Take 50,000 Units by mouth every 14 (fourteen) days.      . NONFORMULARY OR COMPOUNDED ITEM Estradiol .02% 1 ML Prefilled Applicator Sig: apply vaginally twice a week #90 Day Supply with 4 refills  1 each  4  . traMADol (ULTRAM) 50 MG tablet Take 1 tablet (50 mg total) by mouth every 6 (six) hours as needed for pain.  10 tablet  0  . VALERIAN ROOT PO Take by mouth at bedtime as needed.      . zolpidem (AMBIEN) 5 MG tablet Take 1 tablet (5 mg total) by mouth at bedtime as needed for sleep.  10 tablet  0   No current facility-administered medications for this visit.    OB/GYN History:menarche at age 67, patient underwent  menopause in her mid 73s. She had been on hormone replacement therapy for about 5 years. She's had 1 term birth at the age of 56.  Fertility Discussion: not applicable Prior History of Cancer: no  Health Maintenance:  Colonoscopy yes Bone Density yes Last PAP smear 2013  ECOG PERFORMANCE STATUS: 0 - Asymptomatic  Genetic Counseling/testing:yes  REVIEW OF SYSTEMS: Comprehensive 14 point review of system was obtained and  was negative and a discount separately into the electronic medical record  PHYSICAL EXAMINATION: Blood pressure 107/67, pulse 77, temperature 98.5 F (36.9 C), temperature source Oral, resp. rate 20, height 5' 4.69" (1.643 m), weight 195 lb (88.451 kg). Patient is well-developed well-nourished female in no acute distress HEENT exam EOMI PERRLA sclerae anicteric no conjunctival pallor oral mucosa is moist neck is supple lungs are clear bilaterally to auscultation and percussion cardiovascular is regular rate rhythm no murmurs gallops or rubs abdomen is soft nontender nondistended bowel sounds are present no hepatosplenomegaly extremities no edema neuro nonfocal Left breast exam reveals palpable mass no nipple discharge or skin changes Right breast no masses palpable.     STUDIES/RESULTS: Mr Breast Bilateral W Wo Contrast  02/25/2013   *RADIOLOGY REPORT*  Clinical Data: Patient with history of two recent biopsy-proven invasive mammary carcinoma/MCIS at the 12 o'clock and 2 o'clock locations of the left breast. History of excisional right breast biopsy for [facility] June 2013.  BUN/creatinine 14 and 0.82 from 02/04/2013  BILATERAL BREAST MRI WITH AND WITHOUT CONTRAST  Technique: Multiplanar, multisequence MR images of both breasts were obtained prior to and following the intravenous administration of 19ml of multihance.  Three dimensional images were evaluated at the independent DynaCad workstation.  Comparison:  Recent mammograms and ultrasound.  Findings: Exam demonstrates  symmetric mild background parenchymal enhancement.  Left breast:  Exam demonstrates evidence of patient's biopsy-proven malignancy at approximately the 12 o'clock position with the central clip artifact present.  This mass measures 1.4 x 1.5 x 2.0 cm.  There clumped non mass enhancement anterior to this mass extending towards the retroareolar region.  There is also a small focus of non mass enhancement approximately 2 cm posterior to this mass.  Patient's second biopsy-proven malignancy is seen over the approximate 2 o'clock position of the left breast measuring 1.5 x 1.5 x 2.8 cm with central clip artifact.  Immediately inferior lateral to this mass is a focus of non mass enhancement measuring approximately 0.8 x 1.2 x 2.4 cm. The two biopsy-proven malignancies are located approximately 2.3 cm apart.  Right breast: There is an oval somewhat irregular bordered enhancing mass with single internal dark septation measuring 0.6 x 0.8 x 1.4 cm at approximate the 12-1 o'clock position.  This has areas of fat signal on the T1-weighted images and likely represents a focus of fat necrosis in the area of patient's prior excisional biopsy, however, this is indeterminate. This is located approximately 6 cm from the nipple.  Remainder of the  right breast is unremarkable.  No significant adenopathy.  IMPRESSION: Evidence of multifocal and possible multicentric disease with known biopsy-proven malignancy over the 12 o'clock position of the left breast with areas of clumped non mass enhancement both posterior and anterior to this mass extending towards the retroareolar region.  Known biopsy-proven malignancy over the 2 o'clock position of the left breast with an area of non mass enhancement inferior lateral to this mass measuring 0.8 x 1.2 x 2.4 cm.  Indeterminate mass over the 12-1 o'clock position of the right breast measuring 0.6 x 0.8 x 1.4 cm with features suggesting fat necrosis, but remains indeterminate. This is in the  area of patient's prior excisional biopsy for ADH.  RECOMMENDATION: Would recommend foregoing second look ultrasound of the right breast and proceeding with MR guided biopsy of the indeterminate right breast mass.  THREE-DIMENSIONAL MR IMAGE RENDERING ON INDEPENDENT WORKSTATION:  Three-dimensional MR images were rendered by post-processing of the original MR data  on an independent workstation.  The three- dimensional MR images were interpreted, and findings were reported in the accompanying complete MRI report for this study.  BI-RADS CATEGORY 4:  Suspicious abnormality - biopsy should be considered.   Original Report Authenticated By: Elberta Fortis, M.D.   Mm Digital Diagnostic Unilat R  03/10/2013   *RADIOLOGY REPORT*  Clinical Data:  abnormal right breast enhancement  DIGITAL DIAGNOSTIC RIGHT MAMMOGRAM  Comparison:  Previous exams.  Findings:  Films are performed following MRI guided biopsy of abnormal enhancement in the 12 o'clock position of the right breast.  Marker clip projects in the anticipated position.  IMPRESSION: Appropriate clip placement.   Original Report Authenticated By: Esperanza Heir, M.D.   Mr Rt Breast Bx Jones Bales Dev 1st Lesion Image Bx Spec Mr Guide  03/10/2013   *RADIOLOGY REPORT*  Clinical Data:  multifocal left breast carcinoma; history of right breast excisional biopsy demonstrating ADH, with abnormal enhancement in the right breast near the biopsy site  MRI GUIDED VACUUM ASSISTED BIOPSY OF THE RIGHT BREAST WITHOUT AND WITH CONTRAST  Comparison: Previous exams.  Technique: Multiplanar, multisequence MR images of the right breast were obtained prior to and following the intravenous administration of 18 ml of Mulithance.  I met with the patient, and we discussed the procedure of MRI guided biopsy, including risks, benefits, and alternatives. Specifically, we discussed the risks of infection, bleeding, tissue injury, clip migration, and inadequate sampling.  Informed, written consent was  given.  Using sterile technique, 2% Lidocaine, MRI guidance, and a 9 gauge vacuum assisted device, biopsy was performed of the right breast using a lateromedial approach.  At the conclusion of the procedure, a  tissue marker clip was deployed into the biopsy cavity.  IMPRESSION: MRI guided biopsy of the right breast. No apparent complications.  THREE-DIMENSIONAL MR IMAGE RENDERING ON INDEPENDENT WORKSTATION:  Three-dimensional MR images were rendered by post-processing of the original MR data on an independent workstation.  The three- dimensional MR images were interpreted, and findings were reported in the accompanying complete MRI report for this study.   Original Report Authenticated By: Esperanza Heir, M.D.     LABS:    Chemistry      Component Value Date/Time   NA 140 03/11/2013 1443   NA 135 03/03/2013 0425   K 3.8 03/11/2013 1443   K 4.2 03/03/2013 0425   CL 101 03/03/2013 0425   CO2 29 03/11/2013 1443   CO2 28 03/03/2013 0425   BUN 12.9 03/11/2013 1443   BUN 9 03/03/2013 0425   CREATININE 0.8 03/11/2013 1443   CREATININE 0.71 03/03/2013 0425   CREATININE 0.82 01/27/2013 1113      Component Value Date/Time   CALCIUM 9.4 03/11/2013 1443   CALCIUM 9.1 03/03/2013 0425   ALKPHOS 83 03/11/2013 1443   ALKPHOS 99 02/26/2013 0820   AST 14 03/11/2013 1443   AST 21 02/26/2013 0820   ALT 10 03/11/2013 1443   ALT 15 02/26/2013 0820   BILITOT 0.52 03/11/2013 1443   BILITOT 0.7 02/26/2013 0820      Lab Results  Component Value Date   WBC 6.8 03/11/2013   HGB 13.0 03/11/2013   HCT 38.3 03/11/2013   MCV 86.8 03/11/2013   PLT 178 03/11/2013   PATHOLOGY: ADDITIONAL INFORMATION: 1. CHROMOGENIC IN-SITU HYBRIDIZATION (PART 1) Results: HER2/NEU BY CISH - SHOWS AMPLIFICATION BY CISH ANALYSIS. RESULT RATIO OF HER2: CEP 17 SIGNALS 3.00 AVERAGE HER2 COPY NUMBER PER CELL 4.20 REFERENCE RANGE NEGATIVE HER2/Chr17 Ratio <2.0  and Average HER2 copy number <4.0 EQUIVOCAL HER2/Chr17 Ratio <2.0 and Average HER2 copy  number 4.0 and <6.0 POSITIVE HER2/Chr17 Ratio >=2.0 and/or Average HER2 copy number >=6.0 Pecola Leisure MD Pathologist, Electronic Signature ( Signed 02/23/2013) 1. PROGNOSTIC INDICATORS - ACIS Results: IMMUNOHISTOCHEMICAL AND MORPHOMETRIC ANALYSIS BY THE AUTOMATED CELLULAR IMAGING SYSTEM (ACIS) Estrogen Receptor: 100%, POSITIVE, STRONG STAINING INTENSITY Progesterone Receptor: 69%, POSITIVE, MODERATE STAINING INTENSITY Proliferation Marker Ki67: 19% REFERENCE RANGE ESTROGEN RECEPTOR NEGATIVE <1% POSITIVE =>1% PROGESTERONE RECEPTOR NEGATIVE <1% 1 of 4 FINAL for [name], [name] ([id number]) ADDITIONAL INFORMATION:(continued) POSITIVE =>1% All controls stained appropriately Jimmy Picket MD Pathologist, Electronic Signature ( Signed 02/19/2013) 2. CHROMOGENIC IN-SITU HYBRIDIZATION (PART 2) Results: HER-2/NEU BY CISH - NO AMPLIFICATION OF HER-2 DETECTED. RESULT RATIO OF HER2: CEP 17 SIGNALS 1.45 AVERAGE HER2 COPY NUMBER PER CELL 3.40 REFERENCE RANGE NEGATIVE HER2/Chr17 Ratio <2.0 and Average HER2 copy number <4.0 EQUIVOCAL HER2/Chr17 Ratio <2.0 and Average HER2 copy number 4.0 and <6.0 POSITIVE HER2/Chr17 Ratio >=2.0 and/or Average HER2 copy number >=6.0 Pecola Leisure MD Pathologist, Electronic Signature ( Signed 02/23/2013) 2. PROGNOSTIC INDICATORS - ACIS Results: IMMUNOHISTOCHEMICAL AND MORPHOMETRIC ANALYSIS BY THE AUTOMATED CELLULAR IMAGING SYSTEM (ACIS) Estrogen Receptor: 100%, POSITIVE, STRONG STAINING INTENSITY Progesterone Receptor: 71%, POSITIVE, MODERATE STAINING INTENSITY Proliferation Marker Ki67: 34% REFERENCE RANGE ESTROGEN RECEPTOR NEGATIVE <1% POSITIVE =>1% PROGESTERONE RECEPTOR NEGATIVE <1% POSITIVE =>1% All controls stained appropriately Jimmy Picket MD Pathologist, Electronic Signature ( Signed 02/19/2013) 2 of 4 FINAL for [name], [name] [phone]) FINAL DIAGNOSIS Diagnosis 1. Breast, left, needle core biopsy, (A) mass, 2 o'clock - INVASIVE  MAMMARY CARCINOMA. - ASSOCIATED MAMMARY CARCINOMA IN SITU. - SEE COMMENT. 2. Breast, left, needle core biopsy, (B) mass, 12 o'clock - INVASIVE MAMMARY CARCINOMA. - ASSOCIATED MAMMARY CARCINOMA IN SITU. - SEE COMMENT. Microscopic Comment 1. Although definitive grading of breast carcinoma is best done on excision, the features of the invasive tumor from the left 2 o'clock are compatible with a grade II breast carcinoma. Breast prognostic markers will be performed and reported in an addendum. Findings are called to [employer] on 02/17/13. Dr.Kish has seen this case in consultation with agreement. 2. Although definitive grading of breast carcinoma is best done on excision, the features of the invasive tumor from the left 12 o'clock are compatible with a grade II breast carcinoma. Although a ductal carcinoma is favored, definitive typing of breast carcinoma is also best done on excision specimen. Breast prognostic markers will be performed and reported in an addendum. Findings are called to [employer] on 02/17/13. Dr.Kish has seen this case in consultation with agreement. (RAH:caf 02/17/13)   ASSESSMENT    67 year old female with  #1 multifocal left breast cancer with largest measuring 2.8 cm. 1 mass is ER positive PR positive HER-2/neu positive. The second mass is ER positive PR positive but HER-2/neu negative. Patient most likely will need a mastectomy with sentinel lymph node biopsy. We discussed this in detail.  #2 patient will also need a biopsy of the right mass that was found on her recent MRI.  #3 patient and I discussed her treatment options she will require chemotherapy we discussed HER-2 based systemic adjuvant chemotherapy with Taxotere carboplatinum and Herceptin. We discussed the rationale. We discussed the benefits. Certainly she will need a Port-A-Cath placed.  #4 she will need an echocardiogram and we will send her to Dr. Gala Romney as well. She will need  chemotherapy teaching class.  Clinical Trial Eligibility:no Multidisciplinary conference discussion yes  PLAN:    #1 patient will proceed with bilateral mastectomies with delayed reconstruction.  #2 we discussed chemotherapy and Herceptin. Her chemotherapy would be of Taxotere carboplatinum given every 3 weeks for a total of 6 cycles with Neulasta support. Herceptin will initially be every week for duration of chemotherapy and then thereafter she will go to every 3 weeks. We discussed the rationale side effects of this as well.  #3 patient will need chemotherapy teaching class echocardiogram and Port-A-Cath placement        Discussion: Patient is being treated per NCCN breast cancer care guidelines appropriate for stage.II   Thank you so much for allowing me to participate in the care of [name]. I will continue to follow up the patient with you and assist in her care.  All questions were answered. The patient knows to call the clinic with any problems, questions or concerns. We can certainly see the patient much sooner if necessary.  I spent 60 minutes counseling the patient face to face. The total time spent in the appointment was 60 minutes.  Drue Second, MD Medical/Oncology [facility] [phone] (beeper) [phone] (Office)  03/11/2013, 4:13 PM

## 2013-03-16 ENCOUNTER — Telehealth (INDEPENDENT_AMBULATORY_CARE_PROVIDER_SITE_OTHER): Payer: Self-pay | Admitting: General Surgery

## 2013-03-16 NOTE — Telephone Encounter (Signed)
Patient calling in today - she is ready to schedule surgery. She is wanting to proceed with bilateral mastectomies - no reconstruction for now and she needs a PAC placed. She is anxious to get a date scheduled asap. Please send orders to our scheduling department.

## 2013-03-17 ENCOUNTER — Encounter (INDEPENDENT_AMBULATORY_CARE_PROVIDER_SITE_OTHER): Payer: Self-pay | Admitting: Surgery

## 2013-03-17 ENCOUNTER — Telehealth (INDEPENDENT_AMBULATORY_CARE_PROVIDER_SITE_OTHER): Payer: Self-pay | Admitting: Surgery

## 2013-03-17 ENCOUNTER — Other Ambulatory Visit (INDEPENDENT_AMBULATORY_CARE_PROVIDER_SITE_OTHER): Payer: Self-pay | Admitting: Surgery

## 2013-03-17 DIAGNOSIS — C50412 Malignant neoplasm of upper-outer quadrant of left female breast: Secondary | ICD-10-CM

## 2013-03-17 DIAGNOSIS — N6099 Unspecified benign mammary dysplasia of unspecified breast: Secondary | ICD-10-CM

## 2013-03-17 NOTE — Telephone Encounter (Signed)
I called the patient to discuss recent evaluations and results. Patient's pelvic surgery has gone well. She is recovering without complication. Final pathology was benign.  Patient underwent guided biopsy of the right breast. Final pathology shows fat necrosis.  Patient has been seen by medical oncology. Patient is anxious to proceed with surgery.  The patient and I discussed left mastectomy with sentinel lymph node biopsy. She also wishes to undergo a right mastectomy for prophylaxis given her history of atypical ductal hyperplasia.  I told the patient that I would like to perform her Port-A-Cath placement as a separate operative procedure once she has recovered from her bilateral mastectomy.  Patient is in agreement and we will proceed with scheduling her for surgery in the near future.  The risks and benefits of the procedure have been discussed at length with the patient.  The patient understands the proposed procedure, potential alternative treatments, and the course of recovery to be expected.  All of the patient's questions have been answered at this time.  The patient wishes to proceed with surgery.  Velora Heckler, MD, [facility] Office: [phone]

## 2013-03-20 ENCOUNTER — Encounter: Payer: Self-pay | Admitting: Internal Medicine

## 2013-03-22 ENCOUNTER — Encounter: Payer: Self-pay | Admitting: *Deleted

## 2013-03-22 ENCOUNTER — Other Ambulatory Visit (INDEPENDENT_AMBULATORY_CARE_PROVIDER_SITE_OTHER): Payer: Self-pay | Admitting: Surgery

## 2013-03-22 DIAGNOSIS — C50412 Malignant neoplasm of upper-outer quadrant of left female breast: Secondary | ICD-10-CM

## 2013-03-22 DIAGNOSIS — C50912 Malignant neoplasm of unspecified site of left female breast: Secondary | ICD-10-CM

## 2013-03-25 ENCOUNTER — Other Ambulatory Visit: Payer: Self-pay | Admitting: *Deleted

## 2013-03-25 ENCOUNTER — Telehealth: Payer: Self-pay | Admitting: Medical Oncology

## 2013-03-25 DIAGNOSIS — C50419 Malignant neoplasm of upper-outer quadrant of unspecified female breast: Secondary | ICD-10-CM

## 2013-03-25 NOTE — Telephone Encounter (Signed)
Patient LVMOM with questions regarding diagnostics. Mssg given to Marianne Sofia, nurse navigator for f/u.

## 2013-03-26 ENCOUNTER — Telehealth: Payer: Self-pay | Admitting: Oncology

## 2013-03-26 ENCOUNTER — Telehealth: Payer: Self-pay | Admitting: *Deleted

## 2013-03-26 NOTE — Telephone Encounter (Signed)
Pt check with pharmacy pneumonia vaccine not covered at pharmacy will need to get at office. Pt would like a call back to discuss how she can get injection. Left Pt detail VM that all she would need to do is call and schedule nurse visit for injection.

## 2013-03-29 ENCOUNTER — Telehealth: Payer: Self-pay | Admitting: *Deleted

## 2013-03-29 NOTE — Telephone Encounter (Signed)
Lm gv appt d/t for echo and to see bensimhon. i also gv appt d/t for chemo edu for 04/06/13  @ 12:30pm. i made the pt aware that i will mail a letter/avs as well...td

## 2013-03-30 ENCOUNTER — Encounter: Payer: Self-pay | Admitting: *Deleted

## 2013-03-30 NOTE — Progress Notes (Signed)
Mailed after appt letter to pt. 

## 2013-03-31 ENCOUNTER — Ambulatory Visit (HOSPITAL_COMMUNITY)
Admission: RE | Admit: 2013-03-31 | Discharge: 2013-03-31 | Disposition: A | Discharge status: Home / Self Care | Payer: Medicare Other | Source: Ambulatory Visit | Attending: Oncology | Admitting: Oncology

## 2013-03-31 DIAGNOSIS — Z01818 Encounter for other preprocedural examination: Secondary | ICD-10-CM | POA: Insufficient documentation

## 2013-03-31 DIAGNOSIS — C50919 Malignant neoplasm of unspecified site of unspecified female breast: Secondary | ICD-10-CM | POA: Insufficient documentation

## 2013-03-31 DIAGNOSIS — I519 Heart disease, unspecified: Secondary | ICD-10-CM | POA: Insufficient documentation

## 2013-03-31 DIAGNOSIS — C50419 Malignant neoplasm of upper-outer quadrant of unspecified female breast: Secondary | ICD-10-CM

## 2013-03-31 NOTE — Progress Notes (Signed)
  Echocardiogram 2D Echocardiogram has been performed.  [name] 03/31/2013, 1:48 PM

## 2013-04-01 ENCOUNTER — Encounter (HOSPITAL_BASED_OUTPATIENT_CLINIC_OR_DEPARTMENT_OTHER): Payer: Self-pay | Admitting: *Deleted

## 2013-04-01 ENCOUNTER — Telehealth: Payer: Self-pay | Admitting: *Deleted

## 2013-04-01 NOTE — Progress Notes (Signed)
No labs needed-pt was here 4/13 for rt lumpectomy-will bring any meds and overnight bag-she was a nursing instructor-retired.

## 2013-04-01 NOTE — Telephone Encounter (Signed)
Called pt and informed her that on 04/06/13 her chemo edu class starts @ 12noon. Pt will be here @ 12noon...td

## 2013-04-05 ENCOUNTER — Encounter (HOSPITAL_COMMUNITY): Payer: Self-pay

## 2013-04-05 ENCOUNTER — Ambulatory Visit (HOSPITAL_COMMUNITY)
Admission: RE | Admit: 2013-04-05 | Discharge: 2013-04-05 | Disposition: A | Discharge status: Home / Self Care | Payer: Medicare Other | Source: Ambulatory Visit | Attending: Internal Medicine | Admitting: Internal Medicine

## 2013-04-05 ENCOUNTER — Telehealth (INDEPENDENT_AMBULATORY_CARE_PROVIDER_SITE_OTHER): Payer: Self-pay

## 2013-04-05 VITALS — BP 106/64 | HR 81 | Wt 194.4 lb

## 2013-04-05 DIAGNOSIS — C50412 Malignant neoplasm of upper-outer quadrant of left female breast: Secondary | ICD-10-CM

## 2013-04-05 DIAGNOSIS — C50419 Malignant neoplasm of upper-outer quadrant of unspecified female breast: Secondary | ICD-10-CM

## 2013-04-05 NOTE — Patient Instructions (Addendum)
Follow up in 3 months with an ECHO 

## 2013-04-05 NOTE — Telephone Encounter (Signed)
Pt calling asking if Dr. Gerrit Friends prefers his maste pt's to wear a camisole or sports bra after Sx.  Explained to pt that she will be placed in a pink binder after Sx and that she needs to keep he binder on until her post op visit with Dr Gerrit Friends and that he will make a decision at that time.  Pt understood

## 2013-04-05 NOTE — Progress Notes (Signed)
Patient ID: [name], female   DOB: 01/15/1946, 67 y.o.   MRN: [id number] Oncologist: Dr. Welton Flakes PCP: Dr. Alwyn Ren  67 yo with history of newly-diagnosed breast cancer presents for cardiology evaluation pre-Herceptin use.  Patient had ER+/PR+/HER2-neu+ left breast cancer diagnosed in 6/14.  She will have bilateral mastectomies later this week.  She will then have taxotere/carboplatin x 6 cycles.  Herceptin weekly then every 3 months.  She is doing well symptomatically.  No exertional dyspnea or chest pain.  She walks 1-2 miles/day.  No tachypalpitations.  No prior cardiac problems.   I reviewed her echo today.  This showed EF 55-60%, lateral s' 11.6 cm/sec, normal RV.    Labs (6/14): K 3.8, creatinine 0.8  PMH: 1. Fibroids s/p hysterectomy 2. Benign ovarian mass s/p BSO.  3. Breast cancer: Diagnosed in left breast in 6/14.  ER+/PR+/HER2-neu+, Stage II.   4. Echo (7/14) with EF 55-60%, normal RV size and systolic function, lateral s' 78.2  SH: Nonsmoker, rare ETOH, lives in [place].  FH: Paternal grandmother with "heart problem."  Maternal grandmother with SCD at age 54.   ROS: All systems reviewed and negative except as per HPI.   Current Outpatient Prescriptions  Medication Sig Dispense Refill  . Calcium 250 MG CAPS Take 1 capsule by mouth daily.      . cholecalciferol (VITAMIN D) 1000 UNITS tablet Take 1,000 Units by mouth daily.      . fluticasone (FLONASE) 50 MCG/ACT nasal spray Place 2 sprays into the nose daily as needed for rhinitis or allergies.      Marland Kitchen GLUCOSAMINE SULFATE PO Take 1 tablet by mouth daily.      Marland Kitchen loratadine (CLARITIN) 10 MG tablet Take 10 mg by mouth daily.      . Magnesium 500 MG TABS Take 500 mg by mouth daily.      . Multiple Vitamin (MULTIVITAMIN) tablet Take 1 tablet by mouth daily.        . NONFORMULARY OR COMPOUNDED ITEM Estradiol .02% 1 ML Prefilled Applicator Sig: apply vaginally twice a week #90 Day Supply with 4 refills  1 each  4  . Polyethyl  Glycol-Propyl Glycol (SYSTANE) 0.4-0.3 % SOLN Apply to eye. 2-3 x daily       . traMADol (ULTRAM) 50 MG tablet Take 1 tablet (50 mg total) by mouth every 6 (six) hours as needed for pain.  10 tablet  0  . VALERIAN ROOT PO Take by mouth at bedtime as needed.      . Vitamin D, Ergocalciferol, (DRISDOL) 50000 UNITS CAPS Take 50,000 Units by mouth every 14 (fourteen) days.      Marland Kitchen zolpidem (AMBIEN) 5 MG tablet Take 1 tablet (5 mg total) by mouth at bedtime as needed for sleep.  10 tablet  0  . Omega-3 Fatty Acids (FISH OIL) 1000 MG CAPS Take by mouth daily.         No current facility-administered medications for this encounter.    BP 106/64  Pulse 81  Wt 194 lb 6.4 oz (88.179 kg)  BMI 32.35 kg/m2  SpO2 100% General: NAD Neck: No JVD, no thyromegaly or thyroid nodule.  Lungs: Clear to auscultation bilaterally with normal respiratory effort. CV: Nondisplaced PMI.  Heart regular S1/S2, no S3/S4, no murmur.  No peripheral edema.  No carotid bruit.  Normal pedal pulses.  Abdomen: Soft, nontender, no hepatosplenomegaly, no distention.  Skin: Intact without lesions or rashes.  Neurologic: Alert and oriented x 3.  Psych: Normal  affect. Extremities: No clubbing or cyanosis.  HEENT: Normal.   Assessment/Plan: 67 yo with HER2-neu+ breast cancer will be starting Herceptin after her mastectomy later this week.  I reviewed her echo today for a baseline.  I will get her first echo 2 months after starting Herceptin then q3 months if stable.  She needs mastectomy lateral this week.  She does not need further cardiac workup prior to surgery.   Marca Ancona 04/06/2013

## 2013-04-06 ENCOUNTER — Other Ambulatory Visit: Payer: Medicare Other

## 2013-04-06 ENCOUNTER — Encounter: Payer: Self-pay | Admitting: *Deleted

## 2013-04-08 ENCOUNTER — Ambulatory Visit (HOSPITAL_BASED_OUTPATIENT_CLINIC_OR_DEPARTMENT_OTHER)
Admission: RE | Admit: 2013-04-08 | Discharge: 2013-04-09 | Disposition: A | Discharge status: Home / Self Care | Payer: Medicare Other | Source: Ambulatory Visit | Attending: Surgery | Admitting: Surgery

## 2013-04-08 ENCOUNTER — Ambulatory Visit (HOSPITAL_BASED_OUTPATIENT_CLINIC_OR_DEPARTMENT_OTHER): Payer: Medicare Other | Admitting: *Deleted

## 2013-04-08 ENCOUNTER — Encounter (HOSPITAL_BASED_OUTPATIENT_CLINIC_OR_DEPARTMENT_OTHER)
Admission: RE | Disposition: A | Discharge status: Home / Self Care | Payer: Self-pay | Source: Ambulatory Visit | Attending: Surgery

## 2013-04-08 ENCOUNTER — Encounter (HOSPITAL_BASED_OUTPATIENT_CLINIC_OR_DEPARTMENT_OTHER): Payer: Self-pay | Admitting: *Deleted

## 2013-04-08 ENCOUNTER — Encounter (HOSPITAL_COMMUNITY)
Admission: RE | Admit: 2013-04-08 | Discharge: 2013-04-08 | Disposition: A | Discharge status: Home / Self Care | Payer: Medicare Other | Source: Ambulatory Visit | Attending: Surgery | Admitting: Surgery

## 2013-04-08 DIAGNOSIS — N6019 Diffuse cystic mastopathy of unspecified breast: Secondary | ICD-10-CM

## 2013-04-08 DIAGNOSIS — Z170 Estrogen receptor positive status [ER+]: Secondary | ICD-10-CM | POA: Insufficient documentation

## 2013-04-08 DIAGNOSIS — C50919 Malignant neoplasm of unspecified site of unspecified female breast: Secondary | ICD-10-CM | POA: Insufficient documentation

## 2013-04-08 DIAGNOSIS — Z79899 Other long term (current) drug therapy: Secondary | ICD-10-CM | POA: Insufficient documentation

## 2013-04-08 DIAGNOSIS — J301 Allergic rhinitis due to pollen: Secondary | ICD-10-CM | POA: Insufficient documentation

## 2013-04-08 DIAGNOSIS — C773 Secondary and unspecified malignant neoplasm of axilla and upper limb lymph nodes: Secondary | ICD-10-CM | POA: Insufficient documentation

## 2013-04-08 DIAGNOSIS — C50419 Malignant neoplasm of upper-outer quadrant of unspecified female breast: Secondary | ICD-10-CM | POA: Insufficient documentation

## 2013-04-08 DIAGNOSIS — R920 Mammographic microcalcification found on diagnostic imaging of breast: Secondary | ICD-10-CM

## 2013-04-08 DIAGNOSIS — E785 Hyperlipidemia, unspecified: Secondary | ICD-10-CM | POA: Insufficient documentation

## 2013-04-08 DIAGNOSIS — N6089 Other benign mammary dysplasias of unspecified breast: Secondary | ICD-10-CM | POA: Insufficient documentation

## 2013-04-08 DIAGNOSIS — R011 Cardiac murmur, unspecified: Secondary | ICD-10-CM | POA: Insufficient documentation

## 2013-04-08 DIAGNOSIS — N641 Fat necrosis of breast: Secondary | ICD-10-CM | POA: Insufficient documentation

## 2013-04-08 DIAGNOSIS — N952 Postmenopausal atrophic vaginitis: Secondary | ICD-10-CM | POA: Insufficient documentation

## 2013-04-08 DIAGNOSIS — M1990 Unspecified osteoarthritis, unspecified site: Secondary | ICD-10-CM | POA: Insufficient documentation

## 2013-04-08 DIAGNOSIS — Z885 Allergy status to narcotic agent status: Secondary | ICD-10-CM | POA: Insufficient documentation

## 2013-04-08 DIAGNOSIS — Z808 Family history of malignant neoplasm of other organs or systems: Secondary | ICD-10-CM | POA: Insufficient documentation

## 2013-04-08 DIAGNOSIS — D0590 Unspecified type of carcinoma in situ of unspecified breast: Secondary | ICD-10-CM

## 2013-04-08 DIAGNOSIS — C50412 Malignant neoplasm of upper-outer quadrant of left female breast: Secondary | ICD-10-CM

## 2013-04-08 DIAGNOSIS — C50912 Malignant neoplasm of unspecified site of left female breast: Secondary | ICD-10-CM

## 2013-04-08 DIAGNOSIS — N6099 Unspecified benign mammary dysplasia of unspecified breast: Secondary | ICD-10-CM | POA: Diagnosis present

## 2013-04-08 HISTORY — PX: AXILLARY LYMPH NODE BIOPSY: SHX5737

## 2013-04-08 HISTORY — DX: Presence of spectacles and contact lenses: Z97.3

## 2013-04-08 HISTORY — PX: TOTAL MASTECTOMY: SHX6129

## 2013-04-08 LAB — POCT HEMOGLOBIN-HEMACUE: Hemoglobin: 13.9 g/dL (ref 12.0–15.0)

## 2013-04-08 SURGERY — MASTECTOMY, SIMPLE
Anesthesia: General | Site: Breast | Laterality: Left | Wound class: Clean

## 2013-04-08 MED ORDER — ACETAMINOPHEN 325 MG PO TABS: 650.0000 mg | ORAL_TABLET | ORAL | Status: DC | PRN

## 2013-04-08 MED ORDER — TRAMADOL HCL 50 MG PO TABS: 50.0000 mg | ORAL_TABLET | Freq: Four times a day (QID) | ORAL | Status: DC | PRN

## 2013-04-08 MED ORDER — SCOPOLAMINE 1 MG/3DAYS TD PT72
1.0000 | MEDICATED_PATCH | Freq: Once | TRANSDERMAL | Status: DC
  Administered 2013-04-08: 1.5 mg via TRANSDERMAL

## 2013-04-08 MED ORDER — FENTANYL CITRATE 0.05 MG/ML IJ SOLN
INTRAMUSCULAR | Status: DC | PRN
  Administered 2013-04-08: 50 ug via INTRAVENOUS
  Administered 2013-04-08: 25 ug via INTRAVENOUS
  Administered 2013-04-08: 50 ug via INTRAVENOUS
  Administered 2013-04-08: 25 ug via INTRAVENOUS
  Administered 2013-04-08: 50 ug via INTRAVENOUS

## 2013-04-08 MED ORDER — FENTANYL CITRATE 0.05 MG/ML IJ SOLN
50.0000 ug | INTRAMUSCULAR | Status: DC | PRN
  Administered 2013-04-08: 100 ug via INTRAVENOUS

## 2013-04-08 MED ORDER — MIDAZOLAM HCL 2 MG/2ML IJ SOLN
1.0000 mg | INTRAMUSCULAR | Status: DC | PRN
  Administered 2013-04-08: 2 mg via INTRAVENOUS

## 2013-04-08 MED ORDER — HYDROCODONE-ACETAMINOPHEN 5-325 MG PO TABS
1.0000 | ORAL_TABLET | ORAL | Status: DC | PRN
  Administered 2013-04-08 – 2013-04-09 (×4): 2 via ORAL

## 2013-04-08 MED ORDER — ONDANSETRON HCL 4 MG PO TABS: 4.0000 mg | ORAL_TABLET | Freq: Four times a day (QID) | ORAL | Status: DC | PRN

## 2013-04-08 MED ORDER — LIDOCAINE HCL (CARDIAC) 20 MG/ML IV SOLN
INTRAVENOUS | Status: DC | PRN
  Administered 2013-04-08: 100 mg via INTRAVENOUS

## 2013-04-08 MED ORDER — OXYCODONE HCL 5 MG/5ML PO SOLN: 5.0000 mg | Freq: Once | ORAL | Status: AC | PRN

## 2013-04-08 MED ORDER — HYDROMORPHONE HCL PF 1 MG/ML IJ SOLN
0.2500 mg | INTRAMUSCULAR | Status: DC | PRN
  Administered 2013-04-08 (×2): 0.5 mg via INTRAVENOUS

## 2013-04-08 MED ORDER — HYDROMORPHONE HCL PF 1 MG/ML IJ SOLN
1.0000 mg | INTRAMUSCULAR | Status: DC | PRN
  Administered 2013-04-08 (×2): 0.5 mg via INTRAVENOUS

## 2013-04-08 MED ORDER — ONDANSETRON HCL 4 MG/2ML IJ SOLN: 4.0000 mg | Freq: Four times a day (QID) | INTRAMUSCULAR | Status: DC | PRN

## 2013-04-08 MED ORDER — CEFAZOLIN SODIUM-DEXTROSE 2-3 GM-% IV SOLR
2.0000 g | INTRAVENOUS | Status: AC
  Administered 2013-04-08: 2 g via INTRAVENOUS

## 2013-04-08 MED ORDER — EPHEDRINE SULFATE 50 MG/ML IJ SOLN
INTRAMUSCULAR | Status: DC | PRN
  Administered 2013-04-08: 10 mg via INTRAVENOUS

## 2013-04-08 MED ORDER — LACTATED RINGERS IV SOLN
INTRAVENOUS | Status: DC
  Administered 2013-04-08 (×3): via INTRAVENOUS

## 2013-04-08 MED ORDER — OXYCODONE HCL 5 MG PO TABS: 5.0000 mg | ORAL_TABLET | Freq: Once | ORAL | Status: AC | PRN

## 2013-04-08 MED ORDER — DEXAMETHASONE SODIUM PHOSPHATE 4 MG/ML IJ SOLN
INTRAMUSCULAR | Status: DC | PRN
  Administered 2013-04-08: 10 mg via INTRAVENOUS

## 2013-04-08 MED ORDER — ONDANSETRON HCL 4 MG/2ML IJ SOLN: 4.0000 mg | Freq: Once | INTRAMUSCULAR | Status: AC | PRN

## 2013-04-08 MED ORDER — PROPOFOL 10 MG/ML IV BOLUS
INTRAVENOUS | Status: DC | PRN
  Administered 2013-04-08: 200 mg via INTRAVENOUS

## 2013-04-08 MED ORDER — ZOLPIDEM TARTRATE 5 MG PO TABS: 5.0000 mg | ORAL_TABLET | Freq: Every evening | ORAL | Status: DC | PRN

## 2013-04-08 MED ORDER — TECHNETIUM TC 99M SULFUR COLLOID FILTERED
1.0000 | Freq: Once | INTRAVENOUS | Status: AC | PRN
  Administered 2013-04-08: 1 via INTRADERMAL

## 2013-04-08 MED ORDER — KCL IN DEXTROSE-NACL 30-5-0.45 MEQ/L-%-% IV SOLN
INTRAVENOUS | Status: DC
  Administered 2013-04-08: 15:00:00 via INTRAVENOUS

## 2013-04-08 MED ORDER — ONDANSETRON HCL 4 MG/2ML IJ SOLN
INTRAMUSCULAR | Status: DC | PRN
  Administered 2013-04-08: 4 mg via INTRAVENOUS

## 2013-04-08 SURGICAL SUPPLY — 67 items
APL SKNCLS STERI-STRIP NONHPOA (GAUZE/BANDAGES/DRESSINGS) ×2
APPLIER CLIP 11 MED OPEN (CLIP)
APPLIER CLIP 9.375 MED OPEN (MISCELLANEOUS) ×3
APR CLP MED 11 20 MLT OPN (CLIP)
APR CLP MED 9.3 20 MLT OPN (MISCELLANEOUS) ×2
BENZOIN TINCTURE PRP APPL 2/3 (GAUZE/BANDAGES/DRESSINGS) ×3 IMPLANT
BINDER BREAST XLRG (GAUZE/BANDAGES/DRESSINGS) ×1 IMPLANT
BLADE HEX COATED 2.75 (ELECTRODE) ×3 IMPLANT
BLADE SURG 10 STRL SS (BLADE) ×3 IMPLANT
BLADE SURG 15 STRL LF DISP TIS (BLADE) ×2 IMPLANT
BLADE SURG 15 STRL SS (BLADE) ×6
BLADE SURG ROTATE 9660 (MISCELLANEOUS) IMPLANT
CANISTER SUCTION 1200CC (MISCELLANEOUS) ×3 IMPLANT
CHLORAPREP W/TINT 26ML (MISCELLANEOUS) ×3 IMPLANT
CLIP APPLIE 11 MED OPEN (CLIP) IMPLANT
CLIP APPLIE 9.375 MED OPEN (MISCELLANEOUS) IMPLANT
CLIP TI WIDE RED SMALL 6 (CLIP) IMPLANT
CLOTH BEACON ORANGE TIMEOUT ST (SAFETY) ×3 IMPLANT
COVER MAYO STAND STRL (DRAPES) ×3 IMPLANT
COVER PROBE W GEL 5X96 (DRAPES) ×3 IMPLANT
COVER TABLE BACK 60X90 (DRAPES) ×3 IMPLANT
DECANTER SPIKE VIAL GLASS SM (MISCELLANEOUS) IMPLANT
DEVICE DUBIN W/COMP PLATE 8390 (MISCELLANEOUS) ×2 IMPLANT
DRAIN CHANNEL 19F RND (DRAIN) ×5 IMPLANT
DRAIN HEMOVAC 1/8 X 5 (WOUND CARE) IMPLANT
DRAPE LAPAROSCOPIC ABDOMINAL (DRAPES) ×3 IMPLANT
DRAPE UTILITY XL STRL (DRAPES) ×3 IMPLANT
ELECT REM PT RETURN 9FT ADLT (ELECTROSURGICAL) ×3
ELECTRODE REM PT RTRN 9FT ADLT (ELECTROSURGICAL) ×2 IMPLANT
EVACUATOR SILICONE 100CC (DRAIN) ×5 IMPLANT
GAUZE SPONGE 4X4 12PLY STRL LF (GAUZE/BANDAGES/DRESSINGS) IMPLANT
GLOVE BIO SURGEON STRL SZ7 (GLOVE) ×1 IMPLANT
GLOVE BIO SURGEON STRL SZ7.5 (GLOVE) ×1 IMPLANT
GLOVE BIOGEL PI IND STRL 6.5 (GLOVE) IMPLANT
GLOVE BIOGEL PI INDICATOR 6.5 (GLOVE) ×1
GLOVE ECLIPSE 6.5 STRL STRAW (GLOVE) ×1 IMPLANT
GLOVE SURG ORTHO 8.0 STRL STRW (GLOVE) ×3 IMPLANT
GOWN PREVENTION PLUS XLARGE (GOWN DISPOSABLE) ×4 IMPLANT
GOWN PREVENTION PLUS XXLARGE (GOWN DISPOSABLE) ×3 IMPLANT
NDL HYPO 25X1 1.5 SAFETY (NEEDLE) ×4 IMPLANT
NDL SAFETY ECLIPSE 18X1.5 (NEEDLE) ×2 IMPLANT
NEEDLE HYPO 18GX1.5 SHARP (NEEDLE) ×3
NEEDLE HYPO 25X1 1.5 SAFETY (NEEDLE) ×3 IMPLANT
NS IRRIG 1000ML POUR BTL (IV SOLUTION) ×3 IMPLANT
PACK BASIN DAY SURGERY FS (CUSTOM PROCEDURE TRAY) ×3 IMPLANT
PENCIL BUTTON HOLSTER BLD 10FT (ELECTRODE) ×3 IMPLANT
PIN SAFETY STERILE (MISCELLANEOUS) ×3 IMPLANT
SLEEVE SCD COMPRESS KNEE MED (MISCELLANEOUS) ×1 IMPLANT
SPONGE LAP 18X18 X RAY DECT (DISPOSABLE) ×4 IMPLANT
SPONGE LAP 4X18 X RAY DECT (DISPOSABLE) ×1 IMPLANT
STAPLER VISISTAT 35W (STAPLE) ×5 IMPLANT
STRIP CLOSURE SKIN 1/2X4 (GAUZE/BANDAGES/DRESSINGS) ×3 IMPLANT
SUT ETHILON 3 0 FSL (SUTURE) ×2 IMPLANT
SUT ETHILON 3 0 PS 1 (SUTURE) ×2 IMPLANT
SUT MNCRL AB 3-0 PS2 18 (SUTURE) IMPLANT
SUT MNCRL AB 4-0 PS2 18 (SUTURE) IMPLANT
SUT SILK 2 0 SH (SUTURE) IMPLANT
SUT VIC AB 2-0 SH 27 (SUTURE)
SUT VIC AB 2-0 SH 27XBRD (SUTURE) IMPLANT
SUT VIC AB 3-0 SH 27 (SUTURE)
SUT VIC AB 3-0 SH 27X BRD (SUTURE) ×2 IMPLANT
SUT VICRYL 3-0 CR8 SH (SUTURE) ×3 IMPLANT
SYR CONTROL 10ML LL (SYRINGE) ×6 IMPLANT
TOWEL OR 17X24 6PK STRL BLUE (TOWEL DISPOSABLE) ×6 IMPLANT
TOWEL OR NON WOVEN STRL DISP B (DISPOSABLE) ×3 IMPLANT
TUBE CONNECTING 20X1/4 (TUBING) ×3 IMPLANT
YANKAUER SUCT BULB TIP NO VENT (SUCTIONS) ×3 IMPLANT

## 2013-04-08 NOTE — Anesthesia Procedure Notes (Signed)
Procedure Name: LMA Insertion Date/Time: 04/08/2013 10:20 AM Performed by: Meyer Russel Pre-anesthesia Checklist: Patient identified, Emergency Drugs available, Suction available and Patient being monitored Patient Re-evaluated:Patient Re-evaluated prior to inductionOxygen Delivery Method: Circle System Utilized Preoxygenation: Pre-oxygenation with 100% oxygen Intubation Type: IV induction Ventilation: Mask ventilation without difficulty LMA: LMA inserted LMA Size: 4.0 Number of attempts: 1 Airway Equipment and Method: bite block Placement Confirmation: positive ETCO2 and breath sounds checked- equal and bilateral Tube secured with: Tape Dental Injury: Teeth and Oropharynx as per pre-operative assessment

## 2013-04-08 NOTE — Brief Op Note (Signed)
04/08/2013  12:41 PM  PATIENT:  [name]  67 y.o. female  PRE-OPERATIVE DIAGNOSIS:  left breast cancer   POST-OPERATIVE DIAGNOSIS:  left breast cancer   PROCEDURE:  1.  Right mastectomy.  2.  Left mastectomy  3. Left axillary sentinel lymph node biopsy  4. Left axillary lymph node dissection  SURGEON:  Surgeon(s) and Role:    * Velora Heckler, MD - Primary  ASSISTANTS: Chevis Pretty, MD   ANESTHESIA:   general  EBL:  Total I/O In: 2000 [I.V.:2000] Out: -   BLOOD ADMINISTERED:none  DRAINS: (3) Blake drain(s) in the subcutaneous space bilaterally   LOCAL MEDICATIONS USED:  NONE  SPECIMEN:  Excision  DISPOSITION OF SPECIMEN:  PATHOLOGY  COUNTS:  YES  TOURNIQUET:  * No tourniquets in log *  DICTATION: .Other Dictation: Dictation Number [id number]  PLAN OF CARE: Admit for overnight observation  PATIENT DISPOSITION:  PACU - hemodynamically stable.   Delay start of Pharmacological VTE agent (>24hrs) due to surgical blood loss or risk of bleeding: yes  Velora Heckler, MD, [facility] Office: [phone]

## 2013-04-08 NOTE — H&P (Signed)
[name] is an 66 y.o. female.    General Surgery - [facility]  Chief Complaint: left breast carcinoma, multicentric; right breast atypical ductal hyperplasia  HPI: Patient is a 66-year-old female known to my practice from previous right breast excisional biopsy. Last year she underwent right breast excisional biopsy showing atypical ductal hyperplasia with calcifications. No malignancy was identified.   On routine self-examination approximately 10 days ago the patient noted a mass in the left breast. She presented to her gynecologist office and was referred for diagnostic studies. Mammogram was performed. Core needle biopsy was obtained on 2 lesions in the left breast, the first at the 12:00 position and the second at the 2:00 position. 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  Patient underwent bilateral breast MRI scan. Results show the disease in the left breast with likely additional disease extending towards the nipple areolar complex. There is also a area of suspicion in the right breast which may represent fat necrosis from her prior surgical procedure. Biopsy of the area in the right breast was performed and confirmed fat necrosis.  After extensive discussion at Breast Conference and with the patient, she now comes to surgery for bilateral mastectomy and left sentinel lymph node biopsy.  Patient was evaluated by plastic surgery and has opted to delay any reconstruction at this time.   Past Medical History  Diagnosis Date  . Hyperlipidemia   . Microcalcifications of the breast     atypical ductal hyperplasia with calcifications  . Breast lobular neoplasia     atypical  . Fibrocystic breast changes   . Osteopenia     Dr Fernandez  . PMB (postmenopausal bleeding)     Dr Fernandez  . Atrophic vaginitis   . Seasonal allergies   . Arthritis     osteoarthritis   . Wears glasses   . Heart murmur     grade 1  per patient  -echo 7/14    Past Surgical History  Procedure Laterality Date  . Endometrial biopsy  2002    Uterine  polyps, Dr. Gottsegen  . Colonoscopy  1986, 2003,2013    polyps 2013 only, Dr. Stark  . Cesarean section  1976  . Dilation and curettage of uterus    . Hysteroscopy    . Breast biopsy  01/09/2012    Procedure: BREAST BIOPSY WITH NEEDLE LOCALIZATION;  Surgeon: Camron Essman M Landra Howze, MD;  Location: [facility];  Service: General;  Laterality: Right;  . Abdominal hysterectomy N/A 03/02/2013    Procedure: TOTAL HYSTERECTOMY ABDOMINAL;  Surgeon: Paola A. Gehrig, MD;  Location: [facility];  Service: Gynecology;  Laterality: N/A;  . Salpingoophorectomy Bilateral 03/02/2013    Procedure: BILATERAL SALPINGO OOPHORECTOMY;  Surgeon: Paola A. Gehrig, MD;  Location: [facility];  Service: Gynecology;  Laterality: Bilateral;    Family History  Problem Relation Age of Onset  . Alcohol abuse Mother   . Cancer Father     bone marrow cancer  . Alcohol abuse Father   . Von Willebrand disease Father   . Depression Son     Recovering alcoholic  . Depression Maternal Aunt   . Prostate cancer Maternal Uncle   . Diabetes Paternal Uncle   . Heart attack Paternal Uncle 51  . Cancer Paternal Grandmother     skin cancer  . Heart failure Paternal Grandmother     in 90s  . Colon cancer Neg Hx   . Stomach cancer Neg Hx   .   Stroke Neg Hx   . GER disease Paternal Grandmother     esophageal stricture  . GER disease Son    Social History:  reports that she has never smoked. She has never used smokeless tobacco. She reports that she drinks about 1.2 ounces of alcohol per week. She reports that she does not use illicit drugs.  Allergies:  Allergies  Allergen Reactions  . Codeine Rash    Post C section  . Oxycodone Itching    Medications Prior to Admission  Medication Sig Dispense Refill  . Calcium 250 MG CAPS Take 1 capsule by mouth daily.      . cholecalciferol (VITAMIN D) 1000 UNITS tablet Take  1,000 Units by mouth daily.      . fluticasone (FLONASE) 50 MCG/ACT nasal spray Place 2 sprays into the nose daily as needed for rhinitis or allergies.      . GLUCOSAMINE SULFATE PO Take 1 tablet by mouth daily.      . loratadine (CLARITIN) 10 MG tablet Take 10 mg by mouth daily.      . Magnesium 500 MG TABS Take 500 mg by mouth daily.      . Multiple Vitamin (MULTIVITAMIN) tablet Take 1 tablet by mouth daily.        . Omega-3 Fatty Acids (FISH OIL) 1000 MG CAPS Take by mouth daily.        . Polyethyl Glycol-Propyl Glycol (SYSTANE) 0.4-0.3 % SOLN Apply to eye. 2-3 x daily       . Vitamin D, Ergocalciferol, (DRISDOL) 50000 UNITS CAPS Take 50,000 Units by mouth every 14 (fourteen) days.      . zolpidem (AMBIEN) 5 MG tablet Take 1 tablet (5 mg total) by mouth at bedtime as needed for sleep.  10 tablet  0  . NONFORMULARY OR COMPOUNDED ITEM Estradiol .02% 1 ML Prefilled Applicator Sig: apply vaginally twice a week #90 Day Supply with 4 refills  1 each  4  . traMADol (ULTRAM) 50 MG tablet Take 1 tablet (50 mg total) by mouth every 6 (six) hours as needed for pain.  10 tablet  0  . VALERIAN ROOT PO Take by mouth at bedtime as needed.        Results for orders placed during the hospital encounter of 04/08/13 (from the past 48 hour(s))  POCT HEMOGLOBIN-HEMACUE     Status: None   Collection Time    04/08/13  9:22 AM      Result Value Range   Hemoglobin 13.9  12.0 - 15.0 g/dL   No results found.  Review of Systems  Constitutional: Negative.   HENT: Negative.   Eyes: Negative.   Respiratory: Negative.   Cardiovascular: Negative.   Gastrointestinal: Negative.   Genitourinary: Negative.   Musculoskeletal: Negative.   Skin: Negative.   Neurological: Negative.   Endo/Heme/Allergies: Negative.   Psychiatric/Behavioral: Negative.     Blood pressure 122/88, pulse 77, temperature 97.7 F (36.5 C), temperature source Oral, resp. rate 18, height 5' 4.5" (1.638 m), weight 192 lb 8 oz (87.317 kg),  SpO2 97.00%. Physical Exam  Constitutional: She is oriented to person, place, and time. She appears well-developed and well-nourished. No distress.  HENT:  Head: Normocephalic and atraumatic.  Right Ear: External ear normal.  Left Ear: External ear normal.  Mouth/Throat: Oropharynx is clear and moist.  Eyes: Conjunctivae are normal. Pupils are equal, round, and reactive to light. Left eye exhibits no discharge. No scleral icterus.  Neck: Normal range of motion. Neck supple.   No thyromegaly present.  Cardiovascular: Normal rate, regular rhythm and normal heart sounds.   No murmur heard. Respiratory: Effort normal and breath sounds normal. She has no wheezes.  Right breast with well healed incision upper breast.  Left breast with palpable mass upper breast.  Biopsy sites well healed.  No axillary lymphadenopathy.  GI: Soft. Bowel sounds are normal. She exhibits no distension.  Musculoskeletal: Normal range of motion. She exhibits no edema.  Lymphadenopathy:    She has no cervical adenopathy.  Neurological: She is alert and oriented to person, place, and time.  Skin: Skin is warm and dry.  Psychiatric: She has a normal mood and affect. Her behavior is normal.     Assessment/Plan Left breast carcinoma, multifocal  Right breast atypical ductal hyperplasia  Plan bilateral mastectomy with left sentinel lymph node biopsy  The risks and benefits of the procedure have been discussed at length with the patient.  The patient understands the proposed procedure, potential alternative treatments, and the course of recovery to be expected.  All of the patient's questions have been answered at this time.  The patient wishes to proceed with surgery.  Meric Joye M. Copper Kirtley, MD, FACS [facility] Office: [phone]    Anandi Abramo M 04/08/2013, 9:57 AM    

## 2013-04-08 NOTE — Anesthesia Preprocedure Evaluation (Signed)

## 2013-04-08 NOTE — Anesthesia Postprocedure Evaluation (Signed)
  Anesthesia Post-op Note  Patient: [name]  Procedure(s) Performed: Procedure(s) with comments: TOTAL MASTECTOMY (Bilateral) - needs bed for over night stay AXILLARY LYMPH NODE BIOPSY and axillary lymph node disection (Left) - nuclear medicine injection   Patient Location: PACU  Anesthesia Type:General  Level of Consciousness: awake, alert  and oriented  Airway and Oxygen Therapy: Patient Spontanous Breathing and Patient connected to face mask oxygen  Post-op Pain: mild  Post-op Assessment: Post-op Vital signs reviewed  Post-op Vital Signs: Reviewed  Complications: No apparent anesthesia complications

## 2013-04-08 NOTE — Transfer of Care (Signed)
Immediate Anesthesia Transfer of Care Note  Patient: [name]  Procedure(s) Performed: Procedure(s) with comments: TOTAL MASTECTOMY (Bilateral) - needs bed for over night stay AXILLARY LYMPH NODE BIOPSY (Left) - nuclear medicine injection   Patient Location: PACU  Anesthesia Type:General  Level of Consciousness: awake, sedated and patient cooperative  Airway & Oxygen Therapy: Patient Spontanous Breathing and Patient connected to face mask oxygen  Post-op Assessment: Report given to PACU RN, Post -op Vital signs reviewed and stable and Patient moving all extremities  Post vital signs: Reviewed and stable  Complications: No apparent anesthesia complications

## 2013-04-09 ENCOUNTER — Encounter (HOSPITAL_BASED_OUTPATIENT_CLINIC_OR_DEPARTMENT_OTHER): Payer: Self-pay | Admitting: Surgery

## 2013-04-09 MED ORDER — HYDROCODONE-ACETAMINOPHEN 5-325 MG PO TABS: 1.0000 | ORAL_TABLET | ORAL | Status: DC | PRN

## 2013-04-09 NOTE — Op Note (Signed)
[name], [name]                 ACCOUNT NO.:  [id number]  MEDICAL RECORD NO.:  [id number]  LOCATION:  [facility]                          FACILITY:  [facility]  PHYSICIAN:  Velora Heckler, MD      DATE OF BIRTH:  Aug 05, 1946  DATE OF PROCEDURE:  04/08/2013                               OPERATIVE REPORT   PREOPERATIVE DIAGNOSIS:  Left breast carcinoma, multicentric.  POSTOPERATIVE DIAGNOSIS:  Left breast carcinoma, multicentric.  PROCEDURES: 1. Right mastectomy. 2. Left mastectomy. 3. Left axillary sentinel lymph node biopsy. 4. Left axillary lymph node dissection.  SURGEON:  Velora Heckler, MD, FACS  ASSISTANT:  Ollen Gross. Vernell Morgans, M.D.   ANESTHESIA:  General per Dr. Sheldon Silvan  ESTIMATED BLOOD LOSS:  Minimal.  PREPARATION:  ChloraPrep.  COMPLICATIONS:  None.  INDICATIONS:  The patient is a 67 year old female with a previous history of right breast excisional biopsy showing atypical ductal hyperplasia with calcifications.  The patient noted a mass in the left breast and in June 2014.  She presented to her gynecologist and was referred for diagnostic studies.  Mammogram was performed.  The patient underwent biopsy of 2 lesions in the left breast, one at the 12 o'clock position and the second at the 2 o'clock position.  Pathology shows invasive carcinoma and carcinoma in situ at both biopsy sites.  The patient underwent bilateral breast MRI scan.  She had an additional biopsy performed on the right breast showing an area of fat necrosis. The patient now comes to surgery for bilateral mastectomy and sentinel lymph node biopsy of the left axilla.  DESCRIPTION OF PROCEDURE:  The procedure was done in OR #8 at the [facility].  The patient was brought to the operating, room, placed in supine position on the operating room table.  Following administration of general anesthesia, the patient was prepped and draped in the usual aseptic fashion.  After ascertaining that an  adequate level of anesthesia had been achieved, an elliptical incision was made on the right breast so as to encompass the entire nipple-areolar complex. Incisions were extended into the subcutaneous tissue using the electrocautery for hemostasis.  Using breast hooks, skin flaps were developed circumferentially in the usual fashion with the margins of dissection being the clavicle superiorly, the sternum medially, the rectus sheath inferiorly, and the latissimus dorsi muscle laterally. The breast was then reflected off the chest wall including the fascia. Hemostasis was achieved with electrocautery.  The entire right breast was excised.  Sutures were used to orient the breast, and the entire breast was submitted to Pathology for review.  Surgical wound was irrigated with warm saline, and good hemostasis was achieved.  A 19- [place] Blake drain was brought in through an inferior stab wound and secured to the skin with a 3-0 nylon suture.  Drain was left in the subcutaneous space.  Subcutaneous tissues were closed with interrupted 3- 0 Vicryl simple sutures.  Skin was closed with stainless steel staples. Drain was placed to bulb suction.  The right breast was submitted to Pathology for review.  Next, we turned our attention to the left breast.  Again, an elliptical incision was  made so as to encompass the entire nipple-areolar complex. The superior flap was raised from the sternum to the clavicle to the latissimus muscle laterally.  The patient had been injected preoperatively with sestamibi.  Using the Neoprobe, a sentinel lymph node was identified in the high axilla.  This was gently dissected out. Hemostasis was achieved with electrocautery and with the use of medium Ligaclips.  Three separate sentinel lymph nodes were identified with the Neoprobe, and all 3 nodes were excised and submitted to Pathology for review.  Dr. Brain Hilts returned our call with a positive sentinel lymph node  #1 and negative sentinel lymph nodes #2 and #3.  Inferior flaps were then raised from the sternum to the rectus sheath to the latissimus muscle laterally.  The breast was reflected off the chest wall including the underlying muscle fascia.  Breast was completely excised.  Again, it was oriented with sutures and submitted in its entirety to Pathology for review.  Left axillary lymph node dissection was then performed.  Venous tributaries were divided between Ligaclips.  The long thoracic and thoracodorsal vessels and nerves were dissected out and preserved.  The axillary content was dissected free using the electrocautery for hemostasis.  Axillary content was submitted to Pathology for permanent review.  Wound was irrigated with warm saline which was evacuated.  Good hemostasis was noted.  Two 19-French Blake drains were brought in from inferior stab wounds and placed in the subcutaneous space.  They were secured to the skin with 3-0 nylon sutures.  Subcutaneous tissues were reapproximated with interrupted 3-0 Vicryl simple sutures.  Skin was closed with stainless steel staples.  Drains were placed to bulb suction.  Dressings were applied.  A breast binder was applied.  The patient was awakened from anesthesia and brought to the recovery room. The patient tolerated the procedure well.   Velora Heckler, MD, [facility] Office: [phone]    TMG/MEDQ  D:  04/08/2013  T:  04/09/2013  Job:  [id number]  cc:   Marca Ancona, MD

## 2013-04-09 NOTE — Discharge Summary (Signed)
Physician Discharge Summary [facility]  Patient ID: [name] MRN: [id number] DOB/AGE: 67/06/1946 67 y.o.  Admit date: 04/08/2013 Discharge date: 04/09/2013  Admission Diagnoses:  Breast cancer  Discharge Diagnoses:  Principal Problem:   Breast cancer of upper-outer quadrant of left female breast, multifocal Active Problems:   Atypical ductal hyperplasia of breast, right   Discharged Condition: good  Hospital Course: patient admitted for observation after bilateral mastectomy.  Post op course uncomplicated.  Pain controlled.  Instructed in drain care.  Prepared for discharge POD#1.  Consults: None  Significant Diagnostic Studies: none  Treatments: surgery: bilateral mastectomy with axillary lymph node dissection  Discharge Exam: Blood pressure 96/64, pulse 51, temperature 98.2 F (36.8 C), temperature source Oral, resp. rate 16, height 5' 4.5" (1.638 m), weight 192 lb 8 oz (87.317 kg), SpO2 99.00%. HEENT- clear Cor - RRR Chest - dressing dry and intact; serosanguinous from drains  Disposition: Home with family  Discharge Orders   Future Appointments Provider Department Dept Phone   04/14/2013 10:30 AM Velora Heckler, MD [facility], [place] [phone]   04/14/2013 2:45 PM Paola A. Duard Brady, MD [facility] [phone]   04/29/2013 12:00 PM Krista Blue [facility] MEDICAL ONCOLOGY [phone]   04/29/2013 12:30 PM Victorino December, MD  [facility] [phone]   Future Orders Complete By Expires     Diet - low sodium heart healthy  As directed     Discharge instructions  As directed     Comments:      [facility] -- DISCHARGE INSTRUCTIONS  REMINDER:   Carry a list of your medications and allergies with you at all times  Call your pharmacy at least 1 week in advance to refill prescriptions  Do not mix any prescribed pain medicine with  alcohol  Do not drive any motor vehicles while taking pain medication  Take medications with food unless otherwise directed  Follow-up appointments (date to return to physician): Please call [phone] to confirm your follow up appointment with your surgeon.  Call your Surgeon if you have:  Temperature greater than 101.0  Persistent nausea and vomiting  Severe uncontrolled pain  Redness, tenderness, or signs of infection (pain, swelling, redness, odor or    green/yellow discharge around the site)  Difficulty breathing, headache or visual disturbances  Hives  Persistent dizziness or light-headedness  Any other questions or concerns you may have after discharge  In an emergency, call 911 or go to an Emergency Department at a nearby hospital.   Diet: Begin with liquids, and if they are tolerated, resume your usual diet.  Avoid spicy, greasy or heavy foods.  If you have nausea or vomiting, go back to liquids.  If you cannot keep liquids down, call your doctor.  Avoid alcohol consumption while on prescription pain medications. Good nutrition promotes healing. Increase fiber and fluids.   ADDITIONAL INSTRUCTIONS: Drain care as instructed.  Velora Heckler, MD, [facility] Office: [phone]    Increase activity slowly  As directed     Leave dressing on - Keep it clean, dry, and intact until clinic visit  As directed         Medication List         Calcium 250 MG Caps  Take 1 capsule by mouth daily.     cholecalciferol 1000 UNITS tablet  Commonly known as:  VITAMIN D  Take 1,000  Units by mouth daily.     Fish Oil 1000 MG Caps  Take by mouth daily.     fluticasone 50 MCG/ACT nasal spray  Commonly known as:  FLONASE  Place 2 sprays into the nose daily as needed for rhinitis or allergies.     GLUCOSAMINE SULFATE PO  Take 1 tablet by mouth daily.     HYDROcodone-acetaminophen 5-325 MG per tablet  Commonly known as:  NORCO/VICODIN  Take 1-2  tablets by mouth every 4 (four) hours as needed for pain.     loratadine 10 MG tablet  Commonly known as:  CLARITIN  Take 10 mg by mouth daily.     Magnesium 500 MG Tabs  Take 500 mg by mouth daily.     multivitamin tablet  Take 1 tablet by mouth daily.     NONFORMULARY OR COMPOUNDED ITEM  - Estradiol .02%  - 1 ML Prefilled Applicator  - Sig: apply vaginally twice a week  - #90 Day Supply with 4 refills     SYSTANE 0.4-0.3 % Soln  Generic drug:  Polyethyl Glycol-Propyl Glycol  Apply to eye. 2-3 x daily     traMADol 50 MG tablet  Commonly known as:  ULTRAM  Take 1 tablet (50 mg total) by mouth every 6 (six) hours as needed for pain.     VALERIAN ROOT PO  Take by mouth at bedtime as needed.     Vitamin D (Ergocalciferol) 50000 UNITS Caps  Commonly known as:  DRISDOL  Take 50,000 Units by mouth every 14 (fourteen) days.     zolpidem 5 MG tablet  Commonly known as:  AMBIEN  Take 1 tablet (5 mg total) by mouth at bedtime as needed for sleep.           Follow-up Information   Follow up with Velora Heckler, MD. Schedule an appointment as soon as possible for a visit in 5 days.   Contact information:   [address] [place] [address] [phone]       Velora Heckler, MD, [facility] Office: [phone]   Signed: Velora Heckler 04/09/2013, 7:18 AM

## 2013-04-14 ENCOUNTER — Encounter (INDEPENDENT_AMBULATORY_CARE_PROVIDER_SITE_OTHER): Payer: Self-pay | Admitting: Surgery

## 2013-04-14 ENCOUNTER — Ambulatory Visit (INDEPENDENT_AMBULATORY_CARE_PROVIDER_SITE_OTHER): Payer: Medicare Other | Admitting: Surgery

## 2013-04-14 ENCOUNTER — Ambulatory Visit: Payer: Medicare Other | Attending: Gynecologic Oncology | Admitting: Gynecologic Oncology

## 2013-04-14 ENCOUNTER — Encounter: Payer: Self-pay | Admitting: Gynecologic Oncology

## 2013-04-14 VITALS — BP 132/70 | HR 76 | Temp 98.6°F | Resp 15 | Ht 64.5 in | Wt 192.8 lb

## 2013-04-14 VITALS — BP 124/80 | HR 68 | Temp 97.6°F | Resp 18

## 2013-04-14 DIAGNOSIS — N6099 Unspecified benign mammary dysplasia of unspecified breast: Secondary | ICD-10-CM

## 2013-04-14 DIAGNOSIS — R1900 Intra-abdominal and pelvic swelling, mass and lump, unspecified site: Secondary | ICD-10-CM

## 2013-04-14 DIAGNOSIS — C50419 Malignant neoplasm of upper-outer quadrant of unspecified female breast: Secondary | ICD-10-CM

## 2013-04-14 DIAGNOSIS — N6089 Other benign mammary dysplasias of unspecified breast: Secondary | ICD-10-CM

## 2013-04-14 DIAGNOSIS — C50412 Malignant neoplasm of upper-outer quadrant of left female breast: Secondary | ICD-10-CM

## 2013-04-14 DIAGNOSIS — D279 Benign neoplasm of unspecified ovary: Secondary | ICD-10-CM

## 2013-04-14 NOTE — Patient Instructions (Signed)
Doing well.  Plan to follow up as needed.  Please call for any questions or concerns.

## 2013-04-14 NOTE — Progress Notes (Signed)
Follow Up Note: Gyn-Onc  [name] 67 y.o. female  CC:  Chief Complaint  Patient presents with  . Ovarian Fibroma    Follow up visit     HPI:  [name] is a 67 year old who was referred by Dr. Lily Peer for a solid right adnexal mass.  She had a normal OVA 1 and her CT scan showed no evidence of metastatic disease.  On March 02, 2013, she underwent a total abdominal hysterectomy, bilateral salpingo-oophorectomy by Dr. Cleda Mccreedy.  Her post-operative course was uneventful.  Final pathology revealed 1. 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 - BENIGN FALLOPIAN TUBAL TISSUE, NO EVIDENCE OF ENDOMETRIOSIS, ATYPIA OR MALIGNANCY. 2. Uterus and cervix, with left tube and ovary - LEIOMYOMATA AND ADENOMYOSIS. - ENDOMETRIUM: BENIGN ENDOMETRIAL POLYP AND ADJACENT BENIGN PROLIFERATIVE ENDOMETRIUM, NO ATYPIA, HYPERPLASIA OR MALIGNANCY. - CERVIX: BENIGN SQUAMOUS MUCOSA AND ENDOCERVICAL MUCOSA, NO DYSPLASIA OR MALIGNANCY. - LEFT OVARY : OVARIAN FIBROMA, 3.2 CM, NO ATYPIA OR MALIGNANCY. - FALLOPIAN TUBE: BENIGN FALLOPIAN TUBAL TISSUE, NO EVIDENCE OF ENDOMETRIOSIS, ATYPIA OR MALIGNANCY.  Interval History:  She presents today for post-operative follow up.  She is s/p bilateral mastectomies with left axillary lymph node dissection and sentinel lymph node biopsy.  She reports mild soreness with JP draining minimal amounts from the mastectomy sites.  She describes expected post operative status.  Adequate PO intake reported.  Bowels and bladder functioning without difficulty.  Pain minimal.  No vaginal bleeding or drainage.  No erythema or drainage from the incision reported.  No concerns voiced.    Review of Systems  Constitutional: Feels well.  No fever, chills, change in weight or appetite.  Cardiovascular: No chest pain, shortness of breath, or edema.  Pulmonary: No cough or  wheeze.  Gastrointestinal: No nausea, vomiting, or diarrhea. No bright red blood per rectum or change in bowel movement.  Genitourinary: No frequency, urgency, or dysuria. No vaginal bleeding or discharge.  Musculoskeletal: No myalgia or joint pain. Neurologic: No weakness, numbness, or change in gait.  Psychology: No depression or anxiety.  Intermittent insomnia.    Current Meds:  Outpatient Encounter Prescriptions as of 04/14/2013  Medication Sig Dispense Refill  . Calcium 250 MG CAPS Take 1 capsule by mouth daily. 500 mg Calcium tab      . cholecalciferol (VITAMIN D) 1000 UNITS tablet Take 1,000 Units by mouth daily.      . fluticasone (FLONASE) 50 MCG/ACT nasal spray Place 2 sprays into the nose daily as needed for rhinitis or allergies.      Marland Kitchen HYDROcodone-acetaminophen (NORCO/VICODIN) 5-325 MG per tablet Take 1-2 tablets by mouth every 4 (four) hours as needed for pain.  30 tablet  1  . loratadine (CLARITIN) 10 MG tablet Take 10 mg by mouth daily.      . Magnesium 500 MG TABS Take 250 mg by mouth 2 (two) times daily.       . Multiple Vitamin (MULTIVITAMIN) tablet Take 1 tablet by mouth daily.        . Omega-3 Fatty Acids (FISH OIL) 1000 MG CAPS Take by mouth daily.        Bertram Gala Glycol-Propyl Glycol (SYSTANE) 0.4-0.3 % SOLN Apply to eye. 2-3 x daily       . Vitamin D, Ergocalciferol, (DRISDOL) 50000 UNITS CAPS Take 50,000 Units by mouth every 14 (fourteen) days.      Marland Kitchen GLUCOSAMINE SULFATE PO Take 1 tablet by mouth daily.      Marland Kitchen  NONFORMULARY OR COMPOUNDED ITEM Estradiol .02% 1 ML Prefilled Applicator Sig: apply vaginally twice a week #90 Day Supply with 4 refills  1 each  4  . traMADol (ULTRAM) 50 MG tablet Take 1 tablet (50 mg total) by mouth every 6 (six) hours as needed for pain.  10 tablet  0  . VALERIAN ROOT PO Take by mouth at bedtime as needed.      . zolpidem (AMBIEN) 5 MG tablet Take 1 tablet (5 mg total) by mouth at bedtime as needed for sleep.  10 tablet  0   No  facility-administered encounter medications on file as of 04/14/2013.    Allergy:  Allergies  Allergen Reactions  . Codeine Rash    Post C section  . Oxycodone Itching    Social Hx:   History   Social History  . Marital Status: Divorced    Spouse Name: N/A    Number of Children: N/A  . Years of Education: N/A   Occupational History  . Not on file.   Social History Main Topics  . Smoking status: Never Smoker   . Smokeless tobacco: Never Used     Comment: second hand smoke for decades  . Alcohol Use: 1.2 oz/week    2 Glasses of wine per week     Comment: socially 1 - 2 glasses of wine per week  . Drug Use: No  . Sexually Active: No   Other Topics Concern  . Not on file   Social History Narrative  . No narrative on file    Past Surgical Hx:  Past Surgical History  Procedure Laterality Date  . Endometrial biopsy  2002    Uterine  polyps, Dr. Eda Paschal  . Colonoscopy  [id number], [id number]    polyps 2013 only, Dr. Russella Dar  . Cesarean section  1976  . Dilation and curettage of uterus    . Hysteroscopy    . Breast biopsy  01/09/2012    Procedure: BREAST BIOPSY WITH NEEDLE LOCALIZATION;  Surgeon: Velora Heckler, MD;  Location: [facility];  Service: General;  Laterality: Right;  . Abdominal hysterectomy N/A 03/02/2013    Procedure: TOTAL HYSTERECTOMY ABDOMINAL;  Surgeon: Rejeana Brock A. Duard Brady, MD;  Location: [facility];  Service: Gynecology;  Laterality: N/A;  . Salpingoophorectomy Bilateral 03/02/2013    Procedure: BILATERAL SALPINGO OOPHORECTOMY;  Surgeon: Rejeana Brock A. Duard Brady, MD;  Location: [facility];  Service: Gynecology;  Laterality: Bilateral;  . Total mastectomy Bilateral 04/08/2013    Procedure: TOTAL MASTECTOMY;  Surgeon: Velora Heckler, MD;  Location: [facility];  Service: General;  Laterality: Bilateral;  needs bed for over night stay  . Axillary lymph node biopsy Left 04/08/2013    Procedure: AXILLARY LYMPH NODE BIOPSY and axillary lymph node disection;   Surgeon: Velora Heckler, MD;  Location: [facility];  Service: General;  Laterality: Left;  nuclear medicine injection     Past Medical Hx:  Past Medical History  Diagnosis Date  . Hyperlipidemia   . Microcalcifications of the breast     atypical ductal hyperplasia with calcifications  . Breast lobular neoplasia     atypical  . Fibrocystic breast changes   . Osteopenia     Dr Lily Peer  . PMB (postmenopausal bleeding)     Dr Lily Peer  . Atrophic vaginitis   . Seasonal allergies   . Arthritis     osteoarthritis   . Wears glasses   . Heart murmur  grade 1  per patient -echo 7/14    Family Hx:  Family History  Problem Relation Age of Onset  . Alcohol abuse Mother   . Cancer Father     bone marrow cancer  . Alcohol abuse Father   . Von Willebrand disease Father   . Depression Son     Recovering alcoholic  . Depression Maternal Aunt   . Prostate cancer Maternal Uncle   . Diabetes Paternal Uncle   . Heart attack Paternal Uncle 51  . Cancer Paternal Grandmother     skin cancer  . Heart failure Paternal Grandmother     in 90s  . Colon cancer Neg Hx   . Stomach cancer Neg Hx   . Stroke Neg Hx   . GER disease Paternal Grandmother     esophageal stricture  . GER disease Son     Vitals:  Blood pressure 124/80, pulse 68, temperature 97.6 F (36.4 C), temperature source Oral, resp. rate 18.  Physical Exam:  General: Well developed, well nourished female in no acute distress. Alert and oriented x 3.  Neck: Supple without any enlargements.  Lymph node survey: No cervical, supraclavicular, or inguinal adenopathy  Cardiovascular: Regular rate and rhythm. S1 and S2 normal.  Lungs: Clear to auscultation bilaterally. No wheezes/crackles/rhonchi noted.  Skin: No rashes or lesions present. Back: No CVA tenderness  Abdomen: Abdomen soft, non-tender and non-obese. Active bowel sounds in all quadrants. No evidence of a fluid wave or abdominal masses.  Midline  incision well healed with no erythema or drainage with no evidence of herniation.  Genitourinary:    Vulva/vagina: Normal external female genitalia. No lesions.    Urethra: No lesions or masses.    Vagina: Atrophic without any lesions. No palpable masses. No vaginal bleeding or drainage noted.   Extremities: No bilateral cyanosis, edema, or clubbing.   Assessment/Plan:  67 year old s/p total abdominal hysterectomy, bilateral salpingo-oophorectomy by Dr. Cleda Mccreedy on March 02, 2013.  Final pathology revealed 1. 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 - BENIGN FALLOPIAN TUBAL TISSUE, NO EVIDENCE OF ENDOMETRIOSIS, ATYPIA OR MALIGNANCY. 2. Uterus and cervix, with left tube and ovary - LEIOMYOMATA AND ADENOMYOSIS. - ENDOMETRIUM: BENIGN ENDOMETRIAL POLYP AND ADJACENT BENIGN PROLIFERATIVE ENDOMETRIUM, NO ATYPIA, HYPERPLASIA OR MALIGNANCY. - CERVIX: BENIGN SQUAMOUS MUCOSA AND ENDOCERVICAL MUCOSA, NO DYSPLASIA OR MALIGNANCY. - LEFT OVARY : OVARIAN FIBROMA, 3.2 CM, NO ATYPIA OR MALIGNANCY. - FALLOPIAN TUBE: BENIGN FALLOPIAN TUBAL TISSUE, NO EVIDENCE OF ENDOMETRIOSIS, ATYPIA OR MALIGNANCY.  She is advised to follow up with GYN Oncology on an as needed basis if issues arise in the future.  Instructed to call for any questions or concerns.  Reportable signs and symptoms reviewed.       CROSS, MELISSA DEAL, NP 04/14/2013, 5:09 PM

## 2013-04-14 NOTE — Patient Instructions (Signed)
May shower.  Drain care as instructed.  Velora Heckler, MD, [facility] Office: [phone]

## 2013-04-14 NOTE — Progress Notes (Signed)
General Surgery [facility]  Visit Diagnoses: 1. Breast cancer of upper-outer quadrant of left female breast, multifocal   2. Atypical ductal hyperplasia of breast, right     HISTORY: Patient is a 67 year old female who underwent bilateral mastectomy, sentinel lymph node biopsy, and left axillary lymph node dissection 6 days ago. Final pathology is now available. There were 2 discrete tumors in the left breast. Right breast was free of malignancy. Axillary lymph nodes showed 2 lymph nodes with metastatic carcinoma out of a total of 14 examined. Patient returns today for wound check and drain check.  EXAM: Surgical incisions are healing nicely. Skin flaps are entirely viable. No sign of infection. No sign of seroma. Drains had serosanguineous output which is moderate.  IMPRESSION: #1 left breast carcinoma #2 benign disease right breast #3 left axillary lymph nodes, 2 of 14 with metastatic carcinoma  PLAN: The patient will see medical oncology on August 14. We will coordinate placement of an infusion port prior to that date.  We will contact the patient in 2 days in regards to her drain output. She will return to the office for drain removal once the output from each drain is less than 30 cc in a 24-hour interval. Hopefully the drains will be completely removed within the next week.  Infusion port placement will be performed as an outpatient procedure. I discussed the procedure with the patient and her son in the office today. We discussed the risk and benefits. They understand and agree to proceed.  The risks and benefits of the procedure have been discussed at length with the patient.  The patient understands the proposed procedure, potential alternative treatments, and the course of recovery to be expected.  All of the patient's questions have been answered at this time.  The patient wishes to proceed with surgery.  Velora Heckler, MD, FACS General & Endocrine  Surgery [facility]

## 2013-04-16 ENCOUNTER — Telehealth (INDEPENDENT_AMBULATORY_CARE_PROVIDER_SITE_OTHER): Payer: Self-pay

## 2013-04-16 NOTE — Telephone Encounter (Signed)
Per Dr Ardine Eng request I phoned pt to ck on status of drains. Pt states she is doing well.  Drains still producing above 30cc in 24 hr. Pt will call Monday with update so we can remove drain if possible.

## 2013-04-19 ENCOUNTER — Ambulatory Visit (INDEPENDENT_AMBULATORY_CARE_PROVIDER_SITE_OTHER): Payer: Medicare Other

## 2013-04-19 ENCOUNTER — Encounter (INDEPENDENT_AMBULATORY_CARE_PROVIDER_SITE_OTHER): Payer: Self-pay

## 2013-04-19 VITALS — BP 122/84 | HR 71 | Temp 97.3°F | Resp 16 | Ht 64.5 in | Wt 194.2 lb

## 2013-04-19 DIAGNOSIS — Z4889 Encounter for other specified surgical aftercare: Secondary | ICD-10-CM

## 2013-04-19 DIAGNOSIS — Z4803 Encounter for change or removal of drains: Secondary | ICD-10-CM

## 2013-04-19 NOTE — Progress Notes (Signed)
Patient comes into office today for drain removal.  Patient s/p bilateral breast mastectomy on 04/08/13.  Patient called into office to report her drain output for drain #2 has been below 15-20 mL's over the last 24 hours.  Patient incisions appear to be healing well.  Suture removed for Drain #2, removed JP Drain #2. Patient tolerated well.  Dry gauze placed over drain site.  Patient has follow up appointment on 04/22/13 @ 3:20 pm w/Dr. Luisa Hart (Dr. Gerrit Friends not available).

## 2013-04-20 ENCOUNTER — Encounter: Payer: Self-pay | Admitting: *Deleted

## 2013-04-21 ENCOUNTER — Other Ambulatory Visit: Payer: Self-pay

## 2013-04-22 ENCOUNTER — Encounter (INDEPENDENT_AMBULATORY_CARE_PROVIDER_SITE_OTHER): Payer: Self-pay | Admitting: Surgery

## 2013-04-22 ENCOUNTER — Ambulatory Visit (INDEPENDENT_AMBULATORY_CARE_PROVIDER_SITE_OTHER): Payer: Medicare Other | Admitting: Surgery

## 2013-04-22 VITALS — BP 128/68 | HR 70 | Temp 98.2°F | Resp 16 | Ht 64.5 in | Wt 190.2 lb

## 2013-04-22 DIAGNOSIS — Z9889 Other specified postprocedural states: Secondary | ICD-10-CM

## 2013-04-22 NOTE — Progress Notes (Signed)
Patient returns 2 weeks after bilateral mastectomy for left breast cancer. She also underwent left axillary lymph node dissection.  She is doing well.  Exam: Both incisions clean dry and intact. Staples removed and Steri-Stripped. There are 2 remaining drains and these continue to drain more than 30 cc a day  1. Breast, simple mastectomy, Right - FIBROCYSTIC CHANGES WITH CALCIFICATIONS. - HEALING BIOPSY SITE WITH ASSOCIATED FAT NECROSIS. - THERE IS NO EVIDENCE OF MALIGNANCY. 2. Lymph node, sentinel, biopsy, Left axilla #1 - METASTATIC CARCINOMA IN 1 OF 1 LYMPH NODE (1/1) WITH EXTRACAPSULAR EXTENSION. 3. Lymph node, sentinel, biopsy, Left axilla #2 - THERE IS NO EVIDENCE OF CARCINOMA IN 1 OF 1 LYMPH NODE (0/1). 4. Lymph node, sentinel, biopsy, Left axilla #3 - THERE IS NO EVIDENCE OF CARCINOMA IN 1 OF 1 LYMPH NODE (0/1). 5. Breast, simple mastectomy, Left - INVASIVE DUCTAL CARCINOMA, GRADE II/III, TWO FOCI SPANNING 2.2 AND 1.7 CM. - DUCTAL CARCINOMA IN SITU WITH CALCIFICATIONS, INTERMEDIATE GRADE. - LYMPHOVASCULAR INVASION IS IDENTIFIED. - PERINEURAL INVASION IS IDENTIFIED. - THE SURGICAL RESECTION MARGINS ARE NEGATIVE FOR CARCINOMA. - SEE ONCOLOGY TABLE BELOW. 6. Lymph nodes, regional resection, Left axillary - METASTATIC CARCINOMA IN 1 OF 11 LYMPH NODES (1/11).       Impression: T2 N1 MX left breast cancer status post bilateral mastectomy  Plan: Patient is doing well. Return next week for followup. Prescription for camisole given. No driving or lifting.

## 2013-04-23 ENCOUNTER — Encounter: Payer: Self-pay | Admitting: *Deleted

## 2013-04-23 NOTE — Progress Notes (Signed)
04/23/2013 Per request by Dr. Welton Flakes, notified Tammy at [facility] [phone] to request that Dr. Colonel Bald proceed with Her-2/neu testing of the patient's left breast 12 o'clock tumor on Case #[id number] (lesion that tested negative for Her-2/neu on biopsy sample [phone]). Tammy will leave message for Dr. Colonel Bald to receive upon his return 04/26/2013. Cindy S. Clelia Croft BSN, RN, CCRP 04/23/2013 11:06 AM

## 2013-04-27 ENCOUNTER — Ambulatory Visit (INDEPENDENT_AMBULATORY_CARE_PROVIDER_SITE_OTHER): Payer: Medicare Other | Admitting: Surgery

## 2013-04-27 ENCOUNTER — Encounter (INDEPENDENT_AMBULATORY_CARE_PROVIDER_SITE_OTHER): Payer: Self-pay | Admitting: Surgery

## 2013-04-27 ENCOUNTER — Encounter (HOSPITAL_BASED_OUTPATIENT_CLINIC_OR_DEPARTMENT_OTHER): Payer: Self-pay | Admitting: *Deleted

## 2013-04-27 VITALS — BP 110/68 | HR 80 | Temp 98.0°F | Resp 18 | Ht 65.0 in | Wt 190.0 lb

## 2013-04-27 DIAGNOSIS — C50412 Malignant neoplasm of upper-outer quadrant of left female breast: Secondary | ICD-10-CM

## 2013-04-27 DIAGNOSIS — N6089 Other benign mammary dysplasias of unspecified breast: Secondary | ICD-10-CM

## 2013-04-27 DIAGNOSIS — C50419 Malignant neoplasm of upper-outer quadrant of unspecified female breast: Secondary | ICD-10-CM

## 2013-04-27 DIAGNOSIS — N6099 Unspecified benign mammary dysplasia of unspecified breast: Secondary | ICD-10-CM

## 2013-04-27 NOTE — Progress Notes (Signed)
General Surgery [facility]  Visit Diagnoses: 1. Breast cancer of upper-outer quadrant of left female breast, multifocal   2. Atypical ductal hyperplasia of breast, right     HISTORY: Patient is now 2-1/2 weeks postop from bilateral mastectomy. One drain has been removed. Two drains remain in place but output has decreased. She presents today for examination and drain removal.  EXAM: Surgical incisions are healing nicely. Skin flaps are viable. No sign of infection. Drains are removed from the right chest wall and left axilla today. Dry gauze dressings are placed.  IMPRESSION: #1 left breast carcinoma, status post mastectomy and axillary lymph node dissection #2 right breast atypical ductal hyperplasia, status post mastectomy  PLAN: Patient is scheduled for infusion port placement on August 19th. She will likely begin adjuvant chemotherapy in September 2014. Wound care instructions are given.  Velora Heckler, MD, FACS General & Endocrine Surgery [facility]

## 2013-04-27 NOTE — Patient Instructions (Signed)
Call if fluid accumulates on either side.  Velora Heckler, MD, [facility] Office: [phone]

## 2013-04-27 NOTE — Progress Notes (Signed)
No labs needed-here for bilat mastectomies 7/14-drains out

## 2013-04-28 ENCOUNTER — Telehealth: Payer: Self-pay | Admitting: *Deleted

## 2013-04-28 NOTE — Telephone Encounter (Signed)
Pt called and left me a message requesting for me to cancel all of her visits with Dr. Welton Flakes and treatments.  She has decided to go get treated at [facility].  I called and left her a message to find out the reason for her leaving as well as to make sure she had all of her records that she needed.  Cancelled all appts as pt requested.

## 2013-04-29 ENCOUNTER — Ambulatory Visit: Payer: 59 | Admitting: Oncology

## 2013-04-29 ENCOUNTER — Other Ambulatory Visit: Payer: 59 | Admitting: Lab

## 2013-05-04 ENCOUNTER — Ambulatory Visit (HOSPITAL_COMMUNITY): Payer: Medicare Other

## 2013-05-04 ENCOUNTER — Ambulatory Visit (HOSPITAL_BASED_OUTPATIENT_CLINIC_OR_DEPARTMENT_OTHER): Payer: Medicare Other | Admitting: Anesthesiology

## 2013-05-04 ENCOUNTER — Encounter (HOSPITAL_BASED_OUTPATIENT_CLINIC_OR_DEPARTMENT_OTHER)
Admission: RE | Disposition: A | Discharge status: Home / Self Care | Payer: Self-pay | Source: Ambulatory Visit | Attending: Surgery

## 2013-05-04 ENCOUNTER — Ambulatory Visit (HOSPITAL_BASED_OUTPATIENT_CLINIC_OR_DEPARTMENT_OTHER)
Admission: RE | Admit: 2013-05-04 | Discharge: 2013-05-04 | Disposition: A | Discharge status: Home / Self Care | Payer: Medicare Other | Source: Ambulatory Visit | Attending: Surgery | Admitting: Surgery

## 2013-05-04 ENCOUNTER — Encounter (HOSPITAL_BASED_OUTPATIENT_CLINIC_OR_DEPARTMENT_OTHER): Payer: Self-pay | Admitting: *Deleted

## 2013-05-04 ENCOUNTER — Encounter (HOSPITAL_BASED_OUTPATIENT_CLINIC_OR_DEPARTMENT_OTHER): Payer: Self-pay | Admitting: Anesthesiology

## 2013-05-04 DIAGNOSIS — N952 Postmenopausal atrophic vaginitis: Secondary | ICD-10-CM | POA: Insufficient documentation

## 2013-05-04 DIAGNOSIS — E785 Hyperlipidemia, unspecified: Secondary | ICD-10-CM | POA: Insufficient documentation

## 2013-05-04 DIAGNOSIS — D0590 Unspecified type of carcinoma in situ of unspecified breast: Secondary | ICD-10-CM | POA: Insufficient documentation

## 2013-05-04 DIAGNOSIS — N6089 Other benign mammary dysplasias of unspecified breast: Secondary | ICD-10-CM | POA: Insufficient documentation

## 2013-05-04 DIAGNOSIS — R011 Cardiac murmur, unspecified: Secondary | ICD-10-CM | POA: Insufficient documentation

## 2013-05-04 DIAGNOSIS — C50919 Malignant neoplasm of unspecified site of unspecified female breast: Secondary | ICD-10-CM

## 2013-05-04 DIAGNOSIS — M1990 Unspecified osteoarthritis, unspecified site: Secondary | ICD-10-CM | POA: Insufficient documentation

## 2013-05-04 DIAGNOSIS — N6019 Diffuse cystic mastopathy of unspecified breast: Secondary | ICD-10-CM | POA: Insufficient documentation

## 2013-05-04 DIAGNOSIS — M899 Disorder of bone, unspecified: Secondary | ICD-10-CM | POA: Insufficient documentation

## 2013-05-04 DIAGNOSIS — Z79899 Other long term (current) drug therapy: Secondary | ICD-10-CM | POA: Insufficient documentation

## 2013-05-04 DIAGNOSIS — C50412 Malignant neoplasm of upper-outer quadrant of left female breast: Secondary | ICD-10-CM | POA: Diagnosis present

## 2013-05-04 DIAGNOSIS — Z885 Allergy status to narcotic agent status: Secondary | ICD-10-CM | POA: Insufficient documentation

## 2013-05-04 DIAGNOSIS — N641 Fat necrosis of breast: Secondary | ICD-10-CM | POA: Insufficient documentation

## 2013-05-04 HISTORY — PX: PORTACATH PLACEMENT: SHX2246

## 2013-05-04 SURGERY — INSERTION, TUNNELED CENTRAL VENOUS DEVICE, WITH PORT
Anesthesia: General | Site: Chest | Laterality: Right | Wound class: Clean

## 2013-05-04 MED ORDER — CEFAZOLIN SODIUM-DEXTROSE 2-3 GM-% IV SOLR
INTRAVENOUS | Status: DC | PRN
  Administered 2013-05-04: 2 g via INTRAVENOUS

## 2013-05-04 MED ORDER — EPHEDRINE SULFATE 50 MG/ML IJ SOLN
INTRAMUSCULAR | Status: DC | PRN
  Administered 2013-05-04: 15 mg via INTRAVENOUS
  Administered 2013-05-04: 10 mg via INTRAVENOUS

## 2013-05-04 MED ORDER — HEPARIN SOD (PORK) LOCK FLUSH 100 UNIT/ML IV SOLN
INTRAVENOUS | Status: DC | PRN
  Administered 2013-05-04: 350 [IU] via INTRAVENOUS

## 2013-05-04 MED ORDER — HEPARIN (PORCINE) IN NACL 2-0.9 UNIT/ML-% IJ SOLN
INTRAMUSCULAR | Status: DC | PRN
  Administered 2013-05-04: 1 via INTRAVENOUS

## 2013-05-04 MED ORDER — FENTANYL CITRATE 0.05 MG/ML IJ SOLN: 50.0000 ug | INTRAMUSCULAR | Status: DC | PRN

## 2013-05-04 MED ORDER — HYDROCODONE-ACETAMINOPHEN 5-325 MG PO TABS: 1.0000 | ORAL_TABLET | ORAL | Status: DC | PRN

## 2013-05-04 MED ORDER — HYDROCODONE-ACETAMINOPHEN 5-325 MG PO TABS: 1.0000 | ORAL_TABLET | Freq: Once | ORAL | Status: DC

## 2013-05-04 MED ORDER — PROPOFOL 10 MG/ML IV BOLUS
INTRAVENOUS | Status: DC | PRN
  Administered 2013-05-04: 200 mg via INTRAVENOUS

## 2013-05-04 MED ORDER — LACTATED RINGERS IV SOLN
INTRAVENOUS | Status: DC
  Administered 2013-05-04: 20 mL/h via INTRAVENOUS
  Administered 2013-05-04: 12:00:00 via INTRAVENOUS

## 2013-05-04 MED ORDER — LIDOCAINE HCL (CARDIAC) 20 MG/ML IV SOLN
INTRAVENOUS | Status: DC | PRN
  Administered 2013-05-04: 50 mg via INTRAVENOUS

## 2013-05-04 MED ORDER — FENTANYL CITRATE 0.05 MG/ML IJ SOLN
INTRAMUSCULAR | Status: DC | PRN
  Administered 2013-05-04: 50 ug via INTRAVENOUS

## 2013-05-04 MED ORDER — ACETAMINOPHEN 325 MG PO TABS
650.0000 mg | ORAL_TABLET | Freq: Once | ORAL | Status: AC
  Administered 2013-05-04: 650 mg via ORAL

## 2013-05-04 MED ORDER — FENTANYL CITRATE 0.05 MG/ML IJ SOLN: 25.0000 ug | INTRAMUSCULAR | Status: DC | PRN

## 2013-05-04 MED ORDER — MIDAZOLAM HCL 5 MG/5ML IJ SOLN
INTRAMUSCULAR | Status: DC | PRN
  Administered 2013-05-04: 1 mg via INTRAVENOUS

## 2013-05-04 MED ORDER — BUPIVACAINE HCL (PF) 0.25 % IJ SOLN
INTRAMUSCULAR | Status: DC | PRN
  Administered 2013-05-04: 14 mL

## 2013-05-04 MED ORDER — MIDAZOLAM HCL 2 MG/2ML IJ SOLN: 1.0000 mg | INTRAMUSCULAR | Status: DC | PRN

## 2013-05-04 MED ORDER — DEXAMETHASONE SODIUM PHOSPHATE 4 MG/ML IJ SOLN
INTRAMUSCULAR | Status: DC | PRN
  Administered 2013-05-04: 10 mg via INTRAVENOUS

## 2013-05-04 MED ORDER — ONDANSETRON HCL 4 MG/2ML IJ SOLN
INTRAMUSCULAR | Status: DC | PRN
  Administered 2013-05-04: 4 mg via INTRAVENOUS

## 2013-05-04 SURGICAL SUPPLY — 51 items
APL SKNCLS STERI-STRIP NONHPOA (GAUZE/BANDAGES/DRESSINGS) ×1
BAG DECANTER FOR FLEXI CONT (MISCELLANEOUS) ×2 IMPLANT
BENZOIN TINCTURE PRP APPL 2/3 (GAUZE/BANDAGES/DRESSINGS) ×2 IMPLANT
BLADE SURG 15 STRL LF DISP TIS (BLADE) ×1 IMPLANT
BLADE SURG 15 STRL SS (BLADE) ×2
CANISTER SUCTION 1200CC (MISCELLANEOUS) IMPLANT
CHLORAPREP W/TINT 26ML (MISCELLANEOUS) ×2 IMPLANT
CLOTH BEACON ORANGE TIMEOUT ST (SAFETY) ×2 IMPLANT
COVER MAYO STAND STRL (DRAPES) ×2 IMPLANT
COVER TABLE BACK 60X90 (DRAPES) ×2 IMPLANT
DECANTER SPIKE VIAL GLASS SM (MISCELLANEOUS) IMPLANT
DRAPE C-ARM 42X72 X-RAY (DRAPES) ×2 IMPLANT
DRAPE LAPAROTOMY T 102X78X121 (DRAPES) ×2 IMPLANT
DRAPE UTILITY XL STRL (DRAPES) ×2 IMPLANT
DRSG TEGADERM 4X4.75 (GAUZE/BANDAGES/DRESSINGS) ×2 IMPLANT
ELECT REM PT RETURN 9FT ADLT (ELECTROSURGICAL) ×2
ELECTRODE REM PT RTRN 9FT ADLT (ELECTROSURGICAL) ×1 IMPLANT
GAUZE SPONGE 4X4 12PLY STRL LF (GAUZE/BANDAGES/DRESSINGS) ×2 IMPLANT
GLOVE BIO SURGEON STRL SZ7 (GLOVE) ×1 IMPLANT
GLOVE BIOGEL PI IND STRL 7.0 (GLOVE) IMPLANT
GLOVE BIOGEL PI INDICATOR 7.0 (GLOVE) ×1
GLOVE EXAM NITRILE MD LF STRL (GLOVE) ×1 IMPLANT
GLOVE SURG ORTHO 8.0 STRL STRW (GLOVE) ×2 IMPLANT
GOWN PREVENTION PLUS XLARGE (GOWN DISPOSABLE) ×2 IMPLANT
GOWN PREVENTION PLUS XXLARGE (GOWN DISPOSABLE) ×2 IMPLANT
IV KIT MINILOC 20X1 SAFETY (NEEDLE) IMPLANT
KIT BARDPORT ISP (Port) IMPLANT
KIT PORT POWER 8FR ISP CVUE (Catheter) ×1 IMPLANT
NDL BLUNT 17GA (NEEDLE) IMPLANT
NDL HYPO 25X1 1.5 SAFETY (NEEDLE) ×1 IMPLANT
NDL SAFETY ECLIPSE 18X1.5 (NEEDLE) IMPLANT
NEEDLE BLUNT 17GA (NEEDLE) IMPLANT
NEEDLE HYPO 18GX1.5 SHARP (NEEDLE)
NEEDLE HYPO 25X1 1.5 SAFETY (NEEDLE) ×2 IMPLANT
PACK BASIN DAY SURGERY FS (CUSTOM PROCEDURE TRAY) ×2 IMPLANT
PENCIL BUTTON HOLSTER BLD 10FT (ELECTRODE) ×2 IMPLANT
SET SHEATH INTRODUCER 10FR (MISCELLANEOUS) IMPLANT
SHEATH COOK PEEL AWAY SET 9F (SHEATH) IMPLANT
SLEEVE SCD COMPRESS KNEE MED (MISCELLANEOUS) ×1 IMPLANT
STRIP CLOSURE SKIN 1/2X4 (GAUZE/BANDAGES/DRESSINGS) ×2 IMPLANT
SUT PROLENE 2 0 CT2 30 (SUTURE) ×2 IMPLANT
SUT VIC AB 3-0 SH 27 (SUTURE) ×2
SUT VIC AB 3-0 SH 27X BRD (SUTURE) ×1 IMPLANT
SUT VICRYL 4-0 PS2 18IN ABS (SUTURE) ×2 IMPLANT
SYR 5ML LUER SLIP (SYRINGE) ×2 IMPLANT
SYR CONTROL 10ML LL (SYRINGE) ×2 IMPLANT
TAPE HYPAFIX 4 X10 (GAUZE/BANDAGES/DRESSINGS) IMPLANT
TOWEL OR 17X24 6PK STRL BLUE (TOWEL DISPOSABLE) ×3 IMPLANT
TOWEL OR NON WOVEN STRL DISP B (DISPOSABLE) ×2 IMPLANT
TUBE CONNECTING 20X1/4 (TUBING) IMPLANT
YANKAUER SUCT BULB TIP NO VENT (SUCTIONS) IMPLANT

## 2013-05-04 NOTE — Transfer of Care (Signed)
Immediate Anesthesia Transfer of Care Note  Patient: [name]  Procedure(s) Performed: Procedure(s): INSERTION PORT-A-CATH (Right)  Patient Location: PACU  Anesthesia Type:General  Level of Consciousness: awake, alert  and oriented  Airway & Oxygen Therapy: Patient Spontanous Breathing and Patient connected to face mask oxygen  Post-op Assessment: Report given to PACU RN and Post -op Vital signs reviewed and stable  Post vital signs: Reviewed and stable  Complications: No apparent anesthesia complications

## 2013-05-04 NOTE — Anesthesia Procedure Notes (Signed)
Procedure Name: LMA Insertion Date/Time: 05/04/2013 11:39 AM Performed by: Zenia Resides D Pre-anesthesia Checklist: Patient identified, Emergency Drugs available, Suction available and Patient being monitored Patient Re-evaluated:Patient Re-evaluated prior to inductionOxygen Delivery Method: Circle System Utilized Preoxygenation: Pre-oxygenation with 100% oxygen Intubation Type: IV induction Ventilation: Mask ventilation without difficulty LMA: LMA inserted LMA Size: 4.0 Number of attempts: 1 Airway Equipment and Method: bite block Placement Confirmation: positive ETCO2 Tube secured with: Tape Dental Injury: Teeth and Oropharynx as per pre-operative assessment

## 2013-05-04 NOTE — Anesthesia Preprocedure Evaluation (Signed)
Anesthesia Evaluation  Patient identified by MRN, date of birth, ID band Patient awake    Reviewed: Allergy & Precautions, H&P , NPO status , Patient's Chart, lab work & pertinent test results  Airway Mallampati: II      Dental   Pulmonary          Cardiovascular negative cardio ROS      Neuro/Psych    GI/Hepatic negative GI ROS, Neg liver ROS,   Endo/Other  negative endocrine ROS  Renal/GU negative Renal ROS     Musculoskeletal   Abdominal   Peds  Hematology   Anesthesia Other Findings   Reproductive/Obstetrics                           Anesthesia Physical Anesthesia Plan  ASA: II  Anesthesia Plan: General   Post-op Pain Management:    Induction: Intravenous  Airway Management Planned: LMA  Additional Equipment:   Intra-op Plan:   Post-operative Plan: Extubation in OR  Informed Consent: I have reviewed the patients History and Physical, chart, labs and discussed the procedure including the risks, benefits and alternatives for the proposed anesthesia with the patient or authorized representative who has indicated his/her understanding and acceptance.   Dental advisory given  Plan Discussed with: CRNA, Anesthesiologist and Surgeon  Anesthesia Plan Comments:         Anesthesia Quick Evaluation

## 2013-05-04 NOTE — Interval H&P Note (Signed)
History and Physical Interval Note:  05/04/2013 11:22 AM  [name]  has presented today for surgery, with the diagnosis of BREAST CANCER.   The various methods of treatment have been discussed with the patient and family. After consideration of risks, benefits and other options for treatment, the patient has consented to    Procedure(s): INSERTION PORT-A-CATH (N/A) as a surgical intervention .    The patient's history has been reviewed, patient examined, no change in status, stable for surgery.  I have reviewed the patient's chart and labs.  Questions were answered to the patient's satisfaction.    Velora Heckler, MD, [facility] Office: [phone]   Caoimhe Damron Judie Petit

## 2013-05-04 NOTE — Anesthesia Postprocedure Evaluation (Signed)
  Anesthesia Post-op Note  Patient: [name]  Procedure(s) Performed: Procedure(s): INSERTION PORT-A-CATH (Right)  Patient Location: PACU  Anesthesia Type:General  Level of Consciousness: awake, alert  and oriented  Airway and Oxygen Therapy: Patient Spontanous Breathing  Post-op Pain: mild  Post-op Assessment: Post-op Vital signs reviewed, Patient's Cardiovascular Status Stable, Respiratory Function Stable, Patent Airway and No signs of Nausea or vomiting  Post-op Vital Signs: Reviewed and stable  Complications: No apparent anesthesia complications

## 2013-05-04 NOTE — Brief Op Note (Signed)
05/04/2013  12:31 PM  PATIENT:  [name]  67 y.o. female  PRE-OPERATIVE DIAGNOSIS:  BREAST CANCER   POST-OPERATIVE DIAGNOSIS:  Same   PROCEDURE:  Procedure(s): INSERTION PORT-A-CATH (Right)  SURGEON:  Surgeon(s) and Role:    * Velora Heckler, MD - Primary  ANESTHESIA:   general  EBL:     BLOOD ADMINISTERED:none  DRAINS: none   LOCAL MEDICATIONS USED:  MARCAINE     SPECIMEN:  No Specimen  DISPOSITION OF SPECIMEN:  N/A  COUNTS:  YES  TOURNIQUET:  * No tourniquets in log *  DICTATION: .Other Dictation: Dictation Number [id number]  PLAN OF CARE: Discharge to home after PACU  PATIENT DISPOSITION:  PACU - hemodynamically stable.   Delay start of Pharmacological VTE agent (>24hrs) due to surgical blood loss or risk of bleeding: yes  Velora Heckler, MD, [facility] Office: [phone]

## 2013-05-04 NOTE — H&P (View-Only) (Signed)
[name] is an 67 y.o. female.    General Surgery [facility]  Chief Complaint: left breast carcinoma, multicentric; right breast atypical ductal hyperplasia  HPI: Patient is a 67 year old female known to my practice from previous right breast excisional biopsy. Last year she underwent right breast excisional biopsy showing atypical ductal hyperplasia with calcifications. No malignancy was identified.   On routine self-examination approximately 10 days ago the patient noted a mass in the left breast. She presented to her gynecologist office and was referred for diagnostic studies. Mammogram was performed. Core needle biopsy was obtained on 2 lesions in the left breast, the first at the 12:00 position and the second at the 2:00 position. 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  Patient underwent bilateral breast MRI scan. Results show the disease in the left breast with likely additional disease extending towards the nipple areolar complex. There is also a area of suspicion in the right breast which may represent fat necrosis from her prior surgical procedure. Biopsy of the area in the right breast was performed and confirmed fat necrosis.  After extensive discussion at Breast Conference and with the patient, she now comes to surgery for bilateral mastectomy and left sentinel lymph node biopsy.  Patient was evaluated by plastic surgery and has opted to delay any reconstruction at this time.   Past Medical History  Diagnosis Date  . Hyperlipidemia   . Microcalcifications of the breast     atypical ductal hyperplasia with calcifications  . Breast lobular neoplasia     atypical  . Fibrocystic breast changes   . Osteopenia     Dr Lily Peer  . PMB (postmenopausal bleeding)     Dr Lily Peer  . Atrophic vaginitis   . Seasonal allergies   . Arthritis     osteoarthritis   . Wears glasses   . Heart murmur     grade 1  per patient  -echo 7/14    Past Surgical History  Procedure Laterality Date  . Endometrial biopsy  2002    Uterine  polyps, Dr. Eda Paschal  . Colonoscopy  [id number], [id number]    polyps 2013 only, Dr. Russella Dar  . Cesarean section  1976  . Dilation and curettage of uterus    . Hysteroscopy    . Breast biopsy  01/09/2012    Procedure: BREAST BIOPSY WITH NEEDLE LOCALIZATION;  Surgeon: Velora Heckler, MD;  Location: [facility];  Service: General;  Laterality: Right;  . Abdominal hysterectomy N/A 03/02/2013    Procedure: TOTAL HYSTERECTOMY ABDOMINAL;  Surgeon: Rejeana Brock A. Duard Brady, MD;  Location: [facility];  Service: Gynecology;  Laterality: N/A;  . Salpingoophorectomy Bilateral 03/02/2013    Procedure: BILATERAL SALPINGO OOPHORECTOMY;  Surgeon: Rejeana Brock A. Duard Brady, MD;  Location: [facility];  Service: Gynecology;  Laterality: Bilateral;    Family History  Problem Relation Age of Onset  . Alcohol abuse Mother   . Cancer Father     bone marrow cancer  . Alcohol abuse Father   . Von Willebrand disease Father   . Depression Son     Recovering alcoholic  . Depression Maternal Aunt   . Prostate cancer Maternal Uncle   . Diabetes Paternal Uncle   . Heart attack Paternal Uncle 51  . Cancer Paternal Grandmother     skin cancer  . Heart failure Paternal Grandmother     in 90s  . Colon cancer Neg Hx   . Stomach cancer Neg Hx   .  Stroke Neg Hx   . GER disease Paternal Grandmother     esophageal stricture  . GER disease Son    Social History:  reports that she has never smoked. She has never used smokeless tobacco. She reports that she drinks about 1.2 ounces of alcohol per week. She reports that she does not use illicit drugs.  Allergies:  Allergies  Allergen Reactions  . Codeine Rash    Post C section  . Oxycodone Itching    Medications Prior to Admission  Medication Sig Dispense Refill  . Calcium 250 MG CAPS Take 1 capsule by mouth daily.      . cholecalciferol (VITAMIN D) 1000 UNITS tablet Take  1,000 Units by mouth daily.      . fluticasone (FLONASE) 50 MCG/ACT nasal spray Place 2 sprays into the nose daily as needed for rhinitis or allergies.      Marland Kitchen GLUCOSAMINE SULFATE PO Take 1 tablet by mouth daily.      Marland Kitchen loratadine (CLARITIN) 10 MG tablet Take 10 mg by mouth daily.      . Magnesium 500 MG TABS Take 500 mg by mouth daily.      . Multiple Vitamin (MULTIVITAMIN) tablet Take 1 tablet by mouth daily.        . Omega-3 Fatty Acids (FISH OIL) 1000 MG CAPS Take by mouth daily.        Bertram Gala Glycol-Propyl Glycol (SYSTANE) 0.4-0.3 % SOLN Apply to eye. 2-3 x daily       . Vitamin D, Ergocalciferol, (DRISDOL) 50000 UNITS CAPS Take 50,000 Units by mouth every 14 (fourteen) days.      Marland Kitchen zolpidem (AMBIEN) 5 MG tablet Take 1 tablet (5 mg total) by mouth at bedtime as needed for sleep.  10 tablet  0  . NONFORMULARY OR COMPOUNDED ITEM Estradiol .02% 1 ML Prefilled Applicator Sig: apply vaginally twice a week #90 Day Supply with 4 refills  1 each  4  . traMADol (ULTRAM) 50 MG tablet Take 1 tablet (50 mg total) by mouth every 6 (six) hours as needed for pain.  10 tablet  0  . VALERIAN ROOT PO Take by mouth at bedtime as needed.        Results for orders placed during the hospital encounter of 04/08/13 (from the past 48 hour(s))  POCT HEMOGLOBIN-HEMACUE     Status: None   Collection Time    04/08/13  9:22 AM      Result Value Range   Hemoglobin 13.9  12.0 - 15.0 g/dL   No results found.  Review of Systems  Constitutional: Negative.   HENT: Negative.   Eyes: Negative.   Respiratory: Negative.   Cardiovascular: Negative.   Gastrointestinal: Negative.   Genitourinary: Negative.   Musculoskeletal: Negative.   Skin: Negative.   Neurological: Negative.   Endo/Heme/Allergies: Negative.   Psychiatric/Behavioral: Negative.     Blood pressure 122/88, pulse 77, temperature 97.7 F (36.5 C), temperature source Oral, resp. rate 18, height 5' 4.5" (1.638 m), weight 192 lb 8 oz (87.317 kg),  SpO2 97.00%. Physical Exam  Constitutional: She is oriented to person, place, and time. She appears well-developed and well-nourished. No distress.  HENT:  Head: Normocephalic and atraumatic.  Right Ear: External ear normal.  Left Ear: External ear normal.  Mouth/Throat: Oropharynx is clear and moist.  Eyes: Conjunctivae are normal. Pupils are equal, round, and reactive to light. Left eye exhibits no discharge. No scleral icterus.  Neck: Normal range of motion. Neck supple.  No thyromegaly present.  Cardiovascular: Normal rate, regular rhythm and normal heart sounds.   No murmur heard. Respiratory: Effort normal and breath sounds normal. She has no wheezes.  Right breast with well healed incision upper breast.  Left breast with palpable mass upper breast.  Biopsy sites well healed.  No axillary lymphadenopathy.  GI: Soft. Bowel sounds are normal. She exhibits no distension.  Musculoskeletal: Normal range of motion. She exhibits no edema.  Lymphadenopathy:    She has no cervical adenopathy.  Neurological: She is alert and oriented to person, place, and time.  Skin: Skin is warm and dry.  Psychiatric: She has a normal mood and affect. Her behavior is normal.     Assessment/Plan Left breast carcinoma, multifocal  Right breast atypical ductal hyperplasia  Plan bilateral mastectomy with left sentinel lymph node biopsy  The risks and benefits of the procedure have been discussed at length with the patient.  The patient understands the proposed procedure, potential alternative treatments, and the course of recovery to be expected.  All of the patient's questions have been answered at this time.  The patient wishes to proceed with surgery.  Velora Heckler, MD, [facility] Office: [phone]    Bonney Berres M 04/08/2013, 9:57 AM

## 2013-05-05 ENCOUNTER — Encounter (HOSPITAL_BASED_OUTPATIENT_CLINIC_OR_DEPARTMENT_OTHER): Payer: Self-pay | Admitting: Surgery

## 2013-05-05 NOTE — Op Note (Signed)
[name], [name]                 ACCOUNT NO.:  [id number]  MEDICAL RECORD NO.:  [id number]  LOCATION:                               FACILITY:  [facility]  PHYSICIAN:  Velora Heckler, MD      DATE OF BIRTH:  July 22, 1946  DATE OF PROCEDURE:  05/04/2013 DATE OF DISCHARGE:  05/04/2013                              OPERATIVE REPORT   PREOPERATIVE DIAGNOSIS:  Left breast carcinoma.  POSTOPERATIVE DIAGNOSIS:  Left breast carcinoma.  PROCEDURE:  Placement of right subclavian vein infusion port.  SURGEON:  Velora Heckler, MD  ANESTHESIA:  General per, Judie Petit, MD  ESTIMATED BLOOD LOSS:  Minimal.  PREPARATION:  ChloraPrep.  COMPLICATIONS:  None.  INDICATIONS:  The patient is a 67 year old female undergoing treatment for left breast carcinoma.  She underwent bilateral mastectomy 3 weeks ago.  An infusion port is desired by Medical Oncology to facilitate chemotherapy.  The patient now comes to the operating room for placement.  BODY OF REPORT:  Procedure was done in OR #5 at the [facility].  The patient was brought to the operating room and placed in a supine position on the operating room table.  Following administration of general anesthesia, the patient was positioned and then prepped and draped in the usual aseptic fashion.  After ascertaining that an adequate level of anesthesia had been achieved, the skin beneath the right clavicle was anesthetized with local anesthetic.  This was infiltrated to the clavicle.  The patient was placed in Trendelenburg. Using an 18-gauge Seldinger needle, the right subclavian vein was entered in a single pass.  A soft J-shaped guidewire was inserted and the needle removed.  C-arm fluoroscopy confirms guidewire placement.  Next, the right chest wall was anesthetized with local anesthetic.  A 2- cm incision was made with a #15 blade.  A subcutaneous pocket is created to accommodate a Bard ClearVUE PowerPort.  Port was prepared  and flushed with heparinized saline.  Port was assembled.  Port was inserted into the subcutaneous tissues and the catheter was tunneled subcutaneously to the site of guidewire insertion.  Port was secured to the chest wall with interrupted 2-0 Prolene sutures.  Catheter was measured to 25 cm and divided.  Using a dilator and peel-away sleeve, the catheter was introduced into the right subclavian vein and advanced.  At this point, the guidewire, dilator, and peel-away sleeve have all been removed.  The port aspirates blood easily and flushes easily with heparinized saline. 4 mL of 100 unit/mL heparin flush were placed through the port.  Subcutaneous tissues were closed with interrupted 3-0 Vicryl sutures. Skin was closed with a running 4-0 Monocryl subcuticular suture.  Wound was washed and dried and benzoin Steri-Strips were applied.  Sterile dressings were applied.  The patient was awakened from anesthesia and brought to the recovery room.  A chest x-Tilly will be obtained in the recovery room to confirm catheter placement.  The patient tolerated the procedure well.     Velora Heckler, MD     TMG/MEDQ  D:  05/04/2013  T:  05/05/2013  Job:  [id number]

## 2013-05-07 ENCOUNTER — Ambulatory Visit: Payer: Medicare Other

## 2013-05-07 ENCOUNTER — Telehealth (INDEPENDENT_AMBULATORY_CARE_PROVIDER_SITE_OTHER): Payer: Self-pay

## 2013-05-07 NOTE — Telephone Encounter (Signed)
Pt advised of po f/u date. Pt will call with any questions or concerns.

## 2013-05-13 ENCOUNTER — Ambulatory Visit (INDEPENDENT_AMBULATORY_CARE_PROVIDER_SITE_OTHER): Payer: Medicare Other | Admitting: *Deleted

## 2013-05-13 DIAGNOSIS — Z23 Encounter for immunization: Secondary | ICD-10-CM

## 2013-05-14 ENCOUNTER — Other Ambulatory Visit: Payer: Medicare Other | Admitting: Lab

## 2013-05-14 ENCOUNTER — Ambulatory Visit: Payer: Medicare Other | Admitting: Adult Health

## 2013-05-29 ENCOUNTER — Other Ambulatory Visit: Payer: Self-pay | Admitting: Internal Medicine

## 2013-05-31 NOTE — Telephone Encounter (Signed)
Med filled.  

## 2013-06-09 ENCOUNTER — Ambulatory Visit (INDEPENDENT_AMBULATORY_CARE_PROVIDER_SITE_OTHER): Payer: Medicare Other | Admitting: Surgery

## 2013-06-09 ENCOUNTER — Encounter (INDEPENDENT_AMBULATORY_CARE_PROVIDER_SITE_OTHER): Payer: Self-pay | Admitting: Surgery

## 2013-06-09 VITALS — BP 114/66 | HR 80 | Temp 97.4°F | Resp 14 | Ht 65.0 in | Wt 187.0 lb

## 2013-06-09 DIAGNOSIS — C50412 Malignant neoplasm of upper-outer quadrant of left female breast: Secondary | ICD-10-CM

## 2013-06-09 DIAGNOSIS — R29898 Other symptoms and signs involving the musculoskeletal system: Secondary | ICD-10-CM

## 2013-06-09 DIAGNOSIS — C50419 Malignant neoplasm of upper-outer quadrant of unspecified female breast: Secondary | ICD-10-CM

## 2013-06-09 DIAGNOSIS — M2560 Stiffness of unspecified joint, not elsewhere classified: Secondary | ICD-10-CM

## 2013-06-09 NOTE — Progress Notes (Signed)
General Surgery [facility]  Chief Complaint  Patient presents with  . Routine Post Op    Port placement 05/04/2013    HISTORY: Patient is a 67 year old female who underwent bilateral mastectomies for carcinoma of the breast. She had a Port-A-Cath placed on August 19. She has begun chemotherapy at [facility]. Port-A-Cath has functioned perfectly for her initial treatment.  EXAM: Surgical incisions have healed nicely. No seroma. No sign of infection.  IMPRESSION: Status post bilateral mastectomy and Port-A-Cath placement  PLAN: Patient will be referred to physical therapy to begin postmastectomy exercise routine. I will give her a prescription for prostheses. She will return for surgical follow-up in four-month period  Velora Heckler, MD, FACS General & Endocrine Surgery [facility]   Visit Diagnoses: 1. Breast cancer of upper-outer quadrant of left female breast, multifocal   2. Range of motion deficit

## 2013-06-09 NOTE — Patient Instructions (Signed)
  COCOA BUTTER & VITAMIN E CREAM  (Palmer's or other brand)  Apply cocoa butter/vitamin E cream to your incision 2 - 3 times daily.  Massage cream into incision for one minute with each application.  Use sunscreen (50 SPF or higher) for first 6 months after surgery if area is exposed to sun.  You may substitute Mederma or other scar reducing creams as desired.   

## 2013-06-11 ENCOUNTER — Telehealth (INDEPENDENT_AMBULATORY_CARE_PROVIDER_SITE_OTHER): Payer: Self-pay | Admitting: *Deleted

## 2013-06-11 NOTE — Telephone Encounter (Signed)
LMOM for pt to return my call. This is in regards to me needing to inform her that she needs to call the outpatient rehabilitation center to schedule her PT appt at the number [phone].

## 2013-06-23 ENCOUNTER — Ambulatory Visit: Payer: Medicare Other | Attending: Surgery | Admitting: Physical Therapy

## 2013-06-23 DIAGNOSIS — C50919 Malignant neoplasm of unspecified site of unspecified female breast: Secondary | ICD-10-CM | POA: Insufficient documentation

## 2013-06-23 DIAGNOSIS — IMO0001 Reserved for inherently not codable concepts without codable children: Secondary | ICD-10-CM | POA: Insufficient documentation

## 2013-06-23 DIAGNOSIS — M24519 Contracture, unspecified shoulder: Secondary | ICD-10-CM | POA: Insufficient documentation

## 2013-06-29 ENCOUNTER — Ambulatory Visit: Payer: Medicare Other | Admitting: Physical Therapy

## 2013-07-01 ENCOUNTER — Ambulatory Visit: Payer: Medicare Other

## 2013-07-06 ENCOUNTER — Telehealth (HOSPITAL_COMMUNITY): Payer: Self-pay | Admitting: Cardiology

## 2013-07-06 ENCOUNTER — Ambulatory Visit: Payer: Medicare Other

## 2013-07-06 NOTE — Telephone Encounter (Signed)
ATTEMPTING TO SCHEDULE FOLLOW UP WITH [facility] CLINIC FOR 3 MONTH FOLLOW UP UP ECHO PT STATES SHE IS NOW A PATIENT OF [facility] AND WILL NO LONGER BE COMING HERE FOR FOLLOW UP

## 2013-07-08 ENCOUNTER — Ambulatory Visit: Payer: Medicare Other | Admitting: Physical Therapy

## 2013-07-13 ENCOUNTER — Ambulatory Visit: Payer: Medicare Other

## 2013-07-15 ENCOUNTER — Ambulatory Visit: Payer: Medicare Other | Admitting: Physical Therapy

## 2013-07-20 ENCOUNTER — Ambulatory Visit: Payer: Medicare Other | Attending: Surgery

## 2013-07-20 DIAGNOSIS — IMO0001 Reserved for inherently not codable concepts without codable children: Secondary | ICD-10-CM | POA: Insufficient documentation

## 2013-07-20 DIAGNOSIS — C50919 Malignant neoplasm of unspecified site of unspecified female breast: Secondary | ICD-10-CM | POA: Insufficient documentation

## 2013-07-20 DIAGNOSIS — M24519 Contracture, unspecified shoulder: Secondary | ICD-10-CM | POA: Insufficient documentation

## 2013-07-22 ENCOUNTER — Encounter: Payer: Medicare Other | Admitting: Physical Therapy

## 2013-07-22 ENCOUNTER — Other Ambulatory Visit: Payer: Self-pay

## 2013-07-23 ENCOUNTER — Ambulatory Visit: Payer: Medicare Other | Admitting: Physical Therapy

## 2013-07-27 ENCOUNTER — Ambulatory Visit: Payer: Medicare Other

## 2013-08-04 ENCOUNTER — Ambulatory Visit: Payer: Medicare Other

## 2013-08-10 ENCOUNTER — Other Ambulatory Visit (INDEPENDENT_AMBULATORY_CARE_PROVIDER_SITE_OTHER): Payer: Self-pay | Admitting: *Deleted

## 2013-08-10 MED ORDER — UNABLE TO FIND: Status: DC

## 2013-10-08 ENCOUNTER — Ambulatory Visit (INDEPENDENT_AMBULATORY_CARE_PROVIDER_SITE_OTHER): Payer: Medicare Other | Admitting: Surgery

## 2013-10-08 ENCOUNTER — Encounter (INDEPENDENT_AMBULATORY_CARE_PROVIDER_SITE_OTHER): Payer: Self-pay | Admitting: Surgery

## 2013-10-08 VITALS — BP 121/87 | HR 75 | Temp 97.8°F | Resp 14 | Ht 64.5 in | Wt 188.2 lb

## 2013-10-08 DIAGNOSIS — Z853 Personal history of malignant neoplasm of breast: Secondary | ICD-10-CM

## 2013-10-08 NOTE — Progress Notes (Signed)
General Surgery [facility]  Chief Complaint  Patient presents with  . Breast Cancer Long Term Follow Up    bilateral mastectomy, port placement    HISTORY: The patient is a 68 year old female who underwent bilateral mastectomy in the summer of 2014. Following Port-A-Cath placement, she has been undergoing adjuvant chemotherapy at [facility]. She also completed her course of treatment with physical therapy as an outpatient.  PERTINENT REVIEW OF SYSTEMS: Denies any new masses. Denies pain. Denies any cutaneous changes. Port-A-Cath is functioning normally.  EXAM: HEENT: normocephalic; pupils equal and reactive; sclerae clear; dentition good; mucous membranes moist NECK:  No palpable masses in the thyroid bed; symmetric on extension; no palpable anterior or posterior cervical lymphadenopathy; no supraclavicular masses; no tenderness CHEST: clear to auscultation bilaterally without rales, rhonchi, or wheezes; chest wall incisions are well healed; no subcutaneous masses; no cutaneous lesions; no lymphadenopathy bilaterally CARDIAC: regular rate and rhythm without significant murmur; peripheral pulses are full EXT:  non-tender without edema; no deformity NEURO: no gross focal deficits; no sign of tremor   IMPRESSION: Personal history of carcinoma of the breast, status post bilateral mastectomy, no evidence of recurrent disease  PLAN: Patient is scheduled to complete adjuvant chemotherapy since. She will continue to take Herceptin.  She will contact me when her oncologist tells her to have her infusion port removed.  Patient will return for physical examination in 6 months.  [name], MD, [facility] Office: [phone]  Visit Diagnoses: 1. Personal history of malignant neoplasm of breast

## 2013-10-08 NOTE — Patient Instructions (Signed)

## 2013-11-09 ENCOUNTER — Telehealth: Payer: Self-pay | Admitting: *Deleted

## 2013-11-09 NOTE — Telephone Encounter (Signed)
I saw that the GYN oncologist did her total hysterectomy last year and it was benign. Also her oncologist is following her because of her breast cancer history. Perhaps they control her vitamin D level and continued to order her bone density study or she can come here once a year for exam as well.

## 2013-11-09 NOTE — Telephone Encounter (Signed)
Pt received recall letter in mail regarding scheduling annual for this year. Pt has had hysterectomy and has underwent bilateral mastectomies for carcinoma of the breast followed by her oncologist.(all in epic) Pt said she does receive a Vitamin D Rx from you but that's all. Pt asked if annual should be schedule still? Please advise

## 2013-11-15 NOTE — Telephone Encounter (Signed)
Left the below on patient voicemail.

## 2014-01-03 ENCOUNTER — Other Ambulatory Visit (INDEPENDENT_AMBULATORY_CARE_PROVIDER_SITE_OTHER): Payer: Self-pay

## 2014-01-03 MED ORDER — UNABLE TO FIND: Status: DC

## 2014-01-18 ENCOUNTER — Other Ambulatory Visit (INDEPENDENT_AMBULATORY_CARE_PROVIDER_SITE_OTHER): Payer: Self-pay

## 2014-01-18 MED ORDER — UNABLE TO FIND: Status: DC

## 2014-02-01 ENCOUNTER — Other Ambulatory Visit (INDEPENDENT_AMBULATORY_CARE_PROVIDER_SITE_OTHER): Payer: Self-pay

## 2014-02-01 MED ORDER — UNABLE TO FIND: Status: DC

## 2014-03-09 ENCOUNTER — Encounter (INDEPENDENT_AMBULATORY_CARE_PROVIDER_SITE_OTHER): Payer: Self-pay | Admitting: Surgery

## 2014-05-10 ENCOUNTER — Encounter (INDEPENDENT_AMBULATORY_CARE_PROVIDER_SITE_OTHER): Payer: Self-pay | Admitting: Surgery

## 2014-05-10 ENCOUNTER — Ambulatory Visit (INDEPENDENT_AMBULATORY_CARE_PROVIDER_SITE_OTHER): Payer: Medicare Other | Admitting: Surgery

## 2014-05-10 VITALS — BP 94/62 | HR 81 | Temp 97.8°F | Ht 64.5 in | Wt 188.4 lb

## 2014-05-10 DIAGNOSIS — Z853 Personal history of malignant neoplasm of breast: Secondary | ICD-10-CM

## 2014-05-10 NOTE — Progress Notes (Signed)
General Surgery [facility]  Chief Complaint  Patient presents with  . Breast Cancer Long Term Follow Up    bilateral mastetomy - July 2014    HISTORY: Patient is a 68 year old female followed for personal history of breast carcinoma. She underwent bilateral mastectomy in July 2014. She is undergoing adjuvant chemotherapy at [facility]. Treatment has recently been suspended the to cardiac complications from Adriamycin and herceptin. She hopes to resume treatment in the near future. There has been no problems with the function of her infusion port.  PERTINENT REVIEW OF SYSTEMS: The patient denies any new findings along the chest wall. No skin changes. No new masses. No pain.  EXAM: HEENT: normocephalic; pupils equal and reactive; sclerae clear; dentition good; mucous membranes moist NECK:  No masses in the thyroid bed; symmetric on extension; no palpable anterior or posterior cervical lymphadenopathy; no supraclavicular masses; no tenderness CHEST: clear to auscultation bilaterally without rales, rhonchi, or wheezes CARDIAC: regular rate and rhythm without significant murmur; peripheral pulses are full BREAST: Right chest wall with well-healed transverse incision; no subcutaneous masses; right axilla free of adenopathy; Port-A-Cath in the upper right chest wall well positioned with no sign of fluid or infection. Left chest wall with well-healed transverse incision; no subcutaneous masses; slight thickening of adipose tissue laterally without discrete or dominant mass; left axilla free of adenopathy. EXT:  non-tender without edema; no deformity NEURO: no gross focal deficits; no sign of tremor   IMPRESSION: Personal history of carcinoma of the breast, status post bilateral mastectomy, no clinical evidence of recurrent disease  PLAN: Patient will continue treatment under the direction of the cancer center at [facility]  Patient will return in 1 year for follow-up.  Patient will contact me when it is time to remove her infusion port.  [name], MD, [facility] Office: [phone]  Visit Diagnoses: 1. Personal history of malignant neoplasm of breast

## 2014-07-01 ENCOUNTER — Other Ambulatory Visit: Payer: Self-pay

## 2014-07-18 ENCOUNTER — Encounter (INDEPENDENT_AMBULATORY_CARE_PROVIDER_SITE_OTHER): Payer: Self-pay | Admitting: Surgery

## 2014-08-15 ENCOUNTER — Other Ambulatory Visit: Payer: Self-pay | Admitting: Internal Medicine

## 2014-08-15 DIAGNOSIS — E041 Nontoxic single thyroid nodule: Secondary | ICD-10-CM

## 2014-08-19 ENCOUNTER — Ambulatory Visit
Admission: RE | Admit: 2014-08-19 | Discharge: 2014-08-19 | Disposition: A | Discharge status: Home / Self Care | Payer: Medicare Other | Source: Ambulatory Visit | Attending: Internal Medicine | Admitting: Internal Medicine

## 2014-08-19 DIAGNOSIS — E041 Nontoxic single thyroid nodule: Secondary | ICD-10-CM

## 2014-12-02 ENCOUNTER — Ambulatory Visit: Payer: Self-pay | Admitting: Surgery

## 2014-12-08 ENCOUNTER — Other Ambulatory Visit: Payer: Self-pay | Admitting: Physician Assistant

## 2014-12-08 DIAGNOSIS — M25562 Pain in left knee: Secondary | ICD-10-CM

## 2014-12-27 ENCOUNTER — Other Ambulatory Visit: Payer: Medicare Other

## 2014-12-27 ENCOUNTER — Encounter (HOSPITAL_BASED_OUTPATIENT_CLINIC_OR_DEPARTMENT_OTHER): Payer: Self-pay | Admitting: *Deleted

## 2014-12-27 NOTE — Progress Notes (Signed)
No labs needed-off lisinopril after chemo

## 2014-12-30 ENCOUNTER — Encounter (HOSPITAL_BASED_OUTPATIENT_CLINIC_OR_DEPARTMENT_OTHER): Payer: Self-pay | Admitting: Surgery

## 2014-12-30 NOTE — H&P (Signed)
General Surgery [facility]  [name] DOB: September 27, 1945 Divorced / Language: English / Race: White Female  History of Present Illness Patient words: discuss port removal.  The patient is a 69 year old female who presents with breast cancer. Patient returns for follow-up. She completed her chemotherapy in February 2016. Her oncologist at [facility] with like for her to have her infusion port removed. Patient presents today to discuss removal. Port has functioned well throughout her chemotherapy. There've been no complications.   Other Problems  Breast Cancer  Past Surgical History  Breast Biopsy Bilateral. Cesarean Section - 1 Colon Polyp Removal - Colonoscopy Hysterectomy (not due to cancer) - Complete Mastectomy Bilateral.  Diagnostic Studies History  Colonoscopy 5-10 years ago Mammogram 1-3 years ago Pap Smear 1-5 years ago  Allergies  Codeine Phosphate *ANALGESICS - OPIOID*  Medication History  Anastrozole (1MG  Tablet, Oral) Active. Citalopram Hydrobromide (10MG  Tablet, Oral) Active. Fluticasone Propionate (50MCG/ACT Suspension, Nasal) Active. ProAir HFA (108 (90 Base)MCG/ACT Aerosol Soln, Inhalation) Active. Valsartan (80MG  Tablet, Oral) Active. Naproxen (500MG  Tablet, Oral) Active. Medications Reconciled  Social History  Alcohol use Occasional alcohol use. Caffeine use Carbonated beverages, Coffee, Tea. No drug use Tobacco use Never smoker.  Family History  Alcohol Abuse Father, Mother, Son. Cancer Father. Depression Mother. Diabetes Mellitus Sister.  Pregnancy / Birth History  Age at menarche 76 years. Age of menopause 29-50 Contraceptive History Oral contraceptives. Gravida 3 Irregular periods Maternal age 25-30 Para 1  Review of Systems General Not Present- Appetite Loss, Chills, Fatigue, Fever, Night Sweats, Weight Gain and Weight Loss. Skin Not Present-  Change in Wart/Mole, Dryness, Hives, Jaundice, New Lesions, Non-Healing Wounds, Rash and Ulcer. HEENT Present- Seasonal Allergies. Not Present- Earache, Hearing Loss, Hoarseness, Nose Bleed, Oral Ulcers, Ringing in the Ears, Sinus Pain, Sore Throat, Visual Disturbances, Wears glasses/contact lenses and Yellow Eyes. Respiratory Not Present- Bloody sputum, Chronic Cough, Difficulty Breathing, Snoring and Wheezing. Breast Not Present- Breast Mass, Breast Pain, Nipple Discharge and Skin Changes. Cardiovascular Not Present- Chest Pain, Difficulty Breathing Lying Down, Leg Cramps, Palpitations, Rapid Heart Rate, Shortness of Breath and Swelling of Extremities. Gastrointestinal Not Present- Abdominal Pain, Bloating, Bloody Stool, Change in Bowel Habits, Chronic diarrhea, Constipation, Difficulty Swallowing, Excessive gas, Gets full quickly at meals, Hemorrhoids, Indigestion, Nausea, Rectal Pain and Vomiting. Female Genitourinary Not Present- Frequency, Nocturia, Painful Urination, Pelvic Pain and Urgency. Musculoskeletal Not Present- Back Pain, Joint Pain, Joint Stiffness, Muscle Pain, Muscle Weakness and Swelling of Extremities. Neurological Not Present- Decreased Memory, Fainting, Headaches, Numbness, Seizures, Tingling, Tremor, Trouble walking and Weakness. Psychiatric Not Present- Anxiety, Bipolar, Change in Sleep Pattern, Depression, Fearful and Frequent crying. Endocrine Not Present- Cold Intolerance, Excessive Hunger, Hair Changes, Heat Intolerance, Hot flashes and New Diabetes. Hematology Not Present- Easy Bruising, Excessive bleeding, Gland problems, HIV and Persistent Infections.   Vitals 12/02/2014 9:26 AM Weight: 188 lb Height: 64in Body Surface Area: 1.96 m Body Mass Index: 32.27 kg/m Temp.: 97.7F(Temporal)  Pulse: 79 (Regular)  BP: 124/78 (Sitting, Left Arm, Standard)    Physical Exam   General - appears comfortable, no distress; not diaphorectic  HEENT -  normocephalic; sclerae clear, gaze conjugate; mucous membranes moist, dentition good; voice normal  Neck - symmetric on extension; no palpable anterior or posterior cervical adenopathy; no palpable masses in the thyroid bed  Chest - clear bilaterally with rhonchi, rales, or wheeze; port site right upper chest wall well-healed with no sign of seroma nor infection; no tenderness  Cor - regular rhythm with normal rate; no significant murmur  Ext - non-tender without significant edema or lymphedema  Neuro - grossly intact; no tremor    Assessment & Plan   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 Chemotherapy is now completed. The patient and her medical oncologist desire removal of her right subclavian vein infusion port. The patient and I discussed this procedure today. We will schedule her for outpatient surgery in the near future.  The risks and benefits of the procedure have been discussed at length with the patient. The patient understands the proposed procedure, potential alternative treatments, and the course of recovery to be expected. All of the patient's questions have been answered at this time. The patient wishes to proceed with surgery.  [name], MD, [facility] Office: [phone]

## 2015-01-02 ENCOUNTER — Ambulatory Visit (HOSPITAL_BASED_OUTPATIENT_CLINIC_OR_DEPARTMENT_OTHER)
Admission: RE | Admit: 2015-01-02 | Discharge: 2015-01-02 | Disposition: A | Discharge status: Home / Self Care | Payer: Medicare Other | Source: Ambulatory Visit | Attending: Surgery | Admitting: Surgery

## 2015-01-02 ENCOUNTER — Encounter (HOSPITAL_BASED_OUTPATIENT_CLINIC_OR_DEPARTMENT_OTHER)
Admission: RE | Disposition: A | Discharge status: Home / Self Care | Payer: Self-pay | Source: Ambulatory Visit | Attending: Surgery

## 2015-01-02 ENCOUNTER — Encounter (HOSPITAL_BASED_OUTPATIENT_CLINIC_OR_DEPARTMENT_OTHER): Payer: Self-pay | Admitting: Anesthesiology

## 2015-01-02 ENCOUNTER — Ambulatory Visit (HOSPITAL_BASED_OUTPATIENT_CLINIC_OR_DEPARTMENT_OTHER): Payer: Medicare Other | Admitting: Anesthesiology

## 2015-01-02 DIAGNOSIS — Z90710 Acquired absence of both cervix and uterus: Secondary | ICD-10-CM | POA: Insufficient documentation

## 2015-01-02 DIAGNOSIS — Z886 Allergy status to analgesic agent status: Secondary | ICD-10-CM | POA: Diagnosis not present

## 2015-01-02 DIAGNOSIS — C50919 Malignant neoplasm of unspecified site of unspecified female breast: Secondary | ICD-10-CM | POA: Diagnosis not present

## 2015-01-02 DIAGNOSIS — Z9013 Acquired absence of bilateral breasts and nipples: Secondary | ICD-10-CM | POA: Diagnosis not present

## 2015-01-02 DIAGNOSIS — Z853 Personal history of malignant neoplasm of breast: Secondary | ICD-10-CM

## 2015-01-02 HISTORY — PX: PORT-A-CATH REMOVAL: SHX5289

## 2015-01-02 SURGERY — REMOVAL PORT-A-CATH
Anesthesia: Monitor Anesthesia Care | Site: Chest | Laterality: Right

## 2015-01-02 MED ORDER — LIDOCAINE-EPINEPHRINE (PF) 1 %-1:200000 IJ SOLN
INTRAMUSCULAR | Status: AC
  Filled 2015-01-02: qty 10

## 2015-01-02 MED ORDER — IBUPROFEN 100 MG/5ML PO SUSP: 200.0000 mg | Freq: Four times a day (QID) | ORAL | Status: DC | PRN

## 2015-01-02 MED ORDER — KETOROLAC TROMETHAMINE 30 MG/ML IJ SOLN: 30.0000 mg | Freq: Once | INTRAMUSCULAR | Status: DC | PRN

## 2015-01-02 MED ORDER — LACTATED RINGERS IV SOLN
INTRAVENOUS | Status: DC
  Administered 2015-01-02: 13:00:00 via INTRAVENOUS

## 2015-01-02 MED ORDER — MIDAZOLAM HCL 2 MG/2ML IJ SOLN
1.0000 mg | INTRAMUSCULAR | Status: DC | PRN
  Administered 2015-01-02: 2 mg via INTRAVENOUS

## 2015-01-02 MED ORDER — MEPERIDINE HCL 25 MG/ML IJ SOLN: 6.2500 mg | INTRAMUSCULAR | Status: DC | PRN

## 2015-01-02 MED ORDER — FENTANYL CITRATE (PF) 100 MCG/2ML IJ SOLN
INTRAMUSCULAR | Status: AC
  Filled 2015-01-02: qty 2

## 2015-01-02 MED ORDER — FENTANYL CITRATE 0.05 MG/ML IJ SOLN
50.0000 ug | INTRAMUSCULAR | Status: DC | PRN
  Administered 2015-01-02: 100 ug via INTRAVENOUS

## 2015-01-02 MED ORDER — ONDANSETRON HCL 4 MG/2ML IJ SOLN
INTRAMUSCULAR | Status: DC | PRN
  Administered 2015-01-02: 4 mg via INTRAVENOUS

## 2015-01-02 MED ORDER — LIDOCAINE HCL (PF) 1 % IJ SOLN
INTRAMUSCULAR | Status: DC | PRN
  Administered 2015-01-02: 10 mL

## 2015-01-02 MED ORDER — CEFAZOLIN SODIUM-DEXTROSE 2-3 GM-% IV SOLR
2.0000 g | INTRAVENOUS | Status: AC
  Administered 2015-01-02: 2 g via INTRAVENOUS

## 2015-01-02 MED ORDER — IBUPROFEN 200 MG PO TABS: 200.0000 mg | ORAL_TABLET | Freq: Four times a day (QID) | ORAL | Status: DC | PRN

## 2015-01-02 MED ORDER — MIDAZOLAM HCL 2 MG/2ML IJ SOLN
INTRAMUSCULAR | Status: AC
  Filled 2015-01-02: qty 2

## 2015-01-02 MED ORDER — PROPOFOL 10 MG/ML IV BOLUS
INTRAVENOUS | Status: DC | PRN
  Administered 2015-01-02 (×2): 10 mg via INTRAVENOUS
  Administered 2015-01-02 (×2): 20 mg via INTRAVENOUS

## 2015-01-02 MED ORDER — LIDOCAINE HCL (PF) 1 % IJ SOLN
INTRAMUSCULAR | Status: AC
  Filled 2015-01-02: qty 30

## 2015-01-02 MED ORDER — HYDROMORPHONE HCL 1 MG/ML IJ SOLN: 0.2500 mg | INTRAMUSCULAR | Status: DC | PRN

## 2015-01-02 SURGICAL SUPPLY — 35 items
APL SKNCLS STERI-STRIP NONHPOA (GAUZE/BANDAGES/DRESSINGS)
BENZOIN TINCTURE PRP APPL 2/3 (GAUZE/BANDAGES/DRESSINGS) ×1 IMPLANT
BLADE SURG 15 STRL LF DISP TIS (BLADE) ×1 IMPLANT
BLADE SURG 15 STRL SS (BLADE) ×3
CHLORAPREP W/TINT 26ML (MISCELLANEOUS) ×3 IMPLANT
CLOSURE WOUND 1/2 X4 (GAUZE/BANDAGES/DRESSINGS)
COVER BACK TABLE 60X90IN (DRAPES) ×3 IMPLANT
COVER MAYO STAND STRL (DRAPES) ×3 IMPLANT
DECANTER SPIKE VIAL GLASS SM (MISCELLANEOUS) IMPLANT
DRAPE LAPAROTOMY 100X72 PEDS (DRAPES) ×3 IMPLANT
DRAPE UTILITY XL STRL (DRAPES) ×3 IMPLANT
ELECT REM PT RETURN 9FT ADLT (ELECTROSURGICAL) ×3
ELECTRODE REM PT RTRN 9FT ADLT (ELECTROSURGICAL) ×1 IMPLANT
GLOVE BIOGEL PI IND STRL 7.0 (GLOVE) IMPLANT
GLOVE BIOGEL PI INDICATOR 7.0 (GLOVE) ×2
GLOVE ECLIPSE 6.5 STRL STRAW (GLOVE) ×2 IMPLANT
GLOVE SURG ORTHO 8.0 STRL STRW (GLOVE) ×3 IMPLANT
GOWN STRL REUS W/ TWL LRG LVL3 (GOWN DISPOSABLE) ×1 IMPLANT
GOWN STRL REUS W/ TWL XL LVL3 (GOWN DISPOSABLE) ×1 IMPLANT
GOWN STRL REUS W/TWL LRG LVL3 (GOWN DISPOSABLE) ×3
GOWN STRL REUS W/TWL XL LVL3 (GOWN DISPOSABLE) ×3
LIQUID BAND (GAUZE/BANDAGES/DRESSINGS) ×2 IMPLANT
NDL HYPO 25X1 1.5 SAFETY (NEEDLE) IMPLANT
NEEDLE HYPO 25X1 1.5 SAFETY (NEEDLE) ×3 IMPLANT
PACK BASIN DAY SURGERY FS (CUSTOM PROCEDURE TRAY) ×3 IMPLANT
PENCIL BUTTON HOLSTER BLD 10FT (ELECTRODE) ×3 IMPLANT
SLEEVE SCD COMPRESS KNEE MED (MISCELLANEOUS) IMPLANT
SPONGE GAUZE 4X4 12PLY STER LF (GAUZE/BANDAGES/DRESSINGS) ×3 IMPLANT
STRIP CLOSURE SKIN 1/2X4 (GAUZE/BANDAGES/DRESSINGS) ×1 IMPLANT
SUT MNCRL AB 4-0 PS2 18 (SUTURE) ×3 IMPLANT
SUT VIC AB 3-0 SH 27 (SUTURE) ×3
SUT VIC AB 3-0 SH 27X BRD (SUTURE) ×1 IMPLANT
SYR CONTROL 10ML LL (SYRINGE) ×2 IMPLANT
TOWEL OR 17X24 6PK STRL BLUE (TOWEL DISPOSABLE) ×4 IMPLANT
TOWEL OR NON WOVEN STRL DISP B (DISPOSABLE) ×1 IMPLANT

## 2015-01-02 NOTE — Discharge Instructions (Signed)

## 2015-01-02 NOTE — Transfer of Care (Signed)
Immediate Anesthesia Transfer of Care Note  Patient: [name]  Procedure(s) Performed: Procedure(s): REMOVAL PORT-A-CATH (Right)  Patient Location: PACU  Anesthesia Type:MAC  Level of Consciousness: awake, alert  and oriented  Airway & Oxygen Therapy: Patient Spontanous Breathing  Post-op Assessment: Report given to RN and Post -op Vital signs reviewed and stable  Post vital signs: Reviewed and stable  Last Vitals:  Filed Vitals:   01/02/15 1213  BP: 100/59  Pulse: 66  Temp: 36.7 C  Resp: 16    Complications: No apparent anesthesia complications

## 2015-01-02 NOTE — Interval H&P Note (Signed)
History and Physical Interval Note:  01/02/2015 1:05 PM  [name]  has presented today for surgery, with the diagnosis of BREAST CANCER.  The various methods of treatment have been discussed with the patient and family. After consideration of risks, benefits and other options for treatment, the patient has consented to    Procedure(s): REMOVAL PORT-A-CATH (N/A) as a surgical intervention .    The patient's history has been reviewed, patient examined, no change in status, stable for surgery.  I have reviewed the patient's chart and labs.  Questions were answered to the patient's satisfaction.    [name], MD, [facility] Office: [place]

## 2015-01-02 NOTE — Anesthesia Postprocedure Evaluation (Signed)
  Anesthesia Post-op Note  Patient: [name]  Procedure(s) Performed: Procedure(s): REMOVAL PORT-A-CATH (Right)  Patient Location: PACU  Anesthesia Type: MAC   Level of Consciousness: awake, alert  and oriented  Airway and Oxygen Therapy: Patient Spontanous Breathing  Post-op Pain: none  Post-op Assessment: Post-op Vital signs reviewed  Post-op Vital Signs: Reviewed  Last Vitals:  Filed Vitals:   01/02/15 1415  BP: 113/56  Pulse: 58  Temp: 36.4 C  Resp: 16    Complications: No apparent anesthesia complications

## 2015-01-02 NOTE — Brief Op Note (Signed)
01/02/2015  1:44 PM  PATIENT:  [name]  69 y.o. female  PRE-OPERATIVE DIAGNOSIS:  BREAST CANCER  POST-OPERATIVE DIAGNOSIS:  BREAST CANCER  PROCEDURE:  Procedure(s): REMOVAL PORT-A-CATH (Right)  SURGEON:  Surgeon(s) and Role:    * Armandina Gemma, MD - Primary  ANESTHESIA:   IV sedation  EBL:  Total I/O In: 600 [I.V.:600] Out: -   BLOOD ADMINISTERED:none  DRAINS: none   LOCAL MEDICATIONS USED:  LIDOCAINE   SPECIMEN:  No Specimen  DISPOSITION OF SPECIMEN:  N/A  COUNTS:  YES  TOURNIQUET:  * No tourniquets in log *  DICTATION: .Other Dictation: Dictation Number [id number]  PLAN OF CARE: Discharge to home after PACU  PATIENT DISPOSITION:  PACU - hemodynamically stable.   Delay start of Pharmacological VTE agent (>24hrs) due to surgical blood loss or risk of bleeding: yes  [name], MD, [facility] Office: [phone]

## 2015-01-02 NOTE — Anesthesia Preprocedure Evaluation (Signed)
Anesthesia Evaluation  Patient identified by MRN, date of birth, ID band Patient awake    Reviewed: Allergy & Precautions, NPO status , Patient's Chart, lab work & pertinent test results  Airway Mallampati: I  TM Distance: >3 FB Neck ROM: Full    Dental  (+) Teeth Intact, Dental Advisory Given   Pulmonary  breath sounds clear to auscultation        Cardiovascular Rhythm:Regular Rate:Normal     Neuro/Psych    GI/Hepatic   Endo/Other    Renal/GU      Musculoskeletal   Abdominal   Peds  Hematology   Anesthesia Other Findings   Reproductive/Obstetrics                             Anesthesia Physical Anesthesia Plan  ASA: II  Anesthesia Plan: General   Post-op Pain Management:    Induction: Intravenous  Airway Management Planned: Simple Face Mask  Additional Equipment:   Intra-op Plan:   Post-operative Plan:   Informed Consent: I have reviewed the patients History and Physical, chart, labs and discussed the procedure including the risks, benefits and alternatives for the proposed anesthesia with the patient or authorized representative who has indicated his/her understanding and acceptance.   Dental advisory given  Plan Discussed with: CRNA, Anesthesiologist and Surgeon  Anesthesia Plan Comments:         Anesthesia Quick Evaluation

## 2015-01-03 ENCOUNTER — Encounter (HOSPITAL_BASED_OUTPATIENT_CLINIC_OR_DEPARTMENT_OTHER): Payer: Self-pay | Admitting: Surgery

## 2015-01-03 NOTE — Op Note (Signed)
[name], [name]                 ACCOUNT NO.:  [id number]  MEDICAL RECORD NO.:  [id number]  LOCATION:                                 FACILITY:  PHYSICIAN:  [name], MD      DATE OF BIRTH:  [birth year]  DATE OF PROCEDURE:  01/02/2015                              OPERATIVE REPORT   PREOPERATIVE DIAGNOSIS:  Personal history of breast carcinoma.  POSTOPERATIVE DIAGNOSIS:  Personal history of breast carcinoma.  PROCEDURE:  Removal of right subclavian infusion port.  SURGEON:  [name], MD, FACS  ANESTHESIA:  Local with intravenous sedation.  PREPARATION:  ChloraPrep.  COMPLICATIONS:  None.  BLOOD LOSS:  Minimal.  INDICATIONS:  The patient is a 69 year old female, who is completed chemotherapy.  She desires removal of her infusion port.  BODY OF REPORT:  Procedure was done in OR #3 at the [facility].  The patient was brought to the operating room, placed in supine position on the operating room table.  Following administration of intravenous sedation, the patient was prepped and draped in the usual aseptic fashion.  After ascertaining that an adequate level of sedation had been achieved, the skin was anesthetized with local anesthetic.  The previous surgical incision was reopened with a #15 blade.  Dissection was carried down to the port.  The fiber sheath was opened.  Port is elevated and suture material was divided and the port is removed in its entirety with the attached catheter.  Catheter tunnel is sutured, closed with a 3-0 Vicryl figure-of-eight suture.  Prolene suture is retrieved and removed from the wound.  The fiber sheath was cauterized with the electrocautery.  Subcutaneous tissues were closed with interrupted 3-0 Vicryl sutures.  Skin was closed with a running 4-0 Monocryl subcuticular suture.  Wound was washed and dried and topical glue was placed as dressing.  The patient was awakened from anesthesia and brought to the recovery room.   The patient tolerated the procedure well.   [name], MD, [facility] Office: [phone]    TMG/MEDQ  D:  01/02/2015  T:  01/03/2015  Job:  [id number]

## 2015-02-13 IMAGING — CR DG CHEST 2V
2 series · 2 of 2 positions shown · non-contrast
Comparison: None

CLINICAL DATA: Suspicious right ovarian mass

CHEST - 2 VIEW

[w chest pa]
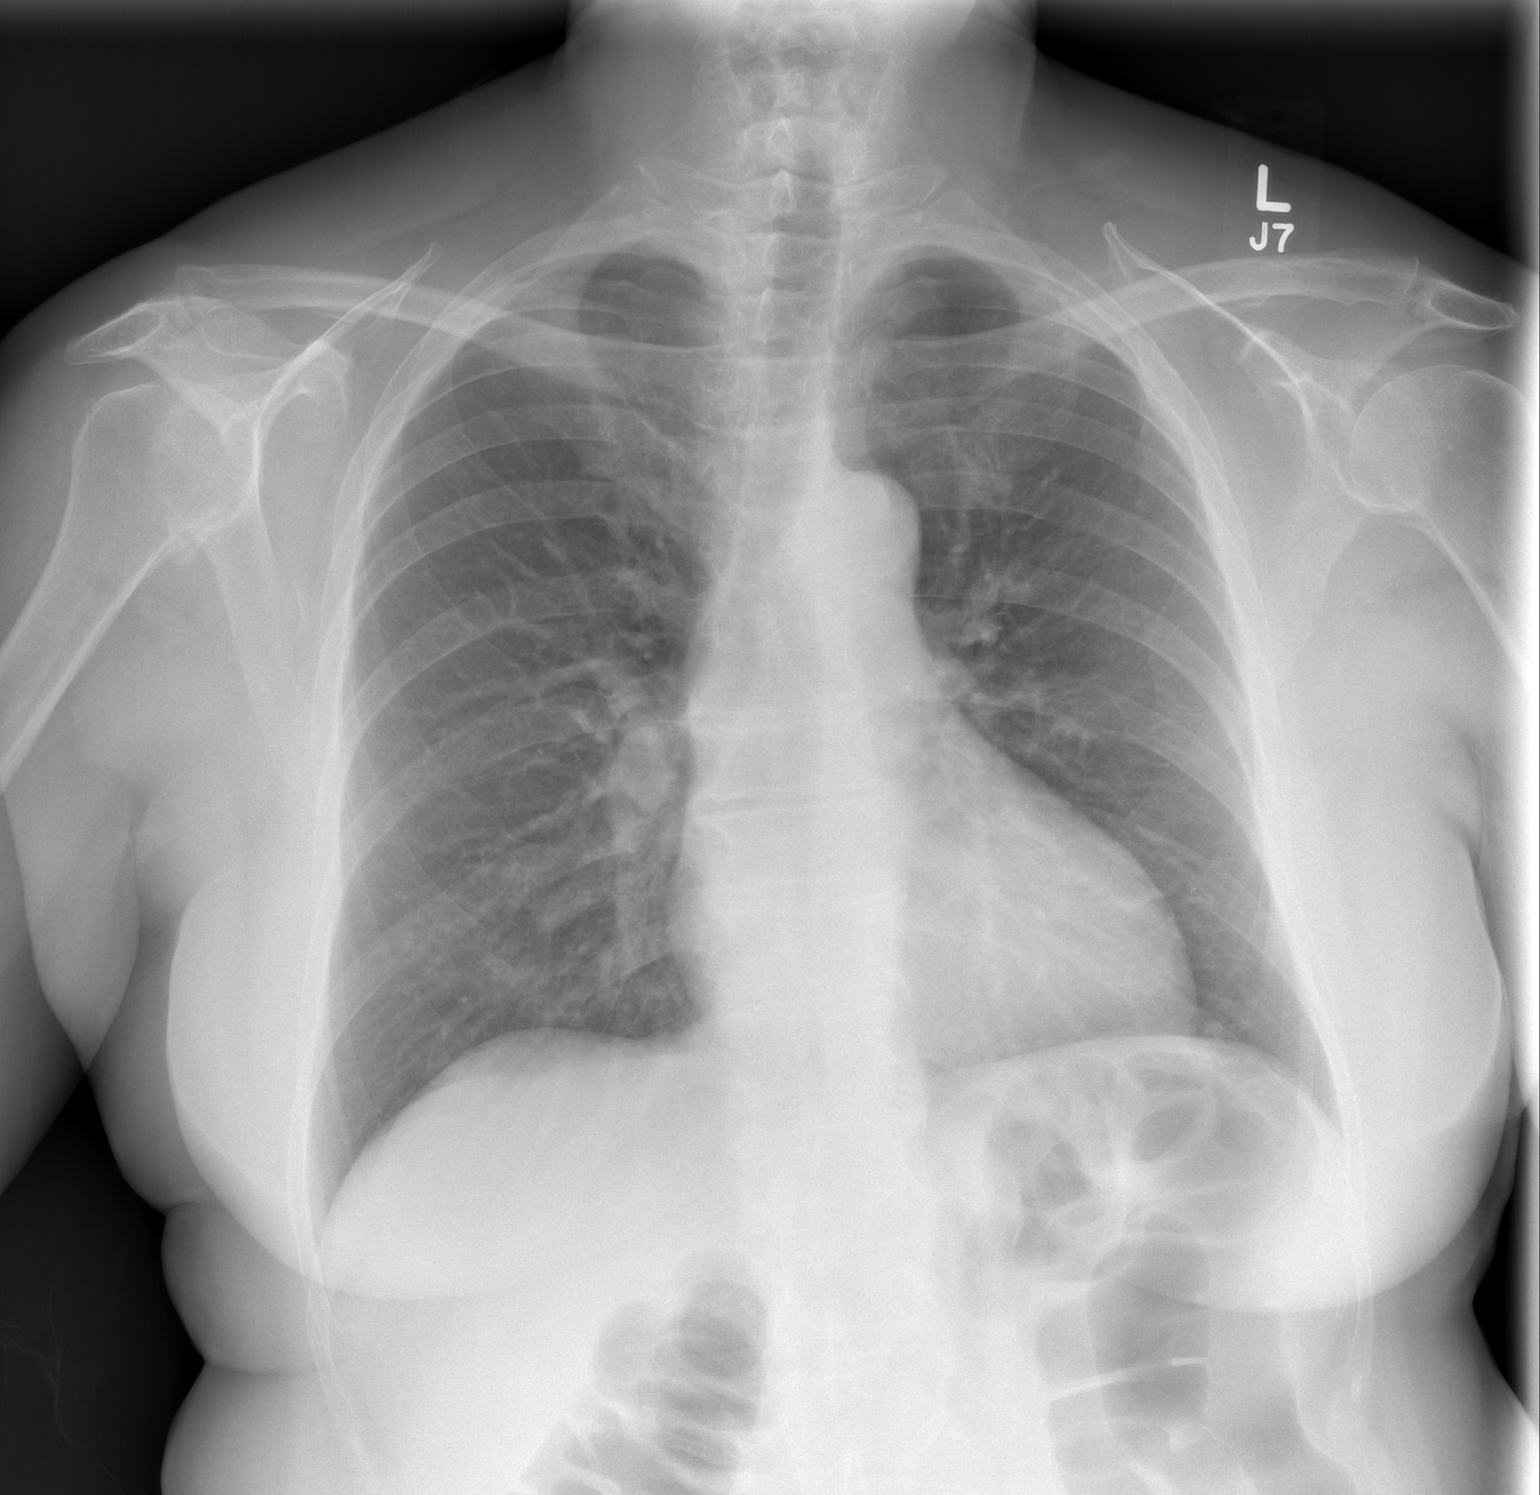

[w chest lat]
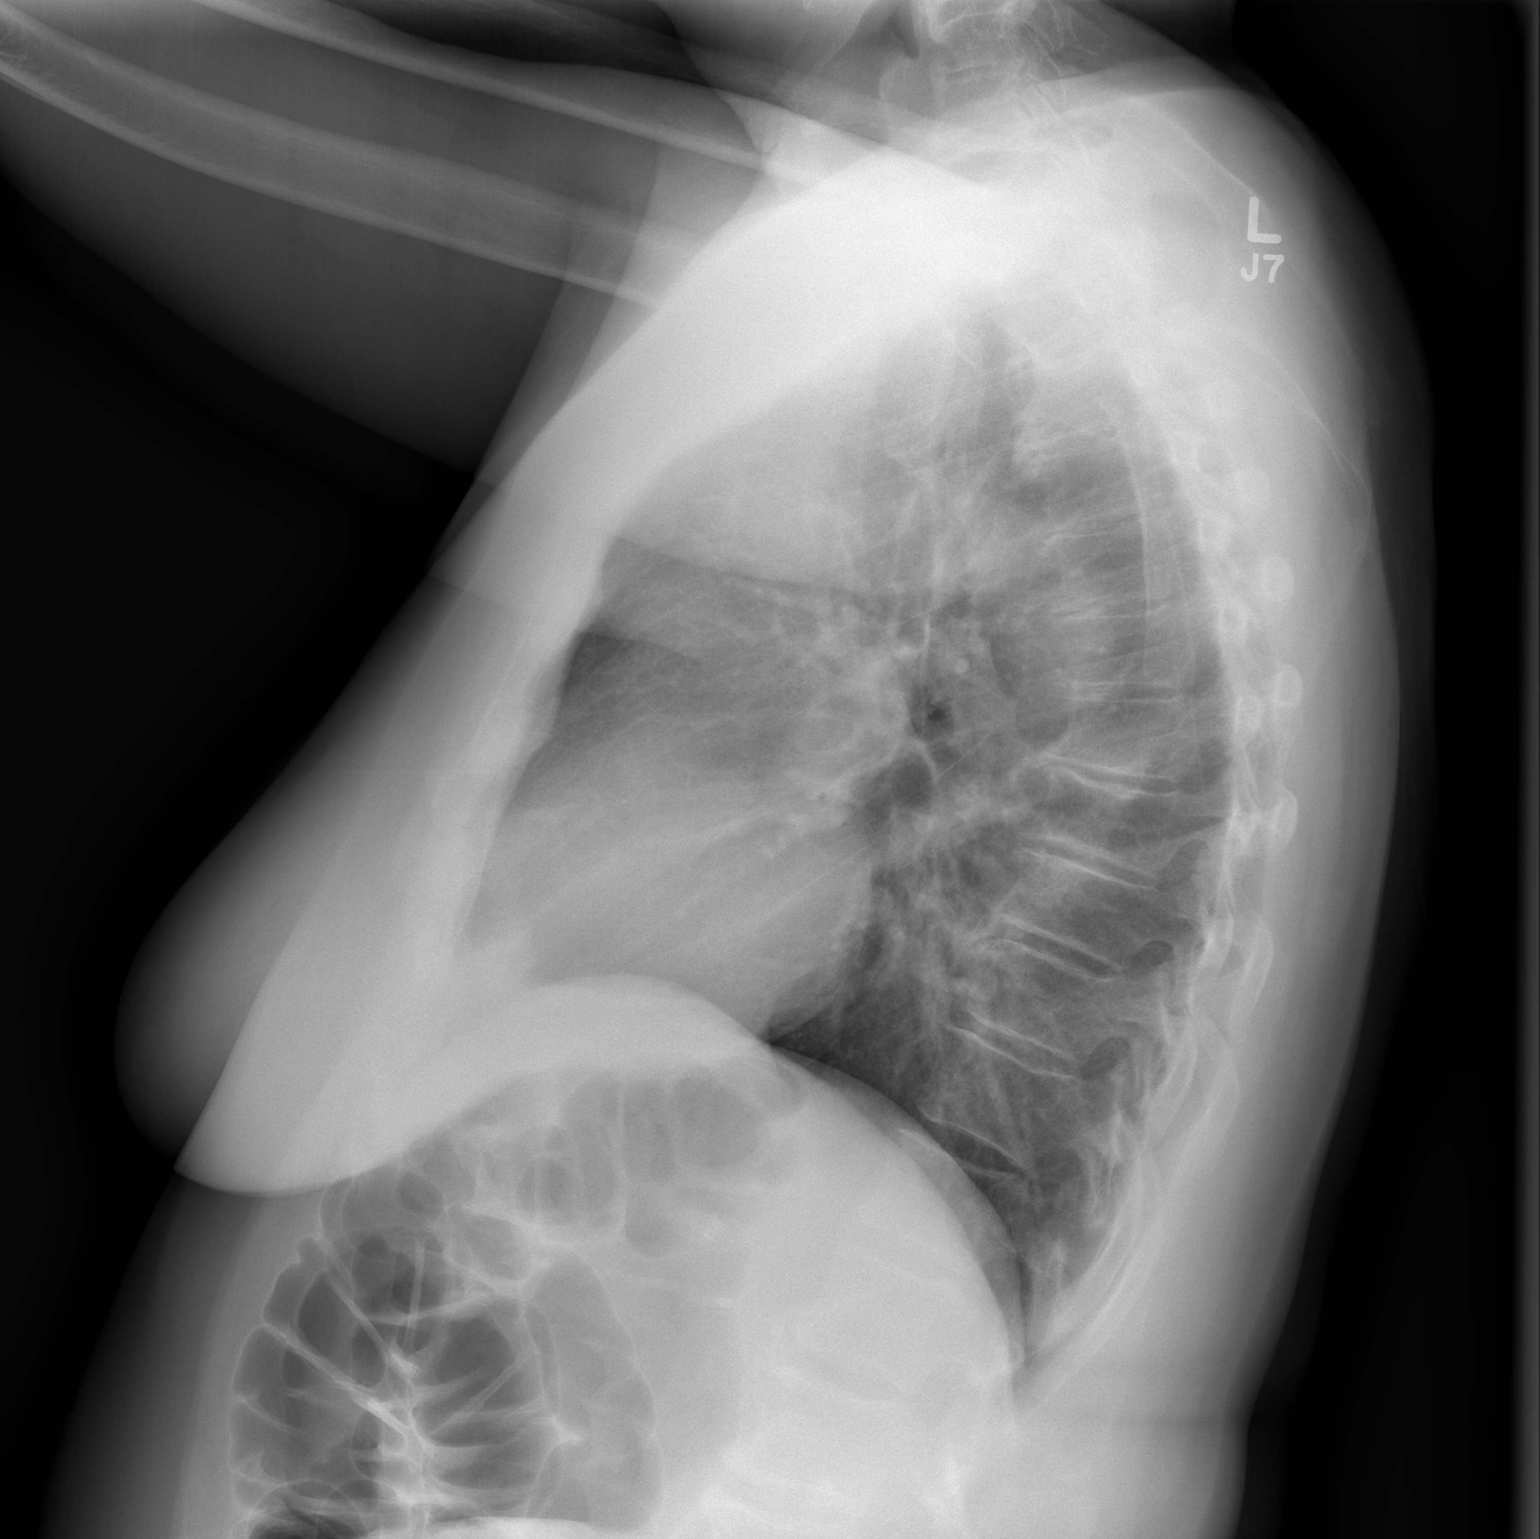

[2 of 2 positions shown; findings below may reference images not displayed]

FINDINGS: The heart size and mediastinal contours are within normal
limits. Osteoarthritis is noted within the right glenohumeral
joint.  Both lungs are clear.  The visualized skeletal structures
are unremarkable.
IMPRESSION: Negative examination.

## 2015-03-13 ENCOUNTER — Encounter: Payer: Self-pay | Admitting: Gastroenterology

## 2015-03-13 ENCOUNTER — Other Ambulatory Visit: Payer: Self-pay

## 2015-03-16 ENCOUNTER — Ambulatory Visit (INDEPENDENT_AMBULATORY_CARE_PROVIDER_SITE_OTHER): Payer: Medicare Other | Admitting: Podiatry

## 2015-03-16 ENCOUNTER — Encounter: Payer: Self-pay | Admitting: Podiatry

## 2015-03-16 ENCOUNTER — Ambulatory Visit (INDEPENDENT_AMBULATORY_CARE_PROVIDER_SITE_OTHER): Payer: Medicare Other

## 2015-03-16 VITALS — BP 118/70 | HR 84 | Resp 16

## 2015-03-16 DIAGNOSIS — M7661 Achilles tendinitis, right leg: Secondary | ICD-10-CM

## 2015-03-16 MED ORDER — MELOXICAM 15 MG PO TABS: 15.0000 mg | ORAL_TABLET | Freq: Every day | ORAL | Status: DC

## 2015-03-16 MED ORDER — METHYLPREDNISOLONE 4 MG PO TBPK: ORAL_TABLET | ORAL | Status: DC

## 2015-03-16 NOTE — Progress Notes (Signed)
   Subjective:    Patient ID: [name], female    DOB: [birth year], 69 y.o.   MRN: [id number]  HPI Comments: "I have pain in the heel"   Patient c/o aching posterior heel right for few months. She has AM pain. The area is slightly red. She has tried new shoes and did help.     Review of Systems  Musculoskeletal: Positive for arthralgias and gait problem.  Allergic/Immunologic: Positive for environmental allergies.  All other systems reviewed and are negative.      Objective:   Physical Exam: I have reviewed her past medical history medications allergies surgery social history and review of systems. Vital signs are stable she is alert and oriented 3. Pulses are intact bilateral. Neurologic sensorium is intact per Semmes-Weinstein monofilament and deep tendon reflexes are brisk and equal bilateral. Muscle strength +5 over 5 dorsiflexion plantar flexors inverters evertors and all intrinsic musculature is of the foot is intact. She has pain on palpation right Achilles tendon as it courses to the posterior lateral aspect of the right foot. The majority of her symptoms are located overlying the posterior and posterior lateral aspect of the right foot. Radiograph confirms a small retrocalcaneal heel spur of the thickening of the Achilles tendon as it inserts on the posterior aspect of the calcaneus. Cutaneous evaluation demonstrates supple well-hydrated cutis no erythema edema saline as drainage or odor.        Assessment & Plan:  Assessment: Achilles tendinitis right.  Plan: We discussed the etiology pathology conservative versus surgical therapies. We discussed appropriate shoe gear stretching exercises ice therapy shoe modifications. Injected a small amount of dexamethasone to the point of maximal tenderness making sure not to inject into the tendon itself. 2 mg of dexamethasone were utilized. We also placed her in a night splint and prescribed a Medrol Dosepak to be followed by  meloxicam. I will follow-up with her in 4-6 weeks.

## 2015-04-13 ENCOUNTER — Ambulatory Visit (INDEPENDENT_AMBULATORY_CARE_PROVIDER_SITE_OTHER): Payer: Medicare Other | Admitting: Podiatry

## 2015-04-13 ENCOUNTER — Encounter: Payer: Self-pay | Admitting: Podiatry

## 2015-04-13 VITALS — BP 100/55 | HR 92 | Resp 16

## 2015-04-13 DIAGNOSIS — M7661 Achilles tendinitis, right leg: Secondary | ICD-10-CM | POA: Diagnosis not present

## 2015-04-15 NOTE — Progress Notes (Signed)
She presents today for follow-up of Achilles tendinitis of the right foot. She continues INSERTED therapies and states that she is doing much better.  Objective: Vital signs are stable she is alert and oriented 3. Pulses are palpable. She has pain on palpation medial calcaneal tubercle as well as pain on palpation to the Achilles tendon right.  Assessment: Achilles tendinitis.  Plan: Reinjected a small amount of dexamethasone 2 mg to the point of maximal tenderness today but did not inject into the tendon itself. Follow-up with me in 1 month. Consider orthotics and/or MRI at that point

## 2015-05-11 ENCOUNTER — Ambulatory Visit: Payer: Medicare Other | Admitting: Podiatry

## 2015-08-31 ENCOUNTER — Other Ambulatory Visit: Payer: Self-pay | Admitting: Orthopaedic Surgery

## 2015-08-31 DIAGNOSIS — M25561 Pain in right knee: Secondary | ICD-10-CM

## 2015-09-06 ENCOUNTER — Ambulatory Visit
Admission: RE | Admit: 2015-09-06 | Discharge: 2015-09-06 | Disposition: A | Discharge status: Home / Self Care | Payer: Medicare Other | Source: Ambulatory Visit | Attending: Orthopaedic Surgery | Admitting: Orthopaedic Surgery

## 2015-09-06 DIAGNOSIS — M25561 Pain in right knee: Secondary | ICD-10-CM

## 2016-09-16 HISTORY — PX: COLONOSCOPY: SHX174

## 2016-10-29 ENCOUNTER — Ambulatory Visit (INDEPENDENT_AMBULATORY_CARE_PROVIDER_SITE_OTHER): Payer: Medicare Other | Admitting: Neurology

## 2016-10-29 ENCOUNTER — Encounter: Payer: Self-pay | Admitting: Neurology

## 2016-10-29 VITALS — BP 124/76 | HR 80 | Resp 20 | Ht 64.0 in | Wt 199.0 lb

## 2016-10-29 DIAGNOSIS — G479 Sleep disorder, unspecified: Secondary | ICD-10-CM

## 2016-10-29 DIAGNOSIS — R400 Somnolence: Secondary | ICD-10-CM | POA: Diagnosis not present

## 2016-10-29 DIAGNOSIS — R0683 Snoring: Secondary | ICD-10-CM

## 2016-10-29 DIAGNOSIS — R0681 Apnea, not elsewhere classified: Secondary | ICD-10-CM | POA: Diagnosis not present

## 2016-10-29 DIAGNOSIS — Z853 Personal history of malignant neoplasm of breast: Secondary | ICD-10-CM

## 2016-10-29 NOTE — Progress Notes (Addendum)
Subjective:    Patient ID: SHERREY SUBER is a 71 y.o. female.  HPI     Star Age, MD, PhD [facility] [address] [place], [place] [address]  Dear Dr. Brigitte Pulse,   I saw your patient, Adiella Laflash, upon your kind request in my neurologic clinic today for initial consultation of her sleep disorder, in particular, concern for underlying obstructive sleep apnea. The patient is unaccompanied today. As you know, Ms. Matulich is a 71 year old right-handed woman with an underlying medical history of left-sided breast cancer, status post bilateral mastectomy in July 2014, status post chemotherapy, osteopenia, CHF secondary to chemotherapy with subsequent improvement in EF, impaired fasting glucose, history of depression, vitamin D deficiency, and obesity, who reports snoring and excessive daytime somnolence. I reviewed your office note from 08/07/2016, which you kindly included. Her Epworth sleepiness score is 11 out of 24 today, her fatigue score is 38 out of 63. She is divorced for years. She lives alone. She is retired. She has 1 son and 2 grandchildren. She is a nonsmoker but was exposed to secondhand smoke, drinks alcohol occasionally, and drinks usually 1 cup of coffee per day and occasional diet soda. She was recently observed by her son to have loud snoring and apneic pauses while asleep when they went on a volleyball tournament. She has been told by friends when she shared a room that sleep was very interrupted and they got concerning due to her loud snoring. She denies nocturnal or morning headaches. 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 She denies night to night nocturia. Is typically in bed between 10 and 11 and wake up time is around 6:30, she likes to go for morning water walking exercises and also does yoga. She lives alone with one small dog that sleeps in  her queen size bed with her. She is a restless and light sleeper.    Her Past Medical History Is Significant For: Past Medical History:  Diagnosis Date  . Atrophic vaginitis   . breast cancer   . Breast lobular neoplasia    atypical  . CHF (congestive heart failure) ([place])   . Fibrocystic breast changes   . Heart murmur    grade 1  per patient -echo 7/14  . Hyperlipidemia   . Knee pain, bilateral   . Major depression in full remission ([place])   . Microcalcifications of the breast    atypical ductal hyperplasia with calcifications  . Osteopenia    Dr Toney Rakes  . Osteopenia   . PMB (postmenopausal bleeding)    Dr Toney Rakes  . Seasonal allergies   . Vitamin D deficiency   . Wears glasses     Her Past Surgical History Is Significant For: Past Surgical History:  Procedure Laterality Date  . ABDOMINAL HYSTERECTOMY N/A 03/02/2013   Procedure: TOTAL HYSTERECTOMY ABDOMINAL;  Surgeon: Imagene Gurney A. Alycia Rossetti, MD;  Location: [facility];  Service: Gynecology;  Laterality: N/A;  . AXILLARY LYMPH NODE BIOPSY Left 04/08/2013   Procedure: AXILLARY LYMPH NODE BIOPSY and axillary lymph node disection;  Surgeon: [name], MD;  Location: [place];  Service: General;  Laterality: Left;  nuclear medicine injection   . BREAST BIOPSY  01/09/2012   Procedure: BREAST BIOPSY WITH NEEDLE LOCALIZATION;  Surgeon: [name], MD;  Location: [facility];  Service: General;  Laterality: Right;  . CESAREAN  SECTION  1976  . COLONOSCOPY  [id number], [id number]   polyps 2013 only, Dr. Fuller Plan  . DILATION AND CURETTAGE OF UTERUS    . ENDOMETRIAL BIOPSY  2002   Uterine  polyps, Dr. Cherylann Banas  . HYSTEROSCOPY    . PORT-A-CATH REMOVAL Right 01/02/2015   Procedure: REMOVAL PORT-A-CATH;  Surgeon: Armandina Gemma, MD;  Location: [place];  Service: General;  Laterality: Right;  . PORTACATH PLACEMENT Right 05/04/2013   Procedure: INSERTION PORT-A-CATH;  Surgeon: [name], MD;   Location: [facility];  Service: General;  Laterality: Right;  . SALPINGOOPHORECTOMY Bilateral 03/02/2013   Procedure: BILATERAL SALPINGO OOPHORECTOMY;  Surgeon: Imagene Gurney A. Alycia Rossetti, MD;  Location: [facility];  Service: Gynecology;  Laterality: Bilateral;  . TOTAL MASTECTOMY Bilateral 04/08/2013   Procedure: TOTAL MASTECTOMY;  Surgeon: [name], MD;  Location: [place];  Service: General;  Laterality: Bilateral;  needs bed for over night stay    Her Family History Is Significant For: Family History  Problem Relation Age of Onset  . Alcohol abuse Mother   . Cancer Father     bone marrow cancer  . Alcohol abuse Father   . Von Willebrand disease Father   . Depression Son     Recovering alcoholic  . Depression Maternal Aunt   . Prostate cancer Maternal Uncle   . Diabetes Paternal Uncle   . Heart attack Paternal Uncle 74  . Cancer Paternal Grandmother     skin cancer  . Heart failure Paternal Grandmother     in 28s  . GER disease Paternal Grandmother     esophageal stricture  . GER disease Son   . Colon cancer Neg Hx   . Stomach cancer Neg Hx   . Stroke Neg Hx     Her Social History Is Significant For: Social History   Social History  . Marital status: Divorced    Spouse name: N/A  . Number of children: N/A  . Years of education: N/A   Social History Main Topics  . Smoking status: Never Smoker  . Smokeless tobacco: Never Used     Comment: second hand smoke for decades  . Alcohol use 1.2 oz/week    2 Glasses of wine per week     Comment: socially 1 - 2 glasses of wine per week  . Drug use: No  . Sexual activity: No   Other Topics Concern  . None   Social History Narrative  . None    Her Allergies Are:  Allergies  Allergen Reactions  . Codeine Rash    Post C section  . Oxycodone Itching  :   Her Current Medications Are:  Outpatient Encounter Prescriptions as of 10/29/2016  Medication Sig  . alendronate (FOSAMAX) 70 MG tablet Take  70 mg by mouth once a week. Take with a full glass of water on an empty stomach.  Marland Kitchen anastrozole (ARIMIDEX) 1 MG tablet Take 1 mg by mouth daily.  . Calcium 250 MG CAPS Take 1 capsule by mouth daily. 500 mg Calcium tab  . cholecalciferol (VITAMIN D) 1000 UNITS tablet Take 1,000 Units by mouth daily.  . cycloSPORINE (RESTASIS) 0.05 % ophthalmic emulsion Place 1 drop into both eyes 2 (two) times daily.  . DULoxetine (CYMBALTA) 60 MG capsule Take 60 mg by mouth daily.  . Evening Primrose Oil 1000 MG CAPS Take by mouth.  . fluticasone (FLONASE) 50 MCG/ACT nasal spray Place 2 sprays into the nose daily as  needed for rhinitis or allergies.  . Magnesium 500 MG TABS Take 250 mg by mouth 2 (two) times daily.   . meloxicam (MOBIC) 15 MG tablet Take 1 tablet (15 mg total) by mouth daily. (Patient taking differently: Take 7.5 mg by mouth daily. )  . OMEGA-3 FATTY ACIDS PO Take by mouth.  Vladimir Faster Glycol-Propyl Glycol (SYSTANE) 0.4-0.3 % SOLN Apply to eye. 2-3 x daily   . [DISCONTINUED] Cholecalciferol (VITAMIN D3) 2000 units TABS Take 2,000 Units by mouth.  . [DISCONTINUED] loratadine (CLARITIN) 10 MG tablet Take 10 mg by mouth daily.   No facility-administered encounter medications on file as of 10/29/2016.   :  Review of Systems:  Out of a complete 14 point review of systems, all are reviewed and negative with the exception of these symptoms as listed below: Review of Systems  Neurological: Positive for numbness.       Pt presents today to discuss her sleep. Pt does endorse snoring but has never had a sleep study.  Epworth Sleepiness Scale 0= would never doze 1= slight chance of dozing 2= moderate chance of dozing 3= high chance of dozing  Sitting and reading: 2 Watching TV: 2 Sitting inactive in a public place (ex. Theater or meeting): 2 As a passenger in a car for an hour without a break: 1 Lying down to rest in the afternoon: 2 Sitting and talking to someone: 0 Sitting quietly after  lunch (no alcohol): 2 In a car, while stopped in traffic: 0 Total: 11     Objective:  Neurologic Exam  Physical Exam Physical Examination:   Vitals:   10/29/16 0953  BP: 124/76  Pulse: 80  Resp: 20    General Examination: The patient is a very pleasant 71 y.o. female in no acute distress. She appears well-developed and well-nourished and very well groomed.   HEENT: Normocephalic, atraumatic, pupils are equal, round and reactive to light and accommodation. Mild bilateral cataracts are noted. Funduscopic exam is normal with sharp disc margins noted. Extraocular tracking is good without limitation to gaze excursion or nystagmus noted. Normal smooth pursuit is noted. Hearing is grossly intact. Face is symmetric with normal facial animation and normal facial sensation. Speech is clear with no dysarthria noted. There is no hypophonia. There is no lip, neck/head, jaw or voice tremor. Neck is supple with full range of passive and active motion. There are no carotid bruits on auscultation. Oropharynx exam reveals: mild mouth dryness, good dental hygiene and mild airway crowding, due to smaller airway and relatively longer appearing. Mallampati is class II. Tongue protrudes centrally and palate elevates symmetrically. Tonsils are 1+ in size. Neck size is 14 5/8 inches. She has a mild overbite.  Chest: Clear to auscultation without wheezing, rhonchi or crackles noted.  Heart: S1+S2+0, regular and normal without murmurs, rubs or gallops noted.   Abdomen: Soft, non-tender and non-distended with normal bowel sounds appreciated on auscultation.  Extremities: There is no pitting edema in the distal lower extremities bilaterally. Pedal pulses are intact.  Skin: Warm and dry without trophic changes noted. There are mild varicose veins.  Musculoskeletal: exam reveals no obvious joint deformities, tenderness or joint swelling or erythema.   Neurologically:  Mental status: The patient is awake, alert  and oriented in all 4 spheres. Her immediate and remote memory, attention, language skills and fund of knowledge are appropriate. There is no evidence of aphasia, agnosia, apraxia or anomia. Speech is clear with normal prosody and enunciation. Thought process is linear.  Mood is normal and affect is normal.  Cranial nerves II - XII are as described above under HEENT exam. In addition: shoulder shrug is normal with equal shoulder height noted. Motor exam: Normal bulk, strength and tone is noted. There is no drift, tremor or rebound. Romberg is negative. Reflexes are 2+ throughout. Babinski: Toes are flexor bilaterally. Fine motor skills and coordination: intact with normal finger taps, normal hand movements, normal rapid alternating patting, normal foot taps and normal foot agility.  Cerebellar testing: No dysmetria or intention tremor on finger to nose testing. Heel to shin is unremarkable bilaterally. There is no truncal or gait ataxia.  Sensory exam: intact to light touch, pinprick, vibration, temperature sense in the upper and lower extremities.  Gait, station and balance: She stands easily. No veering to one side is noted. No leaning to one side is noted. Posture is age-appropriate and stance is narrow based. Gait shows normal stride length and normal pace. No problems turning are noted. Tandem walk is unremarkable.         Assessment and Plan:  In summary, Kaileena G Rayner is a very pleasant 71 y.o.-year old female with an underlying medical history of left-sided breast cancer, status post bilateral mastectomies, s/p chemotherapy, osteopenia, CHF deemed secondary to chemotherapy (with subsequent improvement in EF), impaired fasting glucose, history of depression, vitamin D deficiency, and obesity, whose history and physical exam are concerning for obstructive sleep apnea (OSA). I had a long chat with the patient about my findings and the diagnosis of OSA, its prognosis and treatment options. We talked  about medical treatments, surgical interventions and non-pharmacological approaches. I explained in particular the risks and ramifications of untreated moderate to severe OSA, especially with respect to developing cardiovascular disease down the Road, including congestive heart failure, difficult to treat hypertension, cardiac arrhythmias, or stroke. Even type 2 diabetes has, in part, been linked to untreated OSA. Symptoms of untreated OSA include daytime sleepiness, memory problems, mood irritability and mood disorder such as depression and anxiety, lack of energy, as well as recurrent headaches, especially morning headaches. We talked about trying to maintain a healthy lifestyle in general, as well as the importance of weight control. I encouraged the patient to eat healthy, exercise daily and keep well hydrated, to keep a scheduled bedtime and wake time routine, to not skip any meals and eat healthy snacks in between meals. I advised the patient not to drive when feeling sleepy. I recommended the following at this time: sleep study with potential positive airway pressure titration. (We will score hypopneas at 4%).   I explained the sleep test procedure to the patient and also outlined possible surgical and non-surgical treatment options of OSA, including the use of a custom-made dental device (which would require a referral to a specialist dentist or oral surgeon), upper airway surgical options, such as pillar implants, radiofrequency surgery, tongue base surgery, and UPPP (which would involve a referral to an ENT surgeon). Rarely, jaw surgery such as mandibular advancement may be considered.  I also explained the CPAP treatment option to the patient, who indicated that she would be willing to try CPAP if the need arises. I explained the importance of being compliant with PAP treatment, not only for insurance purposes but primarily to improve Her symptoms, and for the patient's long term health benefit,  including to reduce Her cardiovascular risks. I answered all her questions today and the patient was in agreement. I would like to see her back after the sleep  study is completed and encouraged her to call with any interim questions, concerns, problems or updates.   Thank you very much for allowing me to participate in the care of this nice patient. If I can be of any further assistance to you please do not hesitate to call me at [phone].  Sincerely,   Star Age, MD, PhD

## 2016-10-29 NOTE — Patient Instructions (Signed)

## 2016-11-10 ENCOUNTER — Ambulatory Visit (INDEPENDENT_AMBULATORY_CARE_PROVIDER_SITE_OTHER): Payer: Medicare Other | Admitting: Neurology

## 2016-11-10 DIAGNOSIS — G4733 Obstructive sleep apnea (adult) (pediatric): Secondary | ICD-10-CM | POA: Diagnosis not present

## 2016-11-10 DIAGNOSIS — G4720 Circadian rhythm sleep disorder, unspecified type: Secondary | ICD-10-CM

## 2016-11-12 ENCOUNTER — Ambulatory Visit (INDEPENDENT_AMBULATORY_CARE_PROVIDER_SITE_OTHER): Payer: Medicare Other | Admitting: Neurology

## 2016-11-12 ENCOUNTER — Telehealth: Payer: Self-pay

## 2016-11-12 DIAGNOSIS — G4720 Circadian rhythm sleep disorder, unspecified type: Secondary | ICD-10-CM

## 2016-11-12 DIAGNOSIS — G4733 Obstructive sleep apnea (adult) (pediatric): Secondary | ICD-10-CM | POA: Diagnosis not present

## 2016-11-12 NOTE — Telephone Encounter (Signed)
I called pt. I advised her that her sleep study results. Pt is agreeable to coming in for a cpap titration study. I advised her that our sleep lab will be calling her to set up this study. Pt verbalized understanding of results. Pt had no questions at this time but was encouraged to call back if questions arise.

## 2016-11-12 NOTE — Addendum Note (Signed)
Addended by: Star Age on: 11/12/2016 08:27 AM   Modules accepted: Orders

## 2016-11-12 NOTE — Telephone Encounter (Signed)
-----   Message from Star Age, MD sent at 11/12/2016  8:27 AM EST ----- Patient referred by Dr. Brigitte Pulse, seen by me on 09/28/16, diagnostic PSG on 11/10/16.    Please call and notify the patient that the recent sleep study did confirm the diagnosis of overall mild OSA, notably worse during supine sleep, but since there was no REM/dream sleep during the study her obstructive sleep apnea and O2 nadir are likely worse when she goes into REM. I recommend treatment for her OSA in the form of CPAP. This will require a repeat sleep study for proper titration and mask fitting. Please explain to patient and arrange for a CPAP titration study. I have placed an order in the chart. Thanks, and please route to [facility] for scheduling next sleep study.  Star Age, MD, PhD [facility])

## 2016-11-12 NOTE — Progress Notes (Signed)
Patient referred by Dr. Brigitte Pulse, seen by me on 09/28/16, diagnostic PSG on 11/10/16.    Please call and notify the patient that the recent sleep study did confirm the diagnosis of overall mild OSA, notably worse during supine sleep, but since there was no REM/dream sleep during the study her obstructive sleep apnea and O2 nadir are likely worse when she goes into REM. I recommend treatment for her OSA in the form of CPAP. This will require a repeat sleep study for proper titration and mask fitting. Please explain to patient and arrange for a CPAP titration study. I have placed an order in the chart. Thanks, and please route to [facility] for scheduling next sleep study.  Star Age, MD, PhD [facility])

## 2016-11-12 NOTE — Procedures (Signed)
PATIENT'S NAME:  [name], [name] DOB:      01-May-1946      MR#:    [id number]     DATE OF RECORDING: 11/10/2016 REFERRING M.D.:  Marton Redwood, MD Study Performed:   Baseline Polysomnogram HISTORY: 71 year old woman with a history of breast cancer, status post mastectomy, status post chemotherapy, osteopenia, CHF deemed secondary to chemo with subsequent improvement in EF, impaired fasting glucose, history of depression, vitamin D deficiency, and obesity, who reports snoring and excessive daytime somnolence. Her Epworth sleepiness score is 11 out of 24, fatigue score is 38 out of 63. The patient's weight 199 pounds with a height of 64 (inches), resulting in a BMI of 33.9 kg/m2. The patient's neck circumference measured 14.6 inches.   CURRENT MEDICATIONS: Fosamax, Arimidex, Calcium, Vitamin D, Restasis, Cymbalta, Evening Primrose oil, Flonaise, Magnesium, Mobic, Omega 3   PROCEDURE:  This is a multichannel digital polysomnogram utilizing the Somnostar 11.2 system.  Electrodes and sensors were applied and monitored per AASM Specifications.   EEG, EOG, Chin and Limb EMG, were sampled at 200 Hz.  ECG, Snore and Nasal Pressure, Thermal Airflow, Respiratory Effort, CPAP Flow and Pressure, Oximetry was sampled at 50 Hz. Digital video and audio were recorded.      BASELINE STUDY  Lights Out was at 22:17 and Lights On at 04:23.  Total recording time (TRT) was 366 minutes, with a total sleep time (TST) of  268.5 minutes.   The patient's sleep latency was 32.5 minutes, which is delayed.  REM was absent.  The sleep efficiency was 73.4 %, which is reduced.     SLEEP ARCHITECTURE: WASO (Wake after sleep onset) was elevated and she was unable to go back to sleep after about 03:38 AM.  There were 48 minutes in Stage N1, 148 minutes Stage N2, 72.5 minutes Stage N3 and 0 minutes in Stage REM.  The percentage of Stage N1 was 17.9%, which is markedly increased, Stage N2 was 55.1%, which is normal, Stage N3 was 27.%, which  is increased, and Stage R (REM sleep) was absent.   The arousals were noted as: 22 were spontaneous, 0 were associated with PLMs, 22 were associated with respiratory events.  Audio and video analysis did not show any abnormal or unusual movements, behaviors, phonations or vocalizations.   The patient took 1 bathroom break. Intermittent mild to moderate snoring was noted. The EKG was in keeping with normal sinus rhythm (NSR).  RESPIRATORY ANALYSIS:  There were a total of 33 respiratory events:  1 obstructive apneas, 0 central apneas and 0 mixed apneas with a total of 1 apneas and an apnea index (AI) of .2 /hour. There were 32 hypopneas with a hypopnea index of 7.2 /hour. The patient also had 0 respiratory event related arousals (RERAs).      The total APNEA/HYPOPNEA INDEX (AHI) was 7.4/hour and the total RESPIRATORY DISTURBANCE INDEX was 7.4 /hour.  0 events occurred in REM sleep and 64 events in NREM. The REM AHI was 0 /hour, versus a non-REM AHI of 7.4. The patient spent 16 minutes of total sleep time in the supine position and 253 minutes in non-supine.. The supine AHI was 48.8 versus a non-supine AHI of 4.7.  OXYGEN SATURATION & C02:  The Wake baseline 02 saturation was 96%, with the lowest being 87%. Time spent below 89% saturation equaled 2 minutes.  PERIODIC LIMB MOVEMENTS:   The patient had a total of 0 Periodic Limb Movements.  The Periodic Limb Movement (PLM) index was  0 and the PLM Arousal index was 0/hour.  IMPRESSION: 1. Obstructive Sleep Apnea (OSA) 2. Dysfunctions associated with sleep stages or arousal from sleep  RECOMMENDATIONS: 1. This study demonstrates overall mild obstructive sleep apnea, severe by numbers in supine sleep. Please note, that the absence of REM sleep during this study likely underestimates her AHI and O2 nadir. Given the patient's medical history and sleep related complaints, a full-night CPAP titration study is recommended to optimize therapy for her OSA.  Other treatment options may include avoidance of supine sleep position along with weight loss, upper airway or jaw surgery in selected patients or the use of an oral appliance in certain patients. ENT evaluation and/or consultation with a maxillofacial surgeon or dentist may be feasible and some instances.    2. Please note that untreated obstructive sleep apnea carries additional perioperative morbidity. Patients with significant obstructive sleep apnea should receive perioperative PAP therapy and the surgeons and particularly the anesthesiologist should be informed of the diagnosis and the severity of the sleep disordered breathing. 3. This study shows sleep fragmentation and abnormal sleep stage percentages; these are nonspecific findings and per se do not signify an intrinsic sleep disorder or a cause for the patient's sleep-related symptoms. Causes include (but are not limited to) the first night effect of the sleep study, circadian rhythm disturbances, medication effect or an underlying mood disorder or medical problem.  4. The patient should be cautioned not to drive, work at heights, or operate dangerous or heavy equipment when tired or sleepy. Review and reiteration of good sleep hygiene measures should be pursued with any patient. 5. The patient will be seen in follow-up by Dr. Rexene Alberts at [facility] for discussion of the test results and further management strategies. The referring provider will be notified of the test results.  I certify that I have reviewed the entire raw data recording prior to the issuance of this report in accordance with the Standards of Accreditation of the American Academy of Sleep Medicine (AASM)    Star Age, MD, PhD Diplomat, American Board of Psychiatry and Neurology (Neurology and Sleep Medicine)

## 2016-11-13 ENCOUNTER — Telehealth: Payer: Self-pay

## 2016-11-13 NOTE — Addendum Note (Signed)
Addended by: Star Age on: 11/13/2016 09:04 AM   Modules accepted: Orders

## 2016-11-13 NOTE — Telephone Encounter (Signed)
-----   Message from Star Age, MD sent at 11/13/2016  9:04 AM EST ----- Patient referred by Dr. Brigitte Pulse, seen by me on 09/28/16, diagnostic PSG on 11/10/16, CPAP study on 11/12/16. Please call and inform patient that I have entered an order for treatment with positive airway pressure (PAP) treatment of obstructive sleep apnea (OSA). She did fairly well during the latest sleep study with CPAP, but had some sleep disruption and again no REM sleep. We will arrange for a machine for home use through a DME (durable medical equipment) company of Her choice; and I will see the patient back in follow-up in about 8-10 weeks. Please also explain to the patient that I will be looking out for compliance data, which can be downloaded from the machine (stored on an SD card, that is inserted in the machine) or via remote access through a modem, that is built into the machine. At the time of the followup appointment we will discuss sleep study results and how it is going with PAP treatment at home. Please advise patient to bring Her machine at the time of the first FU visit, even though this is cumbersome. Bringing the machine for every visit after that will likely not be needed, but often helps for the first visit to troubleshoot if needed. Please re-enforce the importance of compliance with treatment and the need for [place] to monitor compliance data - often an insurance requirement and actually good feedback for the patient as far as how they are doing.  Also remind patient, that any interim PAP machine or mask issues should be first addressed with the DME company, as they can often help better with technical and mask fit issues. Please ask if patient has a preference regarding DME company.  Please also make sure, the patient has a follow-up appointment with me in about 8-10 weeks from the setup date, thanks.  Once you have spoken to the patient - and faxed/routed report to PCP and referring MD (if other than PCP), you can close  this encounter, thanks,   Star Age, MD, PhD [facility] ([place])

## 2016-11-13 NOTE — Progress Notes (Signed)
Patient referred by Dr. Brigitte Pulse, seen by me on 09/28/16, diagnostic PSG on 11/10/16, CPAP study on 11/12/16. Please call and inform patient that I have entered an order for treatment with positive airway pressure (PAP) treatment of obstructive sleep apnea (OSA). She did fairly well during the latest sleep study with CPAP, but had some sleep disruption and again no REM sleep. We will arrange for a machine for home use through a DME (durable medical equipment) company of Her choice; and I will see the patient back in follow-up in about 8-10 weeks. Please also explain to the patient that I will be looking out for compliance data, which can be downloaded from the machine (stored on an SD card, that is inserted in the machine) or via remote access through a modem, that is built into the machine. At the time of the followup appointment we will discuss sleep study results and how it is going with PAP treatment at home. Please advise patient to bring Her machine at the time of the first FU visit, even though this is cumbersome. Bringing the machine for every visit after that will likely not be needed, but often helps for the first visit to troubleshoot if needed. Please re-enforce the importance of compliance with treatment and the need for [place] to monitor compliance data - often an insurance requirement and actually good feedback for the patient as far as how they are doing.  Also remind patient, that any interim PAP machine or mask issues should be first addressed with the DME company, as they can often help better with technical and mask fit issues. Please ask if patient has a preference regarding DME company.  Please also make sure, the patient has a follow-up appointment with me in about 8-10 weeks from the setup date, thanks.  Once you have spoken to the patient - and faxed/routed report to PCP and referring MD (if other than PCP), you can close this encounter, thanks,   Star Age, MD, PhD [facility] ([place])

## 2016-11-13 NOTE — Telephone Encounter (Signed)
I called pt. I advised her of her sleep study results. Pt is agreeable to starting a cpap. I advised her that I would send an order for her cpap to a DME, [facility], and they will call her to set up her cpap. I reviewed cpap compliance expectations with the pt. Pt is agreeable to a follow up on 01/29/2017 at 11:30am. Pt verbalized understanding of results. Pt had no questions at this time but was encouraged to call back if questions arise.

## 2016-11-13 NOTE — Telephone Encounter (Signed)
I called pt to discuss her sleep study results. No answer, left a message asking her to call me back. 

## 2016-11-13 NOTE — Procedures (Signed)
PATIENT'S NAME:  [name], [name] DOB:      June 01, 1946      MR#:    [id number]     DATE OF RECORDING: 11/12/2016 REFERRING M.D.:  Marton Redwood, MD Study Performed:   CPAP  Titration HISTORY:  71 year old right-handed woman with an underlying medical history of left-sided breast cancer, status post bilateral mastectomy in July 2014, status post chemotherapy, osteopenia, CHF secondary to chemotherapy with subsequent improvement in EF, impaired fasting glucose, history of depression, vitamin D deficiency, and obesity, who returns for a full night CPAP titration to treat her recently diagnosed OSA. Patient had baseline PSG on 11/10/16, which showed total AHI of 7.4/h, supine AHI of 48.8/h, absence of REM sleep, and O2 nadir of 87%. Epworth Sleepiness Scale at 11/24 points, BMI of 33.9 kg/m2. The patient's neck circumference measured 14 inches.  CURRENT MEDICATIONS: Fosamax, Arimidex, Calcium, Vitamin D, Restasis, Cymbalta, Evening Primrose oil, Flonaise, Magnesium, Mobic, Omega 3  PROCEDURE:  This is a multichannel digital polysomnogram utilizing the SomnoStar 11.2 system.  Electrodes and sensors were applied and monitored per AASM Specifications.   EEG, EOG, Chin and Limb EMG, were sampled at 200 Hz.  ECG, Snore and Nasal Pressure, Thermal Airflow, Respiratory Effort, CPAP Flow and Pressure, Oximetry was sampled at 50 Hz. Digital video and audio were recorded.      The patient was fitted with a medium P10 nasal pillows interface. CPAP was initiated at 5 cmH20 with heated humidity per AASM standards and pressure was advanced to 9 cmH20 because of hypopneas, apneas and desaturations.  At a PAP pressure of 8 cmH20, there was a reduction of the AHI to 1.9/hour with supine sleep achieved, but no REM sleep. O2 nadir of 91%.  Lights Out was at 21:04 and Lights On at 05:02. Total recording time (TRT) was 478.5 minutes, with a total sleep time (TST) of 222 minutes. The patient's sleep latency was 153 minutes. REM sleep  was absent. The sleep efficiency was 46.4 %, which is markedly reduced.    SLEEP ARCHITECTURE: WASO (Wake after sleep onset)  was 103.5 minutes with moderate sleep fragmentation noted .  There were 14 minutes in Stage N1, 208 minutes Stage N2, 0 minutes Stage N3 and 0 minutes in Stage REM.  The percentage of Stage N1 was 6.3%, Stage N2 was 93.7%, which is markedly increased, and Stage N3 and Stage R (REM sleep) were absent.  The arousals were noted as: 26 were spontaneous, 8 were associated with PLMs, 1 were associated with respiratory events.  Audio and video analysis did not show any abnormal or unusual movements, behaviors, phonations or vocalizations.  She was noted to be restless and had multiple position changes.  The patient took no bathroom breaks. The EKG was in keeping with normal sinus rhythm (NSR).   RESPIRATORY ANALYSIS:  There was a total of 1 respiratory events: 0 obstructive apneas, 0 central apneas and 0 mixed apneas with a total of 0 apneas and an apnea index (AI) of 0 /hour. There were 1 hypopneas with a hypopnea index of .3/hour. The patient also had 0 respiratory event related arousals (RERAs).      The total APNEA/HYPOPNEA INDEX  (AHI) was .3 /hour and the total RESPIRATORY DISTURBANCE INDEX was .3 .hour  0 events occurred in REM sleep and 1 events in NREM. The REM AHI was 0 /hour versus a non-REM AHI of .3 /hour.  The patient spent 60 minutes of total sleep time in the supine position and 162  minutes in non-supine. The supine AHI was 0.0, versus a non-supine AHI of 0.4.  OXYGEN SATURATION & C02:  The baseline 02 saturation was 94%, with the lowest being 75% (this was an error due to loss of signal, true nadir appeared to be 90%). Time spent below 89% saturation equaled 0 minutes.  PERIODIC LIMB MOVEMENTS:    The patient had a total of 45 Periodic Limb Movements. The Periodic Limb Movement (PLM) index was 12.2 and the PLM Arousal index was 2.2 /hour. Post-study, the patient  indicated that sleep was better than usual.   DIAGNOSIS 1. Obstructive Sleep Apnea (OSA) 2. Dysfunctions associated with sleep stages or arousal from sleep  PLANS/RECOMMENDATIONS: 1. This study demonstrates near resolution of the patient's obstructive sleep apnea with CPAP therapy, but absence of REM sleep. I will prescribe home CPAP treatment at a pressure of 8 cm via medium nasal pillows with heated humidity. The patient should be reminded to be fully compliant with PAP therapy to improve sleep related symptoms and decrease long term cardiovascular risks. The patient should be reminded, that it may take up to 3 months to get fully used to using PAP with all planned sleep. The earlier full compliance is achieved, the better long term compliance tends to be. Please note that untreated obstructive sleep apnea carries additional perioperative morbidity. Patients with significant obstructive sleep apnea should receive perioperative PAP therapy and the surgeons and particularly the anesthesiologist should be informed of the diagnosis and the severity of the sleep disordered breathing. 2. This study shows sleep fragmentation and abnormal sleep stage percentages; these are nonspecific findings and per se do not signify an intrinsic sleep disorder or a cause for the patient's sleep-related symptoms. Causes include (but are not limited to) the first night effect of the sleep study, circadian rhythm disturbances, medication effect or an underlying mood disorder or medical problem.  3. The patient should be cautioned not to drive, work at heights, or operate dangerous or heavy equipment when tired or sleepy. Review and reiteration of good sleep hygiene measures should be pursued with any patient. 4. The patient will be seen in follow-up by Dr. Rexene Alberts at [facility] for discussion of the test results and further management strategies. The referring provider will be notified of the test results.  I certify that I have  reviewed the entire raw data recording prior to the issuance of this report in accordance with the Standards of Accreditation of the American Academy of Sleep Medicine (AASM)    Star Age, MD, PhD Diplomat, American Board of Psychiatry and Neurology (Neurology and Sleep Medicine)

## 2016-12-31 ENCOUNTER — Encounter: Payer: Self-pay | Admitting: Gastroenterology

## 2017-01-20 ENCOUNTER — Encounter: Payer: Self-pay | Admitting: Gastroenterology

## 2017-01-29 ENCOUNTER — Encounter: Payer: Self-pay | Admitting: Neurology

## 2017-01-29 ENCOUNTER — Ambulatory Visit (INDEPENDENT_AMBULATORY_CARE_PROVIDER_SITE_OTHER): Payer: Medicare Other | Admitting: Neurology

## 2017-01-29 VITALS — BP 126/78 | HR 82 | Resp 16 | Ht 64.0 in | Wt 202.0 lb

## 2017-01-29 DIAGNOSIS — G4733 Obstructive sleep apnea (adult) (pediatric): Secondary | ICD-10-CM | POA: Diagnosis not present

## 2017-01-29 DIAGNOSIS — Z9989 Dependence on other enabling machines and devices: Secondary | ICD-10-CM

## 2017-01-29 NOTE — Patient Instructions (Signed)
Please continue using your CPAP regularly. While your insurance requires that you use CPAP at least 4 hours each night on 70% of the nights, I recommend, that you not skip any nights and use it throughout the night if you can. Getting used to CPAP and staying with the treatment long term does take time and patience and discipline. Untreated obstructive sleep apnea when it is moderate to severe can have an adverse impact on cardiovascular health and raise her risk for heart disease, arrhythmias, hypertension, congestive heart failure, stroke and diabetes. Untreated obstructive sleep apnea causes sleep disruption, nonrestorative sleep, and sleep deprivation. This can have an impact on your day to day functioning and cause daytime sleepiness and impairment of cognitive function, memory loss, mood disturbance, and problems focussing. Using CPAP regularly can improve these symptoms.  Keep up the good work! I will see you back in 6 months for sleep apnea check up, and if you continue to do well on CPAP I will see you once a year thereafter.   Please go ahead and change your nasal pillows and the filter. Please call [facility] for questions and supplies.

## 2017-01-29 NOTE — Progress Notes (Signed)
Subjective:    Patient ID: [name] is a 71 y.o. female.  HPI     Interim history:   Ms. Kutner is a 71 year old right-handed woman with an underlying medical history of left-sided breast cancer, status post bilateral mastectomy in July 2014, status post chemotherapy, osteopenia, CHF secondary to chemotherapy with subsequent improvement in EF, impaired fasting glucose, history of depression, vitamin D deficiency, and obesity, who presents for follow-up consultation of her sleep disorder, in particular her recent diagnosis of obstructive sleep apnea after sleep study testing. The patient is unaccompanied today. I first met her on 10/29/2016 at the request of her primary care physician, at which time she reported snoring, breathing pauses while asleep as witnessed by family and daytime somnolence. I invited her for sleep study testing. She had a baseline sleep study, followed by a CPAP titration study. I went over her test results with her in detail today. Her baseline sleep study from 11/10/2016 showed a sleep efficiency of 73.4%, sleep latency 32.5 minutes and REM sleep was absent. She had an increased percentage of stage I sleep, a mildly increased percentage of slow-wave sleep and absence of REM sleep. She had a total AHI of 7.4 per hour, supine AHI of 48.8 per hour, average oxygen saturation was 96%, nadir was 87%. She had no significant PLMS. The absence of REM sleep during the study likely underestimated her AHI and O2 nadir. Based on her test results and sleep related complaints I invited her for a full night CPAP titration study. She had this on 11/12/2016. Sleep efficiency was 46.4%, sleep latency prolonged at 153 minutes, REM sleep was absent. She was fitted with a medium nasal pillows interface and CPAP was titrated from 5 cm to 9 cm. On a pressure of 8 cm her AHI was 1.9 per hour, supine sleep was achieved but no REM sleep. O2 nadir was 91%. Based on her test results are prescribed CPAP  therapy for home use.  Today, 01/29/2017 (all dictated new, as well as above notes, some dictation done in note pad or Word, outside of chart, may appear as copied):   I reviewed her CPAP compliance data from 12/29/2016 through 01/27/2017 which is a total of 30 days, during which time she used her CPAP every night with percent used days greater than 4 hours at 93%, indicating excellent compliance with an average usage of 6 hours and 42 minutes, residual AHI borderline at 6.1 per hour, leak on the higher side for the 95th percentile at 18 L/m on a pressure of 8 cm with EPR. She reports doing better, sleep is better, daytime energy better, more restful sleep and more consolidated sleep, no RLS Sx. Has not changed with filter or nasal pillows yet, may explain the increase in leak notable in the second part of this last 30 day download. She feels she has adapted well to treatment, even reports dreaming. Overall is quite pleased with how she is doing.   The patient's allergies, current medications, family history, past medical history, past social history, past surgical history and problem list were reviewed and updated as appropriate.   Previously (copied from previous notes for reference):   10/29/2016: She reports snoring and excessive daytime somnolence. I reviewed your office note from 08/07/2016, which you kindly included. Her Epworth sleepiness score is 11 out of 24 today, her fatigue score is 38 out of 63. She is divorced for years. She lives alone. She is retired. She has 1 son and 2  grandchildren. She is a nonsmoker but was exposed to secondhand smoke, drinks alcohol occasionally, and drinks usually 1 cup of coffee per day and occasional diet soda. She was recently observed by her son to have loud snoring and apneic pauses while asleep when they went on a volleyball tournament. She has been told by friends when she shared a room that sleep was very interrupted and they got concerning due to her loud  snoring. She denies nocturnal or morning headaches. 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 She denies night to night nocturia. Is typically in bed between 10 and 11 and wake up time is around 6:30, she likes to go for morning water walking exercises and also does yoga. She lives alone with one small dog that sleeps in her queen size bed with her. She is a restless and light sleeper.    Her Past Medical History Is Significant For: Past Medical History:  Diagnosis Date  . Atrophic vaginitis   . breast cancer   . Breast lobular neoplasia    atypical  . CHF (congestive heart failure) ([place])   . Fibrocystic breast changes   . Heart murmur    grade 1  per patient -echo 7/14  . Hyperlipidemia   . Knee pain, bilateral   . Major depression in full remission ([place])   . Microcalcifications of the breast    atypical ductal hyperplasia with calcifications  . Osteopenia    Dr Toney Rakes  . Osteopenia   . PMB (postmenopausal bleeding)    Dr Toney Rakes  . Seasonal allergies   . Vitamin D deficiency   . Wears glasses     Her Past Surgical History Is Significant For: Past Surgical History:  Procedure Laterality Date  . ABDOMINAL HYSTERECTOMY N/A 03/02/2013   Procedure: TOTAL HYSTERECTOMY ABDOMINAL;  Surgeon: Imagene Gurney A. Alycia Rossetti, MD;  Location: [facility];  Service: Gynecology;  Laterality: N/A;  . AXILLARY LYMPH NODE BIOPSY Left 04/08/2013   Procedure: AXILLARY LYMPH NODE BIOPSY and axillary lymph node disection;  Surgeon: [name], MD;  Location: [facility];  Service: General;  Laterality: Left;  nuclear medicine injection   . BREAST BIOPSY  01/09/2012   Procedure: BREAST BIOPSY WITH NEEDLE LOCALIZATION;  Surgeon: [name], MD;  Location: [facility];  Service: General;  Laterality: Right;  . CESAREAN SECTION  1976  . COLONOSCOPY  [id number], [id number]   polyps 2013 only, Dr.  Fuller Plan  . DILATION AND CURETTAGE OF UTERUS    . ENDOMETRIAL BIOPSY  2002   Uterine  polyps, Dr. Cherylann Banas  . HYSTEROSCOPY    . PORT-A-CATH REMOVAL Right 01/02/2015   Procedure: REMOVAL PORT-A-CATH;  Surgeon: Armandina Gemma, MD;  Location: [facility];  Service: General;  Laterality: Right;  . PORTACATH PLACEMENT Right 05/04/2013   Procedure: INSERTION PORT-A-CATH;  Surgeon: [name], MD;  Location: [facility];  Service: General;  Laterality: Right;  . SALPINGOOPHORECTOMY Bilateral 03/02/2013   Procedure: BILATERAL SALPINGO OOPHORECTOMY;  Surgeon: Imagene Gurney A. Alycia Rossetti, MD;  Location: [facility];  Service: Gynecology;  Laterality: Bilateral;  . TOTAL MASTECTOMY Bilateral 04/08/2013   Procedure: TOTAL MASTECTOMY;  Surgeon: [name], MD;  Location: [facility];  Service: General;  Laterality: Bilateral;  needs bed for over night stay    Her Family History Is Significant For: Family History  Problem Relation Age of Onset  .  Alcohol abuse Mother   . Cancer Father        bone marrow cancer  . Alcohol abuse Father   . Von Willebrand disease Father   . Depression Son        Recovering alcoholic  . Depression Maternal Aunt   . Prostate cancer Maternal Uncle   . Diabetes Paternal Uncle   . Heart attack Paternal Uncle 66  . Cancer Paternal Grandmother        skin cancer  . Heart failure Paternal Grandmother        in 33s  . GER disease Paternal Grandmother        esophageal stricture  . GER disease Son   . Colon cancer Neg Hx   . Stomach cancer Neg Hx   . Stroke Neg Hx     Her Social History Is Significant For: Social History   Social History  . Marital status: Divorced    Spouse name: N/A  . Number of children: N/A  . Years of education: N/A   Social History Main Topics  . Smoking status: Never Smoker  . Smokeless tobacco: Never Used     Comment: second hand smoke for decades  . Alcohol use 1.2 oz/week    2 Glasses of wine per week      Comment: socially 1 - 2 glasses of wine per week  . Drug use: No  . Sexual activity: No   Other Topics Concern  . None   Social History Narrative  . None    Her Allergies Are:  Allergies  Allergen Reactions  . Codeine Rash    Post C section  . Oxycodone Itching  :   Her Current Medications Are:  Outpatient Encounter Prescriptions as of 01/29/2017  Medication Sig  . alendronate (FOSAMAX) 70 MG tablet Take 70 mg by mouth once a week. Take with a full glass of water on an empty stomach.  Marland Kitchen anastrozole (ARIMIDEX) 1 MG tablet Take 1 mg by mouth daily.  . Calcium 250 MG CAPS Take 1 capsule by mouth daily. 500 mg Calcium tab  . cholecalciferol (VITAMIN D) 1000 UNITS tablet Take 1,000 Units by mouth daily.  . cycloSPORINE (RESTASIS) 0.05 % ophthalmic emulsion Place 1 drop into both eyes 2 (two) times daily.  . DULoxetine (CYMBALTA) 60 MG capsule Take 60 mg by mouth daily.  . Evening Primrose Oil 1000 MG CAPS Take by mouth.  . fluticasone (FLONASE) 50 MCG/ACT nasal spray Place 2 sprays into the nose daily as needed for rhinitis or allergies.  . Magnesium 500 MG TABS Take 250 mg by mouth 2 (two) times daily.   . meloxicam (MOBIC) 15 MG tablet Take 1 tablet (15 mg total) by mouth daily. (Patient taking differently: Take 7.5 mg by mouth daily. )  . OMEGA-3 FATTY ACIDS PO Take by mouth.  Vladimir Faster Glycol-Propyl Glycol (SYSTANE) 0.4-0.3 % SOLN Apply to eye. 2-3 x daily    No facility-administered encounter medications on file as of 01/29/2017.   :  Review of Systems:  Out of a complete 14 point review of systems, all are reviewed and negative with the exception of these symptoms as listed below:  Review of Systems  Neurological:       Patient states that she is doing well with her CPAP. No new concerns.     Objective:  Neurologic Exam  Physical Exam Physical Examination:   Vitals:   01/29/17 1106  BP: 126/78  Pulse: 82  Resp: 16  General Examination: The patient is a  very pleasant 71 y.o. female in no acute distress. She appears well-developed and well-nourished and well groomed. Good spirits.   HEENT: Normocephalic, atraumatic, pupils are equal, round and reactive to light and accommodation. Mild bilateral cataracts are noted. Extraocular tracking is good without limitation to gaze excursion or nystagmus noted. Normal smooth pursuit is noted. Hearing is grossly intact. Face is symmetric with normal facial animation and normal facial sensation. Speech is clear with no dysarthria noted. There is no hypophonia. There is no lip, neck/head, jaw or voice tremor. Neck is supple with full range of passive and active motion. There are no carotid bruits on auscultation. Oropharynx exam reveals: mild mouth dryness, good dental hygiene and mild airway crowding. Mallampati is class II. Tongue protrudes centrally and palate elevates symmetrically. Tonsils are 1+ in size.   Chest: Clear to auscultation without wheezing, rhonchi or crackles noted.  Heart: S1+S2+0, regular and normal without murmurs, rubs or gallops noted.   Abdomen: Soft, non-tender and non-distended with normal bowel sounds appreciated on auscultation.  Extremities: There is no pitting edema in the distal lower extremities bilaterally. Pedal pulses are intact.  Skin: Warm and dry without trophic changes noted. There are mild varicose veins.  Musculoskeletal: exam reveals no obvious joint deformities, tenderness or joint swelling or erythema.   Neurologically:  Mental status: The patient is awake, alert and oriented in all 4 spheres. Her immediate and remote memory, attention, language skills and fund of knowledge are appropriate. There is no evidence of aphasia, agnosia, apraxia or anomia. Speech is clear with normal prosody and enunciation. Thought process is linear. Mood is normal and affect is normal.  Cranial nerves II - XII are as described above under HEENT exam. Motor exam: Normal bulk,  strength and tone is noted. There is no drift, resting tremor. Romberg is negative. Reflexes are 1-2+ throughout. Fine motor skills and coordination: grossly intact.  Cerebellar testing: No dysmetria or intention tremor. There is no truncal or gait ataxia.  Sensory exam: intact to light touch the upper and lower extremities.  Gait, station and balance: She stands easily. No veering to one side is noted. No leaning to one side is noted. Posture is age-appropriate and stance is narrow based. Gait shows normal stride length and normal pace. No problems turning are noted. Tandem walk is unremarkable.         Assessment and Plan:  In summary, [name] is a very pleasant 71 year old female with an underlying medical history of left-sided breast cancer, status post bilateral mastectomies, s/p chemotherapy, osteopenia, CHF deemed secondary to chemotherapy (with subsequent improvement in EF), impaired fasting glucose, history of depression, vitamin D deficiency, and obesity, whoPresents for follow-up consultation of her obstructive sleep apnea, after recent sleep study testing. She had a baseline sleep study on 11/10/2016 which showed overall mild sleep apnea but severe supine sleep apnea and absence of REM sleep with likely underestimation of her overall AHI and O2 nadir. She had a CPAP titration study on 11/12/2016 and did well with CPAP of 8 cm. She is fully compliant with treatment for which she is commended. She endorses improvement of her sleep quality, sleep consolidation, daytime energy. She has started walking on a regular basis because she feels she has more energy during the day to pursue physical activity. Her AHI is borderline at 6.1 per hour, her leak has increased in the past couple weeks. She is due for change of her nasal pillows  at this time. She is also reminded to change the filter and the machine. We mutually agreed to keep her pressure the same as she has adapted well to the current  settings. She is commended for her treatment adherence and her weight loss endeavors. I suggested a 6 month follow-up, sooner as needed. We reviewed her sleep study results as well as her compliance data together. Her physical exam is stable. Weight is a little increased.  I answered all her questions today and she was in agreement with the plan. I spent 25 minutes in total face-to-face time with the patient, more than 50% of which was spent in counseling and coordination of care, reviewing test results, reviewing medication and discussing or reviewing the diagnosis of OSA, its prognosis and treatment options. Pertinent laboratory and imaging test results that were available during this visit with the patient were reviewed by me and considered in my medical decision making (see chart for details).

## 2017-03-11 ENCOUNTER — Ambulatory Visit (AMBULATORY_SURGERY_CENTER): Payer: Self-pay | Admitting: *Deleted

## 2017-03-11 ENCOUNTER — Encounter: Payer: Self-pay | Admitting: Gastroenterology

## 2017-03-11 VITALS — Ht 64.0 in | Wt 203.6 lb

## 2017-03-11 DIAGNOSIS — Z86010 Personal history of colonic polyps: Secondary | ICD-10-CM

## 2017-03-11 MED ORDER — NA SULFATE-K SULFATE-MG SULF 17.5-3.13-1.6 GM/177ML PO SOLN: ORAL | 0 refills | Status: DC

## 2017-03-11 NOTE — Progress Notes (Signed)
No allergies to eggs or soy. No problems with anesthesia.  Pt given Emmi instructions for colonoscopy  No oxygen use  No diet drug use  

## 2017-03-25 ENCOUNTER — Encounter: Payer: Self-pay | Admitting: Gastroenterology

## 2017-03-25 ENCOUNTER — Ambulatory Visit (AMBULATORY_SURGERY_CENTER): Payer: Medicare Other | Admitting: Gastroenterology

## 2017-03-25 VITALS — BP 135/74 | HR 60 | Temp 97.5°F | Resp 10 | Ht 64.0 in | Wt 203.0 lb

## 2017-03-25 DIAGNOSIS — D123 Benign neoplasm of transverse colon: Secondary | ICD-10-CM

## 2017-03-25 DIAGNOSIS — K635 Polyp of colon: Secondary | ICD-10-CM

## 2017-03-25 DIAGNOSIS — Z86010 Personal history of colonic polyps: Secondary | ICD-10-CM | POA: Diagnosis not present

## 2017-03-25 DIAGNOSIS — D126 Benign neoplasm of colon, unspecified: Secondary | ICD-10-CM | POA: Diagnosis not present

## 2017-03-25 DIAGNOSIS — D124 Benign neoplasm of descending colon: Secondary | ICD-10-CM

## 2017-03-25 MED ORDER — FLEET ENEMA 7-19 GM/118ML RE ENEM
1.0000 | ENEMA | Freq: Once | RECTAL | Status: AC
  Administered 2017-03-25: 1 via RECTAL

## 2017-03-25 MED ORDER — SODIUM CHLORIDE 0.9 % IV SOLN: 500.0000 mL | INTRAVENOUS | Status: DC

## 2017-03-25 NOTE — Op Note (Signed)
[facility] Patient Name: Brihany Butch Procedure Date: 03/25/2017 8:04 AM MRN: [id number] Endoscopist: Ladene Artist , MD Age: 71 Referring MD:  Date of Birth: 14-Sep-1946 Gender: Female Account #: [id number] Procedure:                Colonoscopy Indications:              Surveillance: Personal history of adenomatous                            polyps on last colonoscopy 5 years ago Medicines:                Monitored Anesthesia Care Procedure:                Pre-Anesthesia Assessment:                           - Prior to the procedure, a History and Physical                            was performed, and patient medications and                            allergies were reviewed. The patient's tolerance of                            previous anesthesia was also reviewed. The risks                            and benefits of the procedure and the sedation                            options and risks were discussed with the patient.                            All questions were answered, and informed consent                            was obtained. Prior Anticoagulants: The patient has                            taken no previous anticoagulant or antiplatelet                            agents. ASA Grade Assessment: II - A patient with                            mild systemic disease. After reviewing the risks                            and benefits, the patient was deemed in                            satisfactory condition to undergo the procedure.  After obtaining informed consent, the colonoscope                            was passed under direct vision. Throughout the                            procedure, the patient's blood pressure, pulse, and                            oxygen saturations were monitored continuously. The                            Model PCF-[id number]) scope was introduced                            through the anus and  advanced to the the cecum,                            identified by appendiceal orifice and ileocecal                            valve. The ileocecal valve, appendiceal orifice,                            and rectum were photographed. The quality of the                            bowel preparation was adequate after extensive                            lavage and suctioning. The colonoscopy was                            performed without difficulty. The patient tolerated                            the procedure well. Scope In: 8:10:46 AM Scope Out: 8:30:47 AM Scope Withdrawal Time: 0 hours 16 minutes 5 seconds  Total Procedure Duration: 0 hours 20 minutes 1 second  Findings:                 The perianal and digital rectal examinations were                            normal.                           Four sessile polyps were found in the descending                            colon (3) and transverse colon (1). The polyps were                            6 to 7 mm in size. These polyps were removed with a  cold snare. Resection and retrieval were complete.                           Internal hemorrhoids were found during                            retroflexion. The hemorrhoids were small and Grade                            I (internal hemorrhoids that do not prolapse).                           The exam was otherwise without abnormality on                            direct and retroflexion views. Complications:            No immediate complications. Estimated blood loss:                            None. Estimated Blood Loss:     Estimated blood loss: none. Impression:               - Four 6 to 7 mm polyps in the descending colon and                            in the transverse colon, removed with a cold snare.                            Resected and retrieved.                           - Internal hemorrhoids.                           - The examination was  otherwise normal on direct                            and retroflexion views. Recommendation:           - Repeat colonoscopy in 3 years for surveillance                            with a more extensive bowel prep.                           - Patient has a contact number available for                            emergencies. The signs and symptoms of potential                            delayed complications were discussed with the                            patient. Return to normal activities tomorrow.  Written discharge instructions were provided to the                            patient.                           - Resume previous diet.                           - Continue present medications.                           - Await pathology results. Ladene Artist, MD 03/25/2017 8:34:07 AM This report has been signed electronically.

## 2017-03-25 NOTE — Progress Notes (Signed)
Pt's states no medical or surgical changes since previsit or office visit. 

## 2017-03-25 NOTE — Progress Notes (Signed)
Report to PACU, RN, vss, BBS= Clear.  

## 2017-03-25 NOTE — Patient Instructions (Signed)
Discharge instructions given. Handouts on polyps and hemorrhoids. Resume previous medications. YOU HAD AN ENDOSCOPIC PROCEDURE TODAY AT THE [facility]:   Refer to the procedure report that was given to you for any specific questions about what was found during the examination.  If the procedure report does not answer your questions, please call your gastroenterologist to clarify.  If you requested that your care partner not be given the details of your procedure findings, then the procedure report has been included in a sealed envelope for you to review at your convenience later.  YOU SHOULD EXPECT: Some feelings of bloating in the abdomen. Passage of more gas than usual.  Walking can help get rid of the air that was put into your GI tract during the procedure and reduce the bloating. If you had a lower endoscopy (such as a colonoscopy or flexible sigmoidoscopy) you may notice spotting of blood in your stool or on the toilet paper. If you underwent a bowel prep for your procedure, you may not have a normal bowel movement for a few days.  Please Note:  You might notice some irritation and congestion in your nose or some drainage.  This is from the oxygen used during your procedure.  There is no need for concern and it should clear up in a day or so.  SYMPTOMS TO REPORT IMMEDIATELY:   Following lower endoscopy (colonoscopy or flexible sigmoidoscopy):  Excessive amounts of blood in the stool  Significant tenderness or worsening of abdominal pains  Swelling of the abdomen that is new, acute  Fever of 100F or higher   For urgent or emergent issues, a gastroenterologist can be reached at any hour by calling [phone].   DIET:  We do recommend a small meal at first, but then you may proceed to your regular diet.  Drink plenty of fluids but you should avoid alcoholic beverages for 24 hours.  ACTIVITY:  You should plan to take it easy for the rest of today and you should NOT DRIVE  or use heavy machinery until tomorrow (because of the sedation medicines used during the test).    FOLLOW UP: Our staff will call the number listed on your records the next business day following your procedure to check on you and address any questions or concerns that you may have regarding the information given to you following your procedure. If we do not reach you, we will leave a message.  However, if you are feeling well and you are not experiencing any problems, there is no need to return our call.  We will assume that you have returned to your regular daily activities without incident.  If any biopsies were taken you will be contacted by phone or by letter within the next 1-3 weeks.  Please call us at [phone] if you have not heard about the biopsies in 3 weeks.    SIGNATURES/CONFIDENTIALITY: You and/or your care partner have signed paperwork which will be entered into your electronic medical record.  These signatures attest to the fact that that the information above on your After Visit Summary has been reviewed and is understood.  Full responsibility of the confidentiality of this discharge information lies with you and/or your care-partner. 

## 2017-03-25 NOTE — Progress Notes (Signed)
Called to room to assist during endoscopic procedure.  Patient ID and intended procedure confirmed with present staff. Received instructions for my participation in the procedure from the performing physician.  

## 2017-03-26 ENCOUNTER — Telehealth: Payer: Self-pay | Admitting: *Deleted

## 2017-03-26 NOTE — Telephone Encounter (Signed)
  Follow up Call-  Call back number 03/25/2017  Post procedure Call Back phone  # [phone]  Permission to leave phone message Yes  Some recent data might be hidden    [facility]

## 2017-03-26 NOTE — Telephone Encounter (Signed)
No answer, message left for the patient. 

## 2017-04-01 ENCOUNTER — Encounter: Payer: Self-pay | Admitting: Gastroenterology

## 2017-04-10 ENCOUNTER — Telehealth: Payer: Self-pay | Admitting: Gastroenterology

## 2017-04-10 NOTE — Telephone Encounter (Signed)
Left message for patient to call back  

## 2017-04-11 NOTE — Telephone Encounter (Signed)
Left message for patient to call back  

## 2017-04-11 NOTE — Telephone Encounter (Signed)
Patient left a voicemail that she was returning my call.  I attempted to return call and she did not answer.  Left message for patient to call back

## 2017-04-14 NOTE — Telephone Encounter (Signed)
No return call from the patient.  I will await her call

## 2017-07-26 ENCOUNTER — Emergency Department (HOSPITAL_COMMUNITY): Payer: Medicare Other

## 2017-07-26 ENCOUNTER — Other Ambulatory Visit: Payer: Self-pay

## 2017-07-26 ENCOUNTER — Encounter (HOSPITAL_COMMUNITY): Payer: Self-pay | Admitting: *Deleted

## 2017-07-26 ENCOUNTER — Emergency Department (HOSPITAL_COMMUNITY)
Admission: EM | Admit: 2017-07-26 | Discharge: 2017-07-26 | Disposition: A | Discharge status: Home / Self Care | Payer: Medicare Other | Attending: Emergency Medicine | Admitting: Emergency Medicine

## 2017-07-26 DIAGNOSIS — N200 Calculus of kidney: Secondary | ICD-10-CM | POA: Diagnosis not present

## 2017-07-26 DIAGNOSIS — Z853 Personal history of malignant neoplasm of breast: Secondary | ICD-10-CM | POA: Diagnosis not present

## 2017-07-26 DIAGNOSIS — R1030 Lower abdominal pain, unspecified: Secondary | ICD-10-CM | POA: Diagnosis present

## 2017-07-26 DIAGNOSIS — Z79899 Other long term (current) drug therapy: Secondary | ICD-10-CM | POA: Insufficient documentation

## 2017-07-26 DIAGNOSIS — I509 Heart failure, unspecified: Secondary | ICD-10-CM | POA: Diagnosis not present

## 2017-07-26 LAB — URINALYSIS, ROUTINE W REFLEX MICROSCOPIC
BILIRUBIN URINE: NEGATIVE
GLUCOSE, UA: NEGATIVE mg/dL
Ketones, ur: 5 mg/dL — AB
NITRITE: NEGATIVE
Protein, ur: NEGATIVE mg/dL
Specific Gravity, Urine: 1.019 (ref 1.005–1.030)
pH: 5 (ref 5.0–8.0)

## 2017-07-26 LAB — CBC WITH DIFFERENTIAL/PLATELET
BASOS PCT: 0 %
Basophils Absolute: 0 10*3/uL (ref 0.0–0.1)
Eosinophils Absolute: 0.2 10*3/uL (ref 0.0–0.7)
Eosinophils Relative: 2 %
HEMATOCRIT: 43.8 % (ref 36.0–46.0)
Hemoglobin: 15.1 g/dL — ABNORMAL HIGH (ref 12.0–15.0)
LYMPHS PCT: 10 %
Lymphs Abs: 1 10*3/uL (ref 0.7–4.0)
MCH: 30.5 pg (ref 26.0–34.0)
MCHC: 34.5 g/dL (ref 30.0–36.0)
MCV: 88.5 fL (ref 78.0–100.0)
MONO ABS: 0.6 10*3/uL (ref 0.1–1.0)
Monocytes Relative: 5 %
NEUTROS ABS: 8.9 10*3/uL — AB (ref 1.7–7.7)
Neutrophils Relative %: 83 %
PLATELETS: 153 10*3/uL (ref 150–400)
RBC: 4.95 MIL/uL (ref 3.87–5.11)
RDW: 12.9 % (ref 11.5–15.5)
WBC: 10.7 10*3/uL — AB (ref 4.0–10.5)

## 2017-07-26 LAB — COMPREHENSIVE METABOLIC PANEL
ALK PHOS: 93 U/L (ref 38–126)
ALT: 17 U/L (ref 14–54)
ANION GAP: 11 (ref 5–15)
AST: 26 U/L (ref 15–41)
Albumin: 4.1 g/dL (ref 3.5–5.0)
BILIRUBIN TOTAL: 0.7 mg/dL (ref 0.3–1.2)
BUN: 21 mg/dL — ABNORMAL HIGH (ref 6–20)
CALCIUM: 9.5 mg/dL (ref 8.9–10.3)
CO2: 23 mmol/L (ref 22–32)
Chloride: 106 mmol/L (ref 101–111)
Creatinine, Ser: 1.08 mg/dL — ABNORMAL HIGH (ref 0.44–1.00)
GFR calc Af Amer: 58 mL/min — ABNORMAL LOW (ref >60)
GFR, EST NON AFRICAN AMERICAN: 50 mL/min — AB (ref >60)
GLUCOSE: 138 mg/dL — AB (ref 65–99)
POTASSIUM: 3.9 mmol/L (ref 3.5–5.1)
Sodium: 140 mmol/L (ref 135–145)
TOTAL PROTEIN: 7.3 g/dL (ref 6.5–8.1)

## 2017-07-26 LAB — LIPASE, BLOOD: LIPASE: 33 U/L (ref 11–51)

## 2017-07-26 MED ORDER — HYDROCODONE-ACETAMINOPHEN 5-325 MG PO TABS: 1.0000 | ORAL_TABLET | ORAL | 0 refills | Status: DC | PRN

## 2017-07-26 MED ORDER — KETOROLAC TROMETHAMINE 30 MG/ML IJ SOLN
30.0000 mg | Freq: Once | INTRAMUSCULAR | Status: AC
  Administered 2017-07-26: 30 mg via INTRAVENOUS
  Filled 2017-07-26: qty 1

## 2017-07-26 MED ORDER — TAMSULOSIN HCL 0.4 MG PO CAPS: 0.4000 mg | ORAL_CAPSULE | Freq: Every day | ORAL | 0 refills | Status: DC

## 2017-07-26 MED ORDER — HYDROCODONE-ACETAMINOPHEN 5-325 MG PO TABS
1.0000 | ORAL_TABLET | Freq: Once | ORAL | Status: AC
  Administered 2017-07-26: 1 via ORAL
  Filled 2017-07-26: qty 1

## 2017-07-26 MED ORDER — ONDANSETRON 4 MG PO TBDP: 4.0000 mg | ORAL_TABLET | ORAL | 0 refills | Status: DC | PRN

## 2017-07-26 MED ORDER — SODIUM CHLORIDE 0.9 % IV SOLN
Freq: Once | INTRAVENOUS | Status: AC
  Administered 2017-07-26: 15:00:00 via INTRAVENOUS

## 2017-07-26 NOTE — ED Provider Notes (Signed)
[facility] DEPT Provider Note   CSN: [id number] Arrival date & time: 07/26/17  1401     History   Chief Complaint Chief Complaint  Patient presents with  . Flank Pain    Left    HPI [name] is a 71 y.o. female.  HPI Patient started developing left flank pain about an hour prior to presentation.  It started aching and then radiating down to her lower abdomen with a fullness sensation.  She reports earlier this morning she did have a couple of soft bowel movements but has not had any constipation or diarrhea.  No nausea vomiting or fever.  No history of kidney stone.  No injury.  No shortness of breath or cough. Past Medical History:  Diagnosis Date  . Atrophic vaginitis   . breast cancer 2014  . Breast lobular neoplasia    atypical  . CHF (congestive heart failure) ([place])   . Fibrocystic breast changes   . Heart murmur    grade 1  per patient -echo 7/14  . Hyperlipidemia   . Knee pain, bilateral   . Major depression in full remission ([place])   . Microcalcifications of the breast    atypical ductal hyperplasia with calcifications  . Osteopenia    Dr Toney Rakes  . Osteopenia   . PMB (postmenopausal bleeding)    Dr Toney Rakes  . Seasonal allergies   . Sleep apnea    cpap  . Vitamin D deficiency   . Wears glasses     Patient Active Problem List   Diagnosis Date Noted  . Personal history of malignant neoplasm of breast 10/08/2013  . Ovarian fibroma 04/14/2013  . Benign neoplasm of colon 02/09/2013  . Pelvic mass in female 01/27/2013  . Osteopenia 01/12/2013  . PMB (postmenopausal bleeding)   . Atrophic vaginitis   . VITAMIN D DEFICIENCY 11/15/2008  . OTHER ABNORMAL GLUCOSE 11/15/2008  . HYPERLIPIDEMIA 08/17/2007  . Carpal tunnel syndrome 07/03/2007  . FEMORAL BRUIT 07/03/2007    Past Surgical History:  Procedure Laterality Date  . CESAREAN SECTION  1976  . COLONOSCOPY  [id number], [id number]   polyps 2013 only, Dr. Fuller Plan  .  DILATION AND CURETTAGE OF UTERUS    . ENDOMETRIAL BIOPSY  2002   Uterine  polyps, Dr. Cherylann Banas  . HYSTEROSCOPY      OB History    Gravida Para Term Preterm AB Living   3 1 1   2 1    SAB TAB Ectopic Multiple Live Births                   Home Medications    Prior to Admission medications   Medication Sig Start Date End Date Taking? Authorizing Provider  alendronate (FOSAMAX) 70 MG tablet Take 70 mg by mouth once a week. Take with a full glass of water on an empty stomach.    [provider]  anastrozole (ARIMIDEX) 1 MG tablet Take 1 mg by mouth daily.    [provider]  Calcium 250 MG CAPS Take 1 capsule by mouth daily. 500 mg Calcium tab    [provider]  cholecalciferol (VITAMIN D) 1000 UNITS tablet Take 1,000 Units by mouth daily.    [provider]  cycloSPORINE (RESTASIS) 0.05 % ophthalmic emulsion Place 1 drop into both eyes 2 (two) times daily.    [provider]  DULoxetine (CYMBALTA) 60 MG capsule Take 60 mg by mouth daily.    [provider]  Evening Primrose Oil 1000 MG CAPS Take by mouth.    [provider]  fluticasone (FLONASE) 50 MCG/ACT nasal spray Place 2 sprays into the nose daily as needed for rhinitis or allergies.    [provider]  HYDROcodone-acetaminophen (NORCO/VICODIN) 5-325 MG tablet Take 1-2 tablets every 4 (four) hours as needed by mouth for moderate pain or severe pain. 07/26/17   Charlesetta Shanks, MD  Magnesium 500 MG TABS Take 250 mg by mouth 2 (two) times daily.     [provider]  meloxicam (MOBIC) 15 MG tablet Take 1 tablet (15 mg total) by mouth daily. Patient taking differently: Take 7.5 mg by mouth daily.  03/16/15   Hyatt, Max T, DPM  OMEGA-3 FATTY ACIDS PO Take by mouth.    [provider]  ondansetron (ZOFRAN ODT) 4 MG disintegrating tablet Take 1 tablet (4 mg total) every 4 (four) hours as needed by mouth for nausea or vomiting. 07/26/17   Charlesetta Shanks, MD  Polyethyl Glycol-Propyl Glycol (SYSTANE) 0.4-0.3 % SOLN Apply to eye. 2-3 x daily     [provider]  tamsulosin (FLOMAX) 0.4 MG CAPS capsule Take 1 capsule (0.4 mg total) daily by mouth. 07/26/17   Charlesetta Shanks, MD    Family History Family History  Problem Relation Age of Onset  . Alcohol abuse Mother   . Cancer Father        bone marrow cancer  . Alcohol abuse Father   . Von Willebrand disease Father   . Depression Son        Recovering alcoholic  . Depression Maternal Aunt   . Prostate cancer Maternal Uncle   . Diabetes Paternal Uncle   . Heart attack Paternal Uncle 68  . Cancer Paternal Grandmother        skin cancer  . Heart failure Paternal Grandmother        in 5s  . GER disease Paternal Grandmother        esophageal stricture  . GER disease Son   . Colon cancer Neg Hx   . Stomach cancer Neg Hx   . Stroke Neg Hx     Social History Social History   Tobacco Use  . Smoking status: Never Smoker  . Smokeless tobacco: Never Used  . Tobacco comment: second hand smoke for decades  Substance Use Topics  . Alcohol use: Yes    Alcohol/week: 1.2 oz    Types: 2 Glasses of wine per week    Comment: socially 1 - 2 glasses of wine per week  . Drug use: No     Allergies   Codeine and Oxycodone   Review of Systems Review of Systems 10 Systems reviewed and are negative for acute change except as noted in the HPI.   Physical Exam Updated Vital Signs BP 115/62 (BP Location: Right Arm)   Pulse 63   Temp (!) 97.5 F (36.4 C) (Oral)   Resp 20   SpO2 97%   Physical Exam  Constitutional: She is oriented to person, place, and time. She appears well-developed and well-nourished.  HENT:  Head: Normocephalic and atraumatic.  Eyes: EOM are normal. Pupils are equal, round, and reactive to light.  Neck: Neck supple.  Cardiovascular: Normal rate, regular rhythm, normal heart sounds and intact distal pulses.  Pulmonary/Chest: Effort normal and  breath sounds normal.  Abdominal: Soft. Bowel sounds are normal. She exhibits no distension. There is no tenderness.  Musculoskeletal: Normal range of motion. She exhibits no edema.  Neurological: She is alert and oriented to person, place, and time. She has normal strength. Coordination normal. GCS eye subscore is 4. GCS verbal subscore is 5. GCS motor subscore is 6.  Skin: Skin is warm, dry and intact.  Psychiatric: She has a normal mood and affect.     ED Treatments / Results  Labs (all labs ordered are listed, but only abnormal results are displayed) Labs Reviewed  URINALYSIS, ROUTINE W REFLEX MICROSCOPIC - Abnormal; Notable for the following components:      Result Value   APPearance HAZY (*)    Hgb urine dipstick MODERATE (*)    Ketones, ur 5 (*)    Leukocytes, UA TRACE (*)    Bacteria, UA FEW (*)    Squamous Epithelial / LPF 0-5 (*)    All other components within normal limits  COMPREHENSIVE METABOLIC PANEL - Abnormal; Notable for the following components:   Glucose, Bld 138 (*)    BUN 21 (*)    Creatinine, Ser 1.08 (*)    GFR calc non Af Amer 50 (*)    GFR calc Af Amer 58 (*)    All other components within normal limits  CBC WITH DIFFERENTIAL/PLATELET - Abnormal; Notable for the following components:   WBC 10.7 (*)    Hemoglobin 15.1 (*)    Neutro Abs 8.9 (*)    All other components within normal limits  LIPASE, BLOOD    EKG  EKG Interpretation None       Radiology Ct Renal Stone Study  Result Date: 07/26/2017 CLINICAL DATA:  Left flank pain and dysuria. EXAM: CT ABDOMEN AND PELVIS WITHOUT CONTRAST TECHNIQUE: Multidetector CT imaging of the abdomen and pelvis was performed following the standard protocol without IV contrast. COMPARISON:  Feb 01, 2013 FINDINGS: Lower chest: A tiny nodule in the right lung base is stable. No acute abnormalities in the lung bases. Hepatobiliary: No focal liver abnormality is seen. No gallstones, gallbladder wall thickening, or  biliary dilatation. Pancreas: Unremarkable. No pancreatic ductal dilatation or surrounding inflammatory changes. Spleen: Normal in size without focal abnormality. Adrenals/Urinary Tract: Adrenal glands are normal. There is mild hydronephrosis and perinephric stranding on the left in addition to mild prominence of the left ureter compared to the right and mild periureteral stranding. This extends to the level the bladder. There is a tiny 2 mm stone at the left UVJ best seen on coronal image 112. The right kidney, right ureter, and bladder are normal. Stomach/Bowel: The stomach and small bowel are normal. The colon and appendix are normal. Vascular/Lymphatic: No significant vascular findings are present. No enlarged abdominal or pelvic lymph nodes. Reproductive: Status post hysterectomy. No adnexal masses. Other: No abdominal wall hernia or abnormality. No abdominopelvic ascites. Musculoskeletal: No acute or significant osseous findings. IMPRESSION: 1. There is a 2 mm stone at the left UVJ resulting in mild hydronephrosis and perinephric stranding. 2. No other acute abnormalities. Electronically Signed   By: Dorise Bullion III M.D   On: 07/26/2017 15:53    Procedures Procedures (including critical care time)  Medications Ordered in ED Medications  HYDROcodone-acetaminophen (NORCO/VICODIN) 5-325 MG per tablet 1 tablet (not administered)  ketorolac (TORADOL) 30 MG/ML injection 30 mg (30 mg Intravenous Given 07/26/17 1518)  0.9 %  sodium chloride infusion ( Intravenous New Bag/Given 07/26/17 1518)     Initial Impression / Assessment and Plan / ED Course  I have reviewed the triage vital signs and the nursing notes.  Pertinent labs & imaging results that were  available during my care of the patient were reviewed by me and considered in my medical decision making (see chart for details).      Final Clinical Impressions(s) / ED Diagnoses   Final diagnoses:  Kidney stone  Got pain relief with IV  Toradol.  2 mm left kidney stone identified.  Patient counseled on plan for continue her daily meloxicam and hydrocodone if needed.  Patient is nontoxic alert and pain control.  Return precautions are reviewed.  ED Discharge Orders        Ordered    HYDROcodone-acetaminophen (NORCO/VICODIN) 5-325 MG tablet  Every 4 hours PRN     07/26/17 1628    tamsulosin (FLOMAX) 0.4 MG CAPS capsule  Daily     07/26/17 1628    ondansetron (ZOFRAN ODT) 4 MG disintegrating tablet  Every 4 hours PRN     07/26/17 1628       Charlesetta Shanks, MD 07/26/17 1633

## 2017-07-26 NOTE — ED Triage Notes (Signed)
Pt from home c/o L flank pain that started this am. Pt denies dysuria, frequent urination or hematuria. Pt also denies hx of the same. Pt is A&O and in NAD. Pt provided with heat pack for comfort.

## 2017-07-26 NOTE — Discharge Instructions (Signed)
1.  Continue your meloxicam as prescribed.  Discussed in the emergency department, make sure that her kidney function is being periodically monitored due to some increased risk of kidney injury with NSAID type medications. 2.  Use Vicodin as needed for pain control for kidney stones.  Use Zofran if needed for nausea either due to pain from kidney stone or from pain medication. 3.  Take Flomax once a day until your kidney stone has passed.

## 2017-07-26 NOTE — ED Notes (Signed)
Bed: WA13 Expected date:  Expected time:  Means of arrival:  Comments: 

## 2017-07-26 NOTE — ED Triage Notes (Signed)
Pt brought in by EMS, L flank pain  ? Kidney stone although no history. Nausea due to pain. Has been having mild constipation.

## 2017-07-26 NOTE — ED Notes (Signed)
Bed: [facility]ALD Expected date:  Expected time:  Means of arrival:  Comments: 

## 2017-07-28 ENCOUNTER — Encounter: Payer: Self-pay | Admitting: Neurology

## 2017-07-30 ENCOUNTER — Ambulatory Visit: Payer: Medicare Other | Admitting: Neurology

## 2017-07-30 ENCOUNTER — Encounter: Payer: Self-pay | Admitting: Neurology

## 2017-07-30 VITALS — BP 134/73 | HR 78 | Ht 64.0 in | Wt 201.0 lb

## 2017-07-30 DIAGNOSIS — Z9989 Dependence on other enabling machines and devices: Secondary | ICD-10-CM | POA: Diagnosis not present

## 2017-07-30 DIAGNOSIS — G4733 Obstructive sleep apnea (adult) (pediatric): Secondary | ICD-10-CM

## 2017-07-30 NOTE — Patient Instructions (Addendum)
Keep up the good work! We will see you back in one year.   Please continue using your CPAP regularly. While your insurance requires that you use CPAP at least 4 hours each night on 70% of the nights, I recommend, that you not skip any nights and use it throughout the night if you can. Getting used to CPAP and staying with the treatment long term does take time and patience and discipline. Untreated obstructive sleep apnea when it is moderate to severe can have an adverse impact on cardiovascular health and raise her risk for heart disease, arrhythmias, hypertension, congestive heart failure, stroke and diabetes. Untreated obstructive sleep apnea causes sleep disruption, nonrestorative sleep, and sleep deprivation. This can have an impact on your day to day functioning and cause daytime sleepiness and impairment of cognitive function, memory loss, mood disturbance, and problems focussing. Using CPAP regularly can improve these symptoms.

## 2017-07-30 NOTE — Progress Notes (Signed)
Subjective:    Patient ID: [name] is a 71 y.o. female.  HPI     Interim history:   [name] is a 71 year old right-handed woman with an underlying medical history of left-sided breast cancer, status post bilateral mastectomy in July 2014, status post chemotherapy, osteopenia, CHF secondary to chemotherapy with subsequent improvement in EF, impaired fasting glucose, history of depression, vitamin D deficiency, and obesity, who presents for follow-up consultation of her sleep disorder, in particular her recent diagnosis of obstructive sleep apnea, now on treatment with CPAP. The patient is unaccompanied today. I last saw her on 01/29/2017, at which time she was compliant with her CPAP, she indicated sleeping better, having more restful sleep and better daytime energy. I suggested a six-month follow-up.   Today, 07/30/2017: I reviewed her CPAP compliance data from 06/29/2017 through 07/28/2017 which is a total of 30 days, during which time she used her CPAP 24 days with percent used days greater than 4 hours at 60% only, indicating suboptimal compliance with an average usage of 6 hours and 10 minutes, residual AHI 4.4 per hour, leak on the low side with the 95th percentile at 6.7 L/m on a pressure of 8 cm with EPR of 3. In the past 90 days her compliance percentage for more than 4 hours was 72%, adequate. Residual AHI borderline at 5.5 per hour. She reports doing well with CPAP. She had taken her machine to her trip to [place]. There were intermittent access to using her CPAP but she was using it when she quit. There are some lapses in her treatment that would be explained by that. Overall, she feels that she has done quite well with it, has adapted to treatment, and is motivated to continue. She feels like she has benefited from using CPAP and that she has more consolidated sleep which appears to be more restful. She had a recent issue with a kidney stone and went to the emergency room last month. She  was able to pass the kidney stone. She has a follow-up with her primary care physician next month.  The patient's allergies, current medications, family history, past medical history, past social history, past surgical history and problem list were reviewed and updated as appropriate.    Previously (copied from previous notes for reference):   I first met her on 10/29/2016 at the request of her primary care physician, at which time she reported snoring, breathing pauses while asleep as witnessed by family and daytime somnolence. I invited her for sleep study testing. She had a baseline sleep study, followed by a CPAP titration study. I went over her test results with her in detail today. Her baseline sleep study from 11/10/2016 showed a sleep efficiency of 73.4%, sleep latency 32.5 minutes and REM sleep was absent. She had an increased percentage of stage I sleep, a mildly increased percentage of slow-wave sleep and absence of REM sleep. She had a total AHI of 7.4 per hour, supine AHI of 48.8 per hour, average oxygen saturation was 96%, nadir was 87%. She had no significant PLMS. The absence of REM sleep during the study likely underestimated her AHI and O2 nadir. Based on her test results and sleep related complaints I invited her for a full night CPAP titration study. She had this on 11/12/2016. Sleep efficiency was 46.4%, sleep latency prolonged at 153 minutes, REM sleep was absent. She was fitted with a medium nasal pillows interface and CPAP was titrated from 5 cm to 9 cm.  On a pressure of 8 cm her AHI was 1.9 per hour, supine sleep was achieved but no REM sleep. O2 nadir was 91%. Based on her test results are prescribed CPAP therapy for home use.   I reviewed her CPAP compliance data from 12/29/2016 through 01/27/2017 which is a total of 30 days, during which time she used her CPAP every night with percent used days greater than 4 hours at 93%, indicating excellent compliance with an average usage of 6  hours and 42 minutes, residual AHI borderline at 6.1 per hour, leak on the higher side for the 95th percentile at 18 L/m on a pressure of 8 cm with EPR.    10/29/2016: She reports snoring and excessive daytime somnolence. I reviewed your office note from 08/07/2016, which you kindly included. Her Epworth sleepiness score is 11 out of 24 today, her fatigue score is 38 out of 63. She is divorced for years. She lives alone. She is retired. She has 1 son and 2 grandchildren. She is a nonsmoker but was exposed to secondhand smoke, drinks alcohol occasionally, and drinks usually 1 cup of coffee per day and occasional diet soda. She was recently observed by her son to have loud snoring and apneic pauses while asleep when they went on a volleyball tournament. She has been told by friends when she shared a room that sleep was very interrupted and they got concerning due to her loud snoring. She denies nocturnal or morning headaches. 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 She denies night to night nocturia. Is typically in bed between 10 and 11 and wake up time is around 6:30, she likes to go for morning water walking exercises and also does yoga. She lives alone with one small dog that sleeps in her queen size bed with her. She is a restless and light sleeper.   Her Past Medical History Is Significant For: Past Medical History:  Diagnosis Date  . Atrophic vaginitis   . breast cancer 2014  . Breast lobular neoplasia    atypical  . CHF (congestive heart failure) ([place])   . Fibrocystic breast changes   . Heart murmur    grade 1  per patient -echo 7/14  . Hyperlipidemia   . Knee pain, bilateral   . Major depression in full remission ([place])   . Microcalcifications of the breast    atypical ductal hyperplasia with calcifications  . Osteopenia    Dr Toney Rakes  . Osteopenia   . PMB (postmenopausal bleeding)     Dr Toney Rakes  . Seasonal allergies   . Sleep apnea    cpap  . Vitamin D deficiency   . Wears glasses     Her Past Surgical History Is Significant For: Past Surgical History:  Procedure Laterality Date  . CESAREAN SECTION  1976  . COLONOSCOPY  [id number], [id number]   polyps 2013 only, Dr. Fuller Plan  . DILATION AND CURETTAGE OF UTERUS    . ENDOMETRIAL BIOPSY  2002   Uterine  polyps, Dr. Cherylann Banas  . HYSTEROSCOPY      Her Family History Is Significant For: Family History  Problem Relation Age of Onset  . Alcohol abuse Mother   . Cancer Father        bone marrow cancer  . Alcohol abuse Father   . Von Willebrand disease Father   . Depression Son        Recovering alcoholic  .  Depression Maternal Aunt   . Prostate cancer Maternal Uncle   . Diabetes Paternal Uncle   . Heart attack Paternal Uncle 26  . Cancer Paternal Grandmother        skin cancer  . Heart failure Paternal Grandmother        in 6s  . GER disease Paternal Grandmother        esophageal stricture  . GER disease Son   . Colon cancer Neg Hx   . Stomach cancer Neg Hx   . Stroke Neg Hx     Her Social History Is Significant For: Social History   Socioeconomic History  . Marital status: Divorced    Spouse name: None  . Number of children: None  . Years of education: None  . Highest education level: None  Social Needs  . Financial resource strain: None  . Food insecurity - worry: None  . Food insecurity - inability: None  . Transportation needs - medical: None  . Transportation needs - non-medical: None  Occupational History  . None  Tobacco Use  . Smoking status: Never Smoker  . Smokeless tobacco: Never Used  . Tobacco comment: second hand smoke for decades  Substance and Sexual Activity  . Alcohol use: Yes    Alcohol/week: 1.2 oz    Types: 2 Glasses of wine per week    Comment: socially 1 - 2 glasses of wine per week  . Drug use: No  . Sexual activity: No    Birth control/protection: Post-menopausal   Other Topics Concern  . None  Social History Narrative  . None    Her Allergies Are:  Allergies  Allergen Reactions  . Codeine Rash    Post C section  . Oxycodone Itching  :   Her Current Medications Are:  Outpatient Encounter Medications as of 07/30/2017  Medication Sig  . anastrozole (ARIMIDEX) 1 MG tablet Take 1 mg by mouth daily.  . Calcium 250 MG CAPS Take 1 capsule by mouth daily. 500 mg Calcium tab  . cholecalciferol (VITAMIN D) 1000 UNITS tablet Take 1,000 Units by mouth daily.  . cycloSPORINE (RESTASIS) 0.05 % ophthalmic emulsion Place 1 drop into both eyes 2 (two) times daily.  . DULoxetine (CYMBALTA) 60 MG capsule Take 60 mg by mouth daily.  . fluticasone (FLONASE) 50 MCG/ACT nasal spray Place 2 sprays into the nose daily as needed for rhinitis or allergies.  . Magnesium 500 MG TABS Take 250 mg by mouth 2 (two) times daily.   . meloxicam (MOBIC) 15 MG tablet Take 1 tablet (15 mg total) by mouth daily. (Patient taking differently: Take 7.5 mg by mouth daily. )  . OMEGA-3 FATTY ACIDS PO Take by mouth.  . Omeprazole (PRILOSEC PO) Take every morning by mouth.  Vladimir Faster Glycol-Propyl Glycol (SYSTANE) 0.4-0.3 % SOLN Apply to eye. 2-3 x daily   . [DISCONTINUED] alendronate (FOSAMAX) 70 MG tablet Take 70 mg by mouth once a week. Take with a full glass of water on an empty stomach.  . [DISCONTINUED] Evening Primrose Oil 1000 MG CAPS Take by mouth.  . [DISCONTINUED] HYDROcodone-acetaminophen (NORCO/VICODIN) 5-325 MG tablet Take 1-2 tablets every 4 (four) hours as needed by mouth for moderate pain or severe pain.  . [DISCONTINUED] ondansetron (ZOFRAN ODT) 4 MG disintegrating tablet Take 1 tablet (4 mg total) every 4 (four) hours as needed by mouth for nausea or vomiting.  . [DISCONTINUED] tamsulosin (FLOMAX) 0.4 MG CAPS capsule Take 1 capsule (0.4 mg total) daily by mouth.  Facility-Administered Encounter Medications as of 07/30/2017  Medication  . 0.9 %  sodium chloride  infusion  :  Review of Systems:  Out of a complete 14 point review of systems, all are reviewed and negative with the exception of these symptoms as listed below: Review of Systems  Neurological:       Pt presents today to follow up on her cpap. Pt reports that her cpap is going better than it did.    Objective:  Neurological Exam  Physical Exam Physical Examination:   Vitals:   07/30/17 1129  BP: 134/73  Pulse: 78    General Examination: The patient is a very pleasant 71 y.o. female in no acute distress. She appears well-developed and well-nourished and well groomed.   HEENT:Normocephalic, atraumatic, pupils are equal, round and reactive to light and accommodation. Mild bilateral cataracts are noted. Corrective eyeglasses in place. Extraocular tracking is good without limitation to gaze excursion or nystagmus noted. Normal smooth pursuit is noted. Hearing is grossly intact. Face is symmetric with normal facial animation and normal facial sensation. Speech is clear with no dysarthria noted. There is no hypophonia. There is no lip, neck/head, jaw or voice tremor. Neck is supple with full range of passive and active motion. There are no carotid bruits on auscultation. Oropharynx exam reveals: mildmouth dryness, gooddental hygiene and mildairway crowding. Mallampati is class II. Tongue protrudes centrally and palate elevates symmetrically. Tonsils are 1+ in size.   Chest:Clear to auscultation without wheezing, rhonchi or crackles noted.  Heart:S1+S2+0, regular and normal without murmurs, rubs or gallops noted.   Abdomen:Soft, non-tender and non-distended with normal bowel sounds appreciated on auscultation.  Extremities:There is nopitting edema in the distal lower extremities bilaterally. Pedal pulses are intact.  Skin: Warm and dry without trophic changes noted. There are mild varicose veins.  Musculoskeletal: exam reveals no obvious joint deformities, tenderness or  joint swelling or erythema.   Neurologically:  Mental status: The patient is awake, alert and oriented in all 4 spheres. Herimmediate and remote memory, attention, language skills and fund of knowledge are appropriate. There is no evidence of aphasia, agnosia, apraxia or anomia. Speech is clear with normal prosody and enunciation. Thought process is linear. Mood is normaland affect is normal.  Cranial nerves II - XII are as described above under HEENT exam. Motor exam: Normal bulk, strength and tone is noted. There is no drift, resting tremor. Romberg is negative. Reflexes are 1-2+ throughout. Fine motor skills and coordination: grossly intact.  Cerebellar testing: No dysmetria or intention tremor. There is no truncal or gait ataxia.  Sensory exam: intact to light touch the upper and lower extremities.  Gait, station and balance: Shestands easily. No veering to one side is noted. No leaning to one side is noted. Posture is age-appropriate and stance is narrow based. Gait shows normalstride length and normalpace. No problems turning are noted.   Assessment and Plan:  In summary, Emma G Rayis a very pleasant 71 year old female with an underlying medical history of left-sided breast cancer, status post bilateralmastectomies, s/pchemotherapy, osteopenia, CHFdeemed secondary to chemotherapy (with subsequent improvement in EF), impaired fasting glucose, history of depression, vitamin D deficiency, and obesity, who presents for follow-up consultation of her obstructive sleep apnea, established on CPAP therapy. She had a baseline sleep study, followed by a CPAP titration study in February 2018. She has been compliant with CPAP. She has adapted to treatment quite well and has benefited from it. She is commended for her treatment adherence. She  is up-to-date with his supplies and will order new supplies soon. Physical exam is stable. I suggested she continue with her CPAP at the current settings. I  answered all her questions today and I suggested a one-year checkup. She was in agreement.  I spent 20 minutes in total face-to-face time with the patient, more than 50% of which was spent in counseling and coordination of care, reviewing test results, reviewing medication and discussing or reviewing the diagnosis of OSA, its prognosis and treatment options. Pertinent laboratory and imaging test results that were available during this visit with the patient were reviewed by me and considered in my medical decision making (see chart for details).

## 2017-08-28 ENCOUNTER — Encounter (HOSPITAL_COMMUNITY): Payer: Medicare Other

## 2017-09-03 ENCOUNTER — Other Ambulatory Visit (HOSPITAL_COMMUNITY): Payer: Self-pay

## 2017-09-04 ENCOUNTER — Encounter (HOSPITAL_COMMUNITY): Payer: Medicare Other

## 2017-09-04 ENCOUNTER — Ambulatory Visit (HOSPITAL_COMMUNITY)
Admission: RE | Admit: 2017-09-04 | Discharge: 2017-09-04 | Disposition: A | Discharge status: Home / Self Care | Payer: Medicare Other | Source: Ambulatory Visit | Attending: Internal Medicine | Admitting: Internal Medicine

## 2017-09-04 DIAGNOSIS — M810 Age-related osteoporosis without current pathological fracture: Secondary | ICD-10-CM | POA: Diagnosis present

## 2017-09-04 MED ORDER — ZOLEDRONIC ACID 5 MG/100ML IV SOLN
INTRAVENOUS | Status: AC
  Administered 2017-09-04: 5 mg via INTRAVENOUS
  Filled 2017-09-04: qty 100

## 2017-09-04 MED ORDER — ZOLEDRONIC ACID 5 MG/100ML IV SOLN
5.0000 mg | Freq: Once | INTRAVENOUS | Status: AC
  Administered 2017-09-04: 5 mg via INTRAVENOUS

## 2017-09-04 NOTE — Discharge Instructions (Signed)

## 2017-10-14 ENCOUNTER — Ambulatory Visit (INDEPENDENT_AMBULATORY_CARE_PROVIDER_SITE_OTHER): Payer: Medicare Other | Admitting: Licensed Clinical Social Worker

## 2017-10-14 DIAGNOSIS — F3341 Major depressive disorder, recurrent, in partial remission: Secondary | ICD-10-CM

## 2017-11-18 ENCOUNTER — Ambulatory Visit: Payer: Medicare Other | Admitting: Licensed Clinical Social Worker

## 2017-12-16 ENCOUNTER — Ambulatory Visit: Payer: Self-pay | Admitting: Licensed Clinical Social Worker

## 2018-01-15 ENCOUNTER — Telehealth: Payer: Self-pay | Admitting: Neurology

## 2018-01-15 NOTE — Telephone Encounter (Signed)
Received this notice from [place]: "I have Primitivo Gauze calling her now and we can schedule her for a mask fit and check her machine too"

## 2018-01-15 NOTE — Telephone Encounter (Signed)
Pt calling to inform that a couple of days ago she noticed the pressue went up on her CPAP. Pt asking an order be sent to [facility] to have the pressure reset to what it was

## 2018-01-15 NOTE — Telephone Encounter (Signed)
Received this notice from [facility]: "called pt in regards to noise from her cpap, I did look her up in [facility] and notice she has a significant leak, I did explain that the leakage will make the machine feel like it is blowing too hard, she stated that she had felt air coming out. I also looked when patient last ordered supplies and she has not had new cushions since 05/23/17, and new masks since she was set up 12/06/16, I did explain that new supplies are necessary for a good seal, she told me that she had 1 new cushion and she would put that on and I asked if she would like me to order supplies and she said no that she just ordered some on line., she thanked me and I got her email so that when she needs supplies or anything again she can email me."

## 2018-01-15 NOTE — Telephone Encounter (Signed)
I reviewed pt's cpap therapy data. It does not appear to have changed from the ordered pressure of 8 cm H2O.   I called pt. She feels that the cpap pressure is higher, and blowing more air, and "is making a noise from all the air." I advised her that on our end, it does not look like the pressure has changed. Pt may need to bring her machine to [facility] to look at and make sure it is functioning correctly. I will contact [facility] regarding this. Pt verbalized understanding.

## 2018-01-29 ENCOUNTER — Ambulatory Visit (INDEPENDENT_AMBULATORY_CARE_PROVIDER_SITE_OTHER): Payer: Medicare Other | Admitting: Licensed Clinical Social Worker

## 2018-01-29 DIAGNOSIS — F3341 Major depressive disorder, recurrent, in partial remission: Secondary | ICD-10-CM | POA: Diagnosis not present

## 2018-05-28 ENCOUNTER — Encounter: Payer: Self-pay | Admitting: Oncology

## 2018-05-28 ENCOUNTER — Telehealth: Payer: Self-pay | Admitting: Oncology

## 2018-05-28 NOTE — Telephone Encounter (Signed)
Pt has cld to schedule an appt to see Dr. Jana Hakim on 09/28/18. Pt is transferring her care from [facility] due to the distance. Address verified. Letter mailed.

## 2018-06-24 ENCOUNTER — Telehealth (INDEPENDENT_AMBULATORY_CARE_PROVIDER_SITE_OTHER): Payer: Self-pay | Admitting: Orthopaedic Surgery

## 2018-06-24 NOTE — Telephone Encounter (Signed)
Returned call to patient left message on voicemail concerning her appointment date and time of appointment [phone]

## 2018-06-29 ENCOUNTER — Ambulatory Visit (INDEPENDENT_AMBULATORY_CARE_PROVIDER_SITE_OTHER): Payer: Self-pay

## 2018-06-29 ENCOUNTER — Encounter (INDEPENDENT_AMBULATORY_CARE_PROVIDER_SITE_OTHER): Payer: Self-pay | Admitting: Orthopaedic Surgery

## 2018-06-29 ENCOUNTER — Ambulatory Visit (INDEPENDENT_AMBULATORY_CARE_PROVIDER_SITE_OTHER): Payer: Medicare Other | Admitting: Orthopaedic Surgery

## 2018-06-29 ENCOUNTER — Ambulatory Visit (INDEPENDENT_AMBULATORY_CARE_PROVIDER_SITE_OTHER): Payer: Medicare Other

## 2018-06-29 DIAGNOSIS — M5431 Sciatica, right side: Secondary | ICD-10-CM

## 2018-06-29 DIAGNOSIS — M79604 Pain in right leg: Secondary | ICD-10-CM

## 2018-06-29 DIAGNOSIS — M4317 Spondylolisthesis, lumbosacral region: Secondary | ICD-10-CM

## 2018-06-29 MED ORDER — METHYLPREDNISOLONE 4 MG PO TABS: ORAL_TABLET | ORAL | 0 refills | Status: DC

## 2018-06-29 MED ORDER — METHOCARBAMOL 500 MG PO TABS: 500.0000 mg | ORAL_TABLET | Freq: Four times a day (QID) | ORAL | 0 refills | Status: DC | PRN

## 2018-06-29 NOTE — Progress Notes (Signed)
Office Visit Note   Patient: [name]           Date of Birth: 12-29-1945           MRN: [id number] Visit Date: 06/29/2018              Requested by: Marton Redwood, MD [address] [place], [place] [address] PCP: Marton Redwood, MD   Assessment & Plan: Visit Diagnoses:  1. Pain in right leg   2. Spondylolisthesis, lumbosacral region   3. Sciatica, right side     Plan: Based on her clinical exam, signs and symptoms as well as plain film findings lumbar spine I do feel that her symptoms are spine related.  I would like to send her to formal physical therapy and try a 6-day steroid taper and some Robaxin.  She will continue her 15 mg of meloxicam daily.  I like to see her back in just 2 weeks to see how she is doing overall.  If she continues to have the sciatic symptoms in spite of conservative treatment we would likely recommend an MRI of the lumbar spine.  All question concerns were answered and addressed.  Follow-Up Instructions: Return in about 2 weeks (around 07/13/2018).   Orders:  Orders Placed This Encounter  Procedures  . XR HIP UNILAT W OR W/O PELVIS 1V RIGHT  . XR Lumbar Spine 2-3 Views   Meds ordered this encounter  Medications  . methylPREDNISolone (MEDROL) 4 MG tablet    Sig: Medrol dose pack. Take as instructed    Dispense:  21 tablet    Refill:  0  . methocarbamol (ROBAXIN) 500 MG tablet    Sig: Take 1 tablet (500 mg total) by mouth every 6 (six) hours as needed for muscle spasms.    Dispense:  40 tablet    Refill:  0      Procedures: No procedures performed   Clinical Data: No additional findings.   Subjective: Chief Complaint  Patient presents with  . Right Hip - Pain  The patient somewhat I am seeing for the first time.  She is referred from her primary care physician Dr. Brigitte Pulse to evaluate right-sided sciatica and low back pain with right hip pain.  This does radiate down to her foot on occasion she gets numbness and tingling.  She actually  saw Dr. Ellene Route years ago for neck and lumbar spine issues and she was told she had a slip in her lumbar spine.  She got over the symptoms with therapy.  This is now been going on for about 5 to 6 weeks now.  She is continuing water aerobics and water therapy but not formal physical therapy.  She is not a diabetic.  She denies any groin pain.  She denies any weakness.  Is more of an aggravating pain with weightbearing.  She is recently had her meloxicam increased to 50 mg daily. HPI  Review of Systems She currently denies any headache, chest pain, shortness of breath, fever, chills, nausea, vomiting.  Objective: Vital Signs: There were no vitals taken for this visit.  Physical Exam She is alert and oriented x3 and in no acute distress Ortho Exam Examination of her bilateral lower extremities showed no significant weakness or numbness and tingling today.  She has negative straight leg raise bilaterally and normal hip exam bilaterally. Specialty Comments:  No specialty comments available.  Imaging: Xr Hip Unilat W Or W/o Pelvis 1v Right  Result Date: 06/29/2018 An AP pelvis  and lateral the right hip shows mild arthritic changes with an acetabular bone spur but similar on the left side.  There is no acute findings otherwise.  Xr Lumbar Spine 2-3 Views  Result Date: 06/29/2018 2 views of the lumbar spine shows significant grade 1 spondylolisthesis at L4-L5 with significant para-articular osteophytes at multiple levels of the lumbar spine.    PMFS History: Patient Active Problem List   Diagnosis Date Noted  . Sciatica, right side 06/29/2018  . Personal history of malignant neoplasm of breast 10/08/2013  . Ovarian fibroma 04/14/2013  . Benign neoplasm of colon 02/09/2013  . Pelvic mass in female 01/27/2013  . Osteopenia 01/12/2013  . PMB (postmenopausal bleeding)   . Atrophic vaginitis   . VITAMIN D DEFICIENCY 11/15/2008  . OTHER ABNORMAL GLUCOSE 11/15/2008  . HYPERLIPIDEMIA  08/17/2007  . Carpal tunnel syndrome 07/03/2007  . FEMORAL BRUIT 07/03/2007   Past Medical History:  Diagnosis Date  . Atrophic vaginitis   . breast cancer 2014  . Breast lobular neoplasia    atypical  . CHF (congestive heart failure) ([place])   . Fibrocystic breast changes   . Heart murmur    grade 1  per patient -echo 7/14  . Hyperlipidemia   . Knee pain, bilateral   . Major depression in full remission ([place])   . Microcalcifications of the breast    atypical ductal hyperplasia with calcifications  . Osteopenia    Dr Toney Rakes  . Osteopenia   . PMB (postmenopausal bleeding)    Dr Toney Rakes  . Seasonal allergies   . Sleep apnea    cpap  . Vitamin D deficiency   . Wears glasses     Family History  Problem Relation Age of Onset  . Alcohol abuse Mother   . Cancer Father        bone marrow cancer  . Alcohol abuse Father   . Von Willebrand disease Father   . Depression Son        Recovering alcoholic  . Depression Maternal Aunt   . Prostate cancer Maternal Uncle   . Diabetes Paternal Uncle   . Heart attack Paternal Uncle 54  . Cancer Paternal Grandmother        skin cancer  . Heart failure Paternal Grandmother        in 32s  . GER disease Paternal Grandmother        esophageal stricture  . GER disease Son   . Colon cancer Neg Hx   . Stomach cancer Neg Hx   . Stroke Neg Hx     Past Surgical History:  Procedure Laterality Date  . ABDOMINAL HYSTERECTOMY N/A 03/02/2013   Procedure: TOTAL HYSTERECTOMY ABDOMINAL;  Surgeon: Imagene Gurney A. Alycia Rossetti, MD;  Location: [facility];  Service: Gynecology;  Laterality: N/A;  . AXILLARY LYMPH NODE BIOPSY Left 04/08/2013   Procedure: AXILLARY LYMPH NODE BIOPSY and axillary lymph node disection;  Surgeon: [name], MD;  Location: [facility];  Service: General;  Laterality: Left;  nuclear medicine injection   . BREAST BIOPSY  01/09/2012   Procedure: BREAST BIOPSY WITH NEEDLE LOCALIZATION;  Surgeon: [name], MD;  Location:  [place];  Service: General;  Laterality: Right;  . CESAREAN SECTION  1976  . COLONOSCOPY  [id number], [id number]   polyps 2013 only, Dr. Fuller Plan  . DILATION AND CURETTAGE OF UTERUS    . ENDOMETRIAL BIOPSY  2002   Uterine  polyps, Dr. Cherylann Banas  . HYSTEROSCOPY    .  PORT-A-CATH REMOVAL Right 01/02/2015   Procedure: REMOVAL PORT-A-CATH;  Surgeon: Armandina Gemma, MD;  Location: [facility];  Service: General;  Laterality: Right;  . PORTACATH PLACEMENT Right 05/04/2013   Procedure: INSERTION PORT-A-CATH;  Surgeon: [name], MD;  Location: [facility];  Service: General;  Laterality: Right;  . SALPINGOOPHORECTOMY Bilateral 03/02/2013   Procedure: BILATERAL SALPINGO OOPHORECTOMY;  Surgeon: Imagene Gurney A. Alycia Rossetti, MD;  Location: [facility];  Service: Gynecology;  Laterality: Bilateral;  . TOTAL MASTECTOMY Bilateral 04/08/2013   Procedure: TOTAL MASTECTOMY;  Surgeon: [name], MD;  Location: [facility];  Service: General;  Laterality: Bilateral;  needs bed for over night stay   Social History   Occupational History  . Not on file  Tobacco Use  . Smoking status: Never Smoker  . Smokeless tobacco: Never Used  . Tobacco comment: second hand smoke for decades  Substance and Sexual Activity  . Alcohol use: Yes    Alcohol/week: 2.0 standard drinks    Types: 2 Glasses of wine per week    Comment: socially 1 - 2 glasses of wine per week  . Drug use: No  . Sexual activity: Never    Birth control/protection: Post-menopausal

## 2018-07-13 ENCOUNTER — Encounter (INDEPENDENT_AMBULATORY_CARE_PROVIDER_SITE_OTHER): Payer: Self-pay | Admitting: Orthopaedic Surgery

## 2018-07-13 ENCOUNTER — Ambulatory Visit (INDEPENDENT_AMBULATORY_CARE_PROVIDER_SITE_OTHER): Payer: Medicare Other | Admitting: Orthopaedic Surgery

## 2018-07-13 DIAGNOSIS — M5431 Sciatica, right side: Secondary | ICD-10-CM

## 2018-07-13 DIAGNOSIS — M79604 Pain in right leg: Secondary | ICD-10-CM

## 2018-07-13 DIAGNOSIS — M4317 Spondylolisthesis, lumbosacral region: Secondary | ICD-10-CM | POA: Diagnosis not present

## 2018-07-13 NOTE — Progress Notes (Signed)
The patient is here for follow-up after going to physical therapy for low back pain and sciatic symptoms to her right side.  She is very active 72 years old.  We also put her on a regimen of anti-inflammatory medications and a steroid taper.  She is doing much better overall.  She feels like she is 80 to 85% better.  She does have still some numbness in her right foot and she is not a diabetic.  She goes to therapy again tomorrow.  On exam she has negative straight leg raise bilaterally with excellent strength in her bilateral lower extremities and decreased sciatic symptoms.  She has some subjective numbness in her right foot.  At this point she is doing much better so follow-up can be as needed.  However my next step would be to obtain an MRI if she is not making progress anymore or is having worsening symptoms.  All question concerns were answered and addressed.  Follow-up will be as needed.

## 2018-07-16 ENCOUNTER — Other Ambulatory Visit: Payer: Self-pay | Admitting: *Deleted

## 2018-07-16 MED ORDER — ANASTROZOLE 1 MG PO TABS: 1.0000 mg | ORAL_TABLET | Freq: Every day | ORAL | 2 refills | Status: DC

## 2018-08-02 ENCOUNTER — Encounter: Payer: Self-pay | Admitting: Neurology

## 2018-08-04 ENCOUNTER — Encounter: Payer: Self-pay | Admitting: Neurology

## 2018-08-04 ENCOUNTER — Ambulatory Visit: Payer: Medicare Other | Admitting: Neurology

## 2018-08-04 VITALS — BP 121/73 | HR 62 | Ht 64.5 in | Wt 190.0 lb

## 2018-08-04 DIAGNOSIS — G4733 Obstructive sleep apnea (adult) (pediatric): Secondary | ICD-10-CM

## 2018-08-04 DIAGNOSIS — Z9989 Dependence on other enabling machines and devices: Secondary | ICD-10-CM | POA: Diagnosis not present

## 2018-08-04 NOTE — Progress Notes (Signed)
Subjective:    Patient ID: [name] is a 72 y.o. female.  HPI     Interim history:   [name] is a 72 year old right-handed woman with an underlying medical history of left-sided breast cancer, status post bilateral mastectomy in July 2014, status post chemotherapy, osteopenia, CHF secondary to chemotherapy with subsequent improvement in EF, impaired fasting glucose, history of depression, vitamin D deficiency, and obesity, who presents for follow-up consultation of her obstructive sleep apnea, on treatment with CPAP therapy. The patient is unaccompanied today. I last saw her on 07/30/2017, at which time she was on CPAP therapy, but had some lapses and treatment secondary to travel. She has adapted well to treatment. She felt that sleep was better quality and more restful. She had a recent issue with a kidney stone.   Today, 08/04/2018: I reviewed her CPAP compliance data from 07/04/2018 through 08/02/2018 which is a total of 30 days, during which time she used her machine 18 days with percent used days greater than 4 hours at 47%, indicating suboptimal compliance with an average usage of 5 hours and 59 minutes for days on treatment, residual AHI borderline at 6.5 per hour, leak acceptable with the 95th percentile at 10.6 L/m on a pressure of 8 cm with EPR of 3. In the past 90 days she used her machine 49 days with percent used days greater than 4 hours at 34%. She reports having had trouble with her nasal interface. She recently tried nasal pillows but they were too tight. She went back to using a nasal mask which has been okay. She has had issues with leg pain and hip pain, increasing discomfort at night. She has done physical therapy for this. She would like to look into getting a travel CPAP. She has been working on weight loss. She recently started Wellbutrin for her depression which has been working well for her. She has seen a Social worker as well.  The patient's allergies, current medications,  family history, past medical history, past social history, past surgical history and problem list were reviewed and updated as appropriate.    Previously (copied from previous notes for reference):      I saw her on 01/29/2017, at which time she was compliant with her CPAP, she indicated sleeping better, having more restful sleep and better daytime energy. I suggested a six-month follow-up.    I reviewed her CPAP compliance data from 06/29/2017 through 07/28/2017 which is a total of 30 days, during which time she used her CPAP 24 days with percent used days greater than 4 hours at 60% only, indicating suboptimal compliance with an average usage of 6 hours and 10 minutes, residual AHI 4.4 per hour, leak on the low side with the 95th percentile at 6.7 L/m on a pressure of 8 cm with EPR of 3. In the past 90 days her compliance percentage for more than 4 hours was 72%, adequate. Residual AHI borderline at 5.5 per hour.   I first met her on 10/29/2016 at the request of her primary care physician, at which time she reported snoring, breathing pauses while asleep as witnessed by family and daytime somnolence. I invited her for sleep study testing. She had a baseline sleep study, followed by a CPAP titration study. I went over her test results with her in detail today. Her baseline sleep study from 11/10/2016 showed a sleep efficiency of 73.4%, sleep latency 32.5 minutes and REM sleep was absent. She had an increased percentage of stage  I sleep, a mildly increased percentage of slow-wave sleep and absence of REM sleep. She had a total AHI of 7.4 per hour, supine AHI of 48.8 per hour, average oxygen saturation was 96%, nadir was 87%. She had no significant PLMS. The absence of REM sleep during the study likely underestimated her AHI and O2 nadir. Based on her test results and sleep related complaints I invited her for a full night CPAP titration study. She had this on 11/12/2016. Sleep efficiency was 46.4%, sleep  latency prolonged at 153 minutes, REM sleep was absent. She was fitted with a medium nasal pillows interface and CPAP was titrated from 5 cm to 9 cm. On a pressure of 8 cm her AHI was 1.9 per hour, supine sleep was achieved but no REM sleep. O2 nadir was 91%. Based on her test results are prescribed CPAP therapy for home use.   I reviewed her CPAP compliance data from 12/29/2016 through 01/27/2017 which is a total of 30 days, during which time she used her CPAP every night with percent used days greater than 4 hours at 93%, indicating excellent compliance with an average usage of 6 hours and 42 minutes, residual AHI borderline at 6.1 per hour, leak on the higher side for the 95th percentile at 18 L/m on a pressure of 8 cm with EPR.    10/29/2016: She reports snoring and excessive daytime somnolence. I reviewed your office note from 08/07/2016, which you kindly included. Her Epworth sleepiness score is 11 out of 24 today, her fatigue score is 38 out of 63. She is divorced for years. She lives alone. She is retired. She has 1 son and 2 grandchildren. She is a nonsmoker but was exposed to secondhand smoke, drinks alcohol occasionally, and drinks usually 1 cup of coffee per day and occasional diet soda. She was recently observed by her son to have loud snoring and apneic pauses while asleep when they went on a volleyball tournament. She has been told by friends when she shared a room that sleep was very interrupted and they got concerning due to her loud snoring. She denies nocturnal or morning headaches. 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 She denies night to night nocturia. Is typically in bed between 10 and 11 and wake up time is around 6:30, she likes to go for morning water walking exercises and also does yoga. She lives alone with one small dog that sleeps in her queen size bed with her. She is a restless  and light sleeper.  Her Past Medical History Is Significant For: Past Medical History:  Diagnosis Date  . Atrophic vaginitis   . breast cancer 2014  . Breast lobular neoplasia    atypical  . CHF (congestive heart failure) ([place])   . Fibrocystic breast changes   . Heart murmur    grade 1  per patient -echo 7/14  . Hyperlipidemia   . Knee pain, bilateral   . 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 Microcalcifications of the breast    atypical ductal hyperplasia with calcifications  . Osteopenia    Dr Toney Rakes  . Osteopenia   . PMB (postmenopausal bleeding)    Dr Toney Rakes  . Seasonal allergies   . Sleep apnea    cpap  . Vitamin D deficiency   . Wears glasses     Her Past Surgical History Is Significant For: Past Surgical History:  Procedure Laterality Date  . ABDOMINAL HYSTERECTOMY N/A 03/02/2013   Procedure: TOTAL HYSTERECTOMY ABDOMINAL;  Surgeon: Imagene Gurney A. Alycia Rossetti, MD;  Location: [facility];  Service: Gynecology;  Laterality: N/A;  . AXILLARY LYMPH NODE BIOPSY Left 04/08/2013   Procedure: AXILLARY LYMPH NODE BIOPSY and axillary lymph node disection;  Surgeon: [name], MD;  Location: [place];  Service: General;  Laterality: Left;  nuclear medicine injection   . BREAST BIOPSY  01/09/2012   Procedure: BREAST BIOPSY WITH NEEDLE LOCALIZATION;  Surgeon: [name], MD;  Location: [place];  Service: General;  Laterality: Right;  . CESAREAN SECTION  1976  . COLONOSCOPY  [id number], [id number]   polyps 2013 only, Dr. Fuller Plan  . DILATION AND CURETTAGE OF UTERUS    . ENDOMETRIAL BIOPSY  2002   Uterine  polyps, Dr. Cherylann Banas  . HYSTEROSCOPY    . PORT-A-CATH REMOVAL Right 01/02/2015   Procedure: REMOVAL PORT-A-CATH;  Surgeon: Armandina Gemma, MD;  Location: [place];  Service: General;  Laterality: Right;  . PORTACATH PLACEMENT Right 05/04/2013   Procedure: INSERTION PORT-A-CATH;  Surgeon: [name], MD;  Location: [place];  Service: General;  Laterality: Right;  . SALPINGOOPHORECTOMY Bilateral 03/02/2013   Procedure: BILATERAL SALPINGO OOPHORECTOMY;  Surgeon: Imagene Gurney A. Alycia Rossetti, MD;  Location: [facility];  Service: Gynecology;  Laterality: Bilateral;  . TOTAL MASTECTOMY Bilateral 04/08/2013   Procedure: TOTAL MASTECTOMY;  Surgeon: [name], MD;  Location: [place];  Service: General;  Laterality: Bilateral;  needs bed for over night stay    Her Family History Is Significant For: Family History  Problem Relation Age of Onset  . Alcohol abuse Mother   . Cancer Father        bone marrow cancer  . Alcohol abuse Father   . Von Willebrand disease Father   . Depression Son        Recovering alcoholic  . Depression Maternal Aunt   . Prostate cancer Maternal Uncle   . Diabetes Paternal Uncle   . Heart attack Paternal Uncle 26  . Cancer Paternal Grandmother        skin cancer  . Heart failure Paternal Grandmother        in 51s  . GER disease Paternal Grandmother        esophageal stricture  . GER disease Son   . Colon cancer Neg Hx   . Stomach cancer Neg Hx   . Stroke Neg Hx     Her Social History Is Significant For: Social History   Socioeconomic History  . Marital status: Divorced    Spouse name: Not on file  . Number of children: Not on file  . Years of education: Not on file  . Highest education level: Not on file  Occupational History  . Not on file  Social Needs  . Financial resource strain: Not on file  . Food insecurity:    Worry: Not on file    Inability: Not on file  . Transportation needs:    Medical: Not on file    Non-medical: Not on file  Tobacco Use  . Smoking status: Never Smoker  . Smokeless tobacco: Never Used  . Tobacco comment: second hand smoke for decades  Substance and Sexual Activity  . Alcohol use: Yes    Alcohol/week: 2.0 standard drinks    Types: 2 Glasses of wine per week    Comment: socially 1 - 2 glasses of  wine per week  . Drug use:  No  . Sexual activity: Never    Birth control/protection: Post-menopausal  Lifestyle  . Physical activity:    Days per week: Not on file    Minutes per session: Not on file  . Stress: Not on file  Relationships  . Social connections:    Talks on phone: Not on file    Gets together: Not on file    Attends religious service: Not on file    Active member of club or organization: Not on file    Attends meetings of clubs or organizations: Not on file    Relationship status: Not on file  Other Topics Concern  . Not on file  Social History Narrative  . Not on file    Her Allergies Are:  Allergies  Allergen Reactions  . Codeine Rash    Post C section  . Oxycodone Itching  :   Her Current Medications Are:  Outpatient Encounter Medications as of 08/04/2018  Medication Sig  . anastrozole (ARIMIDEX) 1 MG tablet Take 1 tablet (1 mg total) by mouth daily.  Marland Kitchen BIOTIN PO Take 5,000 mcg by mouth daily.  Marland Kitchen buPROPion (WELLBUTRIN XL) 150 MG 24 hr tablet Take 150 mg by mouth daily.  . cholecalciferol (VITAMIN D) 1000 UNITS tablet Take 1,000 Units by mouth daily.  . cycloSPORINE (RESTASIS) 0.05 % ophthalmic emulsion Place 1 drop into both eyes 2 (two) times daily.  . DULoxetine (CYMBALTA) 60 MG capsule Take 60 mg by mouth daily.  . fluticasone (FLONASE) 50 MCG/ACT nasal spray Place 2 sprays into the nose daily as needed for rhinitis or allergies.  . Magnesium 500 MG TABS Take 250 mg by mouth 2 (two) times daily.   . meloxicam (MOBIC) 7.5 MG tablet Take 7.5 mg by mouth 2 (two) times daily.  . OMEGA-3 FATTY ACIDS PO Take by mouth.  Vladimir Faster Glycol-Propyl Glycol (SYSTANE) 0.4-0.3 % SOLN Apply to eye. 2-3 x daily   . Probiotic Product (ALIGN PO) Take by mouth.  . zoledronic acid (RECLAST) 5 MG/100ML SOLN injection Inject into the vein.  . [DISCONTINUED] Calcium 250 MG CAPS Take 1 capsule by mouth daily. 500 mg Calcium tab  . [DISCONTINUED] meloxicam (MOBIC) 15 MG tablet Take 1 tablet (15 mg  total) by mouth daily. (Patient taking differently: Take 7.5 mg by mouth daily. )  . [DISCONTINUED] methocarbamol (ROBAXIN) 500 MG tablet Take 1 tablet (500 mg total) by mouth every 6 (six) hours as needed for muscle spasms.  . [DISCONTINUED] methylPREDNISolone (MEDROL) 4 MG tablet Medrol dose pack. Take as instructed  . [DISCONTINUED] Omeprazole (PRILOSEC PO) Take every morning by mouth.   Facility-Administered Encounter Medications as of 08/04/2018  Medication  . 0.9 %  sodium chloride infusion  :  Review of Systems:  Out of a complete 14 point review of systems, all are reviewed and negative with the exception of these symptoms as listed below: Review of Systems  Neurological:       Pt presents today to discuss her cpap. Pt didn't like the nasal mask because it leaked.    Objective:  Neurological Exam  Physical Exam Physical Examination:   Vitals:   08/04/18 1034  BP: 121/73  Pulse: 62    General Examination: The patient is a very pleasant 72 y.o. female in no acute distress. She appears well-developed and well-nourished and well groomed.   HEENT:Normocephalic, atraumatic, pupils are equal, round and reactive to light and accommodation. Mild bilateral cataracts  are noted. Corrective eyeglasses in place. Extraocular tracking is good without limitation to gaze excursion or nystagmus noted. Normal smooth pursuit is noted. Hearing is grossly intact. Face is symmetric with normal facial animation and normal facial sensation. Speech is clear with no dysarthria noted. There is no hypophonia. There is no lip, neck/head, jaw or voice tremor. Neck shows FROM. Oropharynx exam reveals: mildmouth dryness, gooddental hygiene and mildairway crowding. Tongue protrudes centrally and palate elevates symmetrically. Tonsils are 1+ in size.   Chest:Clear to auscultation without wheezing, rhonchi or crackles noted.  Heart:S1+S2+0, regular and normal without murmurs, rubs or gallops noted.    Abdomen:Soft, non-tender and non-distended with normal bowel sounds appreciated on auscultation.  Extremities:There is nopitting edema in the distal lower extremities bilaterally.  Skin: Warm and dry without trophic changes noted.  Musculoskeletal: exam reveals no obvious joint deformities, tenderness or joint swelling or erythema.   Neurologically:  Mental status: The patient is awake, alert and oriented in all 4 spheres. Herimmediate and remote memory, attention, language skills and fund of knowledge are appropriate. There is no evidence of aphasia, agnosia, apraxia or anomia. Speech is clear with normal prosody and enunciation. Thought process is linear. Mood is normaland affect is normal.  Cranial nerves II - XII are as described above under HEENT exam. Motor exam: Normal bulk, strength and tone is noted. There is no drift,restingtremor. Fine motor skills and coordination:grosslyintact.  Cerebellar testing: No dysmetria or intention tremor. There is no truncal or gait ataxia.  Sensory exam: intact to light touch the upper and lower extremities.  Gait, station and balance: Shestands easily. No veering to one side is noted. No leaning to one side is noted. Posture is age-appropriate and stance is narrow based. Gait shows normalstride length and normalpace. No problems turning are noted.   Assessment and Plan:  In summary, Emma G Rayis a very pleasant 72 year old female with an underlying medical history of left-sided breast cancer, status post bilateralmastectomies, s/pchemotherapy, osteopenia, CHF(deemed secondary to chemo with subsequent improvement in EF), impaired fasting glucose, history of depression, vitamin D deficiency, and obesity, who presents for follow-up consultation of her obstructive sleep apnea, established on CPAP therapy. She had a baseline sleep study, followed by a CPAP titration study in February 2018. She has Had some recent lapses and treatment.  Overall, she is consistent with treatment but had recent issues with her mask. She has been up-to-date with her supplies, and would like to look into getting a travel CPAP. She is advised that her insurance may not cover a second CPAP machine but she is welcome to look into this with her DME provider. I placed an order for this. She is advised to follow-up routinely in one year. She has been working on weight loss and compared to last year has indeed lost about 11 pounds. She is commended for her weight loss endeavor. She is advised to follow-up with one of our nurse practitioners in 1 year routinely. I answered all her questions today and she was in agreement. I spent 20 minutes in total face-to-face time with the patient, more than 50% of which was spent in counseling and coordination of care, reviewing test results, reviewing medication and discussing or reviewing the diagnosis of OSA, its prognosis and treatment options. Pertinent laboratory and imaging test results that were available during this visit with the patient were reviewed by me and considered in my medical decision making (see chart for details).

## 2018-08-04 NOTE — Progress Notes (Signed)
Order for travel cpap sent to [facility] via community message sent to [facility]. Confirmation received that the order transmitted was successful.

## 2018-08-04 NOTE — Patient Instructions (Addendum)
Please continue using your CPAP regularly. While your insurance requires that you use CPAP at least 4 hours each night on 70% of the nights, I recommend, that you not skip any nights and use it throughout the night if you can. Getting used to CPAP and staying with the treatment long term does take time and patience and discipline. Untreated obstructive sleep apnea when it is moderate to severe can have an adverse impact on cardiovascular health and raise her risk for heart disease, arrhythmias, hypertension, congestive heart failure, stroke and diabetes. Untreated obstructive sleep apnea causes sleep disruption, nonrestorative sleep, and sleep deprivation. This can have an impact on your day to day functioning and cause daytime sleepiness and impairment of cognitive function, memory loss, mood disturbance, and problems focussing. Using CPAP regularly can improve these symptoms.  Keep up the good work! We can see you in 1 year, you can see one of our nurse practitioners as you are stable. I will see you after that.   I have prescribed a travel CPAP for you, please get in touch with [facility] about it.

## 2018-09-04 ENCOUNTER — Encounter (INDEPENDENT_AMBULATORY_CARE_PROVIDER_SITE_OTHER): Payer: Self-pay | Admitting: Orthopaedic Surgery

## 2018-09-04 DIAGNOSIS — M4317 Spondylolisthesis, lumbosacral region: Secondary | ICD-10-CM

## 2018-09-04 DIAGNOSIS — M79604 Pain in right leg: Secondary | ICD-10-CM

## 2018-09-07 ENCOUNTER — Telehealth: Payer: Self-pay

## 2018-09-07 NOTE — Telephone Encounter (Signed)
Pt called in, asked to speak with me, I took the phone call. Pt reports that she does not want to use [facility] to get her travel cpap and would like to order it online. She is asking to pick up a copy of the RX. I will place it at the front desk for her. I gave her clinic hours. Pt verbalized understanding and appreciation.

## 2018-09-17 ENCOUNTER — Other Ambulatory Visit (HOSPITAL_COMMUNITY): Payer: Self-pay | Admitting: *Deleted

## 2018-09-18 ENCOUNTER — Ambulatory Visit (HOSPITAL_COMMUNITY)
Admission: RE | Admit: 2018-09-18 | Discharge: 2018-09-18 | Disposition: A | Discharge status: Home / Self Care | Payer: Medicare Other | Source: Ambulatory Visit | Attending: Internal Medicine | Admitting: Internal Medicine

## 2018-09-18 DIAGNOSIS — M810 Age-related osteoporosis without current pathological fracture: Secondary | ICD-10-CM | POA: Diagnosis present

## 2018-09-18 MED ORDER — ZOLEDRONIC ACID 5 MG/100ML IV SOLN
5.0000 mg | Freq: Once | INTRAVENOUS | Status: AC
  Administered 2018-09-18: 5 mg via INTRAVENOUS

## 2018-09-18 MED ORDER — ZOLEDRONIC ACID 5 MG/100ML IV SOLN
INTRAVENOUS | Status: AC
  Administered 2018-09-18: 10:00:00 5 mg via INTRAVENOUS
  Filled 2018-09-18: qty 100

## 2018-09-25 NOTE — Progress Notes (Signed)
[name]  [phone] [phone]     ID: [name] DOB: 10-May-1946  MR#: [id number]  [name]  Patient Care Team: Emma Redwood, MD as PCP - General (Internal Medicine) [name], Emma Dad, MD as Consulting Physician (Oncology) Emma Rossetti, MD as Consulting Physician (Orthopedic Surgery) Emma, Emma Goltz, MD as Consulting Physician (Hematology and Oncology) Emma Mins, MD as Consulting Physician (Surgical Oncology) Emma Gemma, MD as Consulting Physician (General Surgery) Emma Bookbinder, MD as Consulting Physician (Dermatology) OTHER MD:   CHIEF COMPLAINT: Estrogen receptor positive breast cancer   CURRENT TREATMENT: Anastrozole; zoledronate   HISTORY OF CURRENT ILLNESS: [name] underwent bilateral diagnostic mammography with tomography and left breast ultrasonography at [facility] on 02/15/2013 and 02/16/2013 respectively showing: a spiculated mass in the left breast at 2 o'clock as well as a second asymmetry at 12 o'clock, both of which correspond to hypoechoic masses on ultrasound. These two masses were biopsied under ultrasound guidance and shown to reflect carcinoma. Within the right breast, there is architectural distortion at 12 o'clock corresponding to a scar marker.   In addition, she also underwent a breast MRI on 02/24/2013 showing: 2 masses representing biopsy-proven cancer in the left breast as well as adjacent areas of suspicious nonmass enhancement. The overall extent of suspicious enhancement (including the masses and nonmass enhancement) is over 5 cm. Within the upper inner right breast at 12-1 o'clock, there is an enhancing mass which a central signal void, corresponding to the area of prior excisional biopsy. This was subsequently biopsied under MR guidance on 03/10/2013 with pathology results revealing fat necrosis.  She underwent a biopsy of the left breast area in question. Pathology from this  procedure showed ([id number]): invasive mammary carcinoma and an associated mammary carcinoma in situ in the left breast at 2 o'clock, compatible with a grade II carcinoma. Prognostic indicators significant for: estrogen receptor, 100% positive with strong staining intensity and progesterone receptor, 69% positive with moderate staining intensity. Proliferation marker Ki67 at 19%. HER2 positive by chromogenic in situ hybridization with a signals ratio 3.00 and number per cell 4.20.    A second biopsy showed ([id number]): invasive mammary carcinoma and an associated mammary carcinoma in situ in the left breast at 12 o'clock, compatible with a grade II carcinoma. Prognostic indicators significant for: estrogen receptor, 100% positive and progesterone receptor with strong staining intensity, 71% positive with moderate staining intensity. Proliferation marker Ki67 at 34%. HER2 negative by chromogenic in situ hybridization with a signals ratio 1.45 and number per cell 3.40.   She underwent a hysterectomy with bilateral salpingo oophorectomy on 03/02/2013. The pathology from this procedure showed ([id number]):   1. 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   2. Uterus and cervix, with left tube and ovary   - Leiomyomata and adenomyosis   - Endometrium: benign endometrial polyp and adjacent benign proliferative endometrium, no atypia, hyperplasia or malignancy.    - Cervix: benign squamous mucosa and endocervical mucosa, no dysplasia or malignancy.   - Left ovary: ovarian fibroma, 3.2 cm, no atypia or malignancy.    - Fallopian tube: benign fallopian tube tissue, no evidence of endometriosis, atypia, or malignancy.   [name] underwent a bilateral mastectomy with Dr. Harlow Malone on 04/23/2013. The pathology from this procedure showed ([id number]):   1. Breast, simple  mastectomy, right: breast tissue with fibrocystic  changes and calcifications. Previous biopsy site. No malignancy identified.  2. Lymph node, sentinel, biopsy, left axilla #1: one lymph node positive for metastatic carcinoma with extracapsular extension (1/1).  3. Lymph node, sentinel, biopsy, left axilla #2: one lymph node negative for tumor (0/1).  4. Lymph node, sentinel, biopsy, left axilla #3: one lymph node negative for tumor (0/1).  5. Breast, simple mastectomy, left: invasive and in situ ductal carcinoma. The invasive ductal carcinoma is elston grade ii/iii, multifocal with two foci measuring 2.2 and 1.7 cm. The ductal carcinoma in situ is cribriform and comedo types, nuclear grade 2 with associated calcifications.   - Lymphovascular invasion is identified.   - Perineural invasion is identified.   - Surgical resection margins are negative for carcinoma.  6. Lymph nodes, regional resection, left axilla: one of eleven axillary lymph nodes positive for metastatic carcinoma (2/14 total).  The patient's subsequent history is as detailed below.   INTERVAL HISTORY: [name] was evaluated in the breast cancer clinic on 09/28/2018.  She is establishing herself in my service today, changing from [facility] chiefly because of driving concerns.  [name]'s last bone density screening on 02/25/2017, showed a T-score of -1.9, which is considered osteopenic.    REVIEW OF SYSTEMS: [name] denies unusual headaches, visual changes, nausea, vomiting, stiff neck, dizziness, or gait imbalance. There has been no cough, phlegm production, or pleurisy, no chest pain or pressure, and no change in bowel or bladder habits. The patient denies fever, rash, bleeding, unexplained fatigue or unexplained weight loss. A detailed review of systems was otherwise entirely negative.   PAST MEDICAL HISTORY: Past Medical History:  Diagnosis Date  . Atrophic vaginitis   . breast cancer 2014  . Breast lobular neoplasia     atypical  . CHF (congestive heart failure) ([place])   . Fibrocystic breast changes   . Heart murmur    grade 1  per patient -echo 7/14  . Hyperlipidemia   . Knee pain, bilateral   . 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 Microcalcifications of the breast    atypical ductal hyperplasia with calcifications  . Osteopenia    Dr Toney Rakes  . Osteopenia   . PMB (postmenopausal bleeding)    Dr Toney Rakes  . Seasonal allergies   . Sleep apnea    cpap  . Vitamin D deficiency   . Wears glasses      PAST SURGICAL HISTORY: Past Surgical History:  Procedure Laterality Date  . ABDOMINAL HYSTERECTOMY N/A 03/02/2013   Procedure: TOTAL HYSTERECTOMY ABDOMINAL;  Surgeon: Imagene Gurney A. Alycia Rossetti, MD;  Location: [facility];  Service: Gynecology;  Laterality: N/A;  . AXILLARY LYMPH NODE BIOPSY Left 04/08/2013   Procedure: AXILLARY LYMPH NODE BIOPSY and axillary lymph node disection;  Surgeon: [name], MD;  Location: [place];  Service: General;  Laterality: Left;  nuclear medicine injection   . BREAST BIOPSY  01/09/2012   Procedure: BREAST BIOPSY WITH NEEDLE LOCALIZATION;  Surgeon: [name], MD;  Location: [place];  Service: General;  Laterality: Right;  . CESAREAN SECTION  1976  . COLONOSCOPY  [id number], [id number]   polyps 2013 only, Dr. Fuller Plan  . DILATION AND CURETTAGE OF UTERUS    . ENDOMETRIAL BIOPSY  2002   Uterine  polyps, Dr. Cherylann Banas  . HYSTEROSCOPY    . PORT-A-CATH REMOVAL Right 01/02/2015   Procedure: REMOVAL PORT-A-CATH;  Surgeon: Emma Gemma, MD;  Location: [place];  Service: General;  Laterality: Right;  .  PORTACATH PLACEMENT Right 05/04/2013   Procedure: INSERTION PORT-A-CATH;  Surgeon: [name], MD;  Location: [place];  Service: General;  Laterality: Right;  . SALPINGOOPHORECTOMY Bilateral 03/02/2013   Procedure: BILATERAL SALPINGO OOPHORECTOMY;  Surgeon: Imagene Gurney A. Alycia Rossetti, MD;  Location: [facility];  Service:  Gynecology;  Laterality: Bilateral;  . TOTAL MASTECTOMY Bilateral 04/08/2013   Procedure: TOTAL MASTECTOMY;  Surgeon: [name], MD;  Location: [place];  Service: General;  Laterality: Bilateral;  needs bed for over night stay     FAMILY HISTORY: Family History  Problem Relation Age of Onset  . Alcohol abuse Mother   . Cancer Father        bone marrow cancer  . Alcohol abuse Father   . Von Willebrand disease Father   . Depression Son        Recovering alcoholic  . Depression Maternal Aunt   . Prostate cancer Maternal Uncle   . Diabetes Paternal Uncle   . Heart attack Paternal Uncle 26  . Cancer Paternal Grandmother        skin cancer  . Heart failure Paternal Grandmother        in 16s  . GER disease Paternal Grandmother        esophageal stricture  . GER disease Son   . Colon cancer Neg Hx   . Stomach cancer Neg Hx   . Stroke Neg Hx    [name]'s father died from "cancer of the bone marrow" at age 57--possibly myelodysplasia since he had some radiation exposure as a marine in [place].. Patients' mother died due to complications with alcoholism at age 75. The patient has 2 sisters. [name]'s middle sister was diagnosed with breast cancer at the age of 20. [name]'s uncle had prostate cancer. Patient denies anyone in her family having ovarian cancer.   GYNECOLOGIC HISTORY:  No LMP recorded. Patient is postmenopausal. 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 These days, she spends her time playing bridge, quilting, and spending time with her grandchildren. Asya is divorced and lives by herself. She has one child, [name], who works in Scientist, research (life sciences) estate here in [place]. [name] has two grandchildren who are 40 and 40 (as of 09/2018). She attends [employer].   ADVANCED DIRECTIVES: Her son, [name], is her healthcare power of attorney.     HEALTH MAINTENANCE: Social History   Tobacco Use  . Smoking status: Never Smoker  . Smokeless tobacco: Never Used  . Tobacco comment: second hand smoke for decades  Substance Use Topics  . Alcohol use: Yes    Alcohol/week: 2.0 standard drinks    Types: 2 Glasses of wine per week    Comment: socially 1 - 2 glasses of wine per week  . Drug use: No     Colonoscopy: 03/25/2017/ Fuller Plan  PAP: Status post hysterectomy  Bone density:T -1.9; 02/25/2017 at [facility]   Allergies  Allergen Reactions  . Codeine Rash    Post C section  . Oxycodone Itching    Current Outpatient Medications  Medication Sig Dispense Refill  . anastrozole (ARIMIDEX) 1 MG tablet Take 1 tablet (1 mg total) by mouth daily. 30 tablet 2  . BIOTIN PO Take 5,000 mcg by mouth daily.    Marland Kitchen  buPROPion (WELLBUTRIN XL) 150 MG 24 hr tablet Take 150 mg by mouth daily.  11  . cholecalciferol (VITAMIN D) 1000 UNITS tablet Take 2,000 Units by mouth daily.     . cycloSPORINE (RESTASIS) 0.05 % ophthalmic emulsion Place 1 drop into both eyes 2 (two) times daily.    . DULoxetine (CYMBALTA) 60 MG capsule Take 60 mg by mouth daily.    . fluticasone (FLONASE) 50 MCG/ACT nasal spray Place 2 sprays into the nose daily as needed for rhinitis or allergies.    . meloxicam (MOBIC) 7.5 MG tablet Take 7.5 mg by mouth 2 (two) times daily.  5  . OMEGA-3 FATTY ACIDS PO Take by mouth.    . Probiotic Product (ALIGN PO) Take by mouth.    . zoledronic acid (RECLAST) 5 MG/100ML SOLN injection Inject into the vein.     Current Facility-Administered Medications  Medication Dose Route Frequency Provider Last Rate Last Dose  . 0.9 %  sodium chloride infusion  500 mL Intravenous Continuous Ladene Artist, MD         OBJECTIVE: Middle-aged white woman in no acute distress  Vitals:   09/28/18 1540  BP: 131/63  Pulse: 88  Resp: 18    Temp: 98 F (36.7 C)  SpO2: 98%     Body mass index is 32.7 kg/m.   Wt Readings from Last 3 Encounters:  09/28/18 190 lb 8 oz (86.4 kg)  09/18/18 185 lb (83.9 kg)  08/04/18 190 lb (86.2 kg)      ECOG FS:0 - Asymptomatic  Ocular: Sclerae unicteric, pupils round and equal Ear-nose-throat: Oropharynx clear and moist, dentition in good repair Lymphatic: No cervical or supraclavicular adenopathy Lungs no rales or rhonchi Heart regular rate and rhythm, I do not appreciate a murmur Abd soft, nontender, positive bowel sounds MSK no focal spinal tenderness, no joint edema Neuro: non-focal, well-oriented, appropriate affect Breasts: Status post bilateral mastectomies.  There is no evidence of chest wall recurrence.  Both axillae are benign.   LAB RESULTS:  CMP     Component Value Date/Time   NA 140 07/26/2017 1501   NA 140 03/11/2013 1443   K 3.9 07/26/2017 1501   K 3.8 03/11/2013 1443   CL 106 07/26/2017 1501   CO2 23 07/26/2017 1501   CO2 29 03/11/2013 1443   GLUCOSE 138 (H) 07/26/2017 1501   GLUCOSE 165 (H) 03/11/2013 1443   BUN 21 (H) 07/26/2017 1501   BUN 12.9 03/11/2013 1443   CREATININE 1.08 (H) 07/26/2017 1501   CREATININE 0.8 03/11/2013 1443   CALCIUM 9.5 07/26/2017 1501   CALCIUM 9.4 03/11/2013 1443   PROT 7.3 07/26/2017 1501   PROT 7.1 03/11/2013 1443   ALBUMIN 4.1 07/26/2017 1501   ALBUMIN 3.4 (L) 03/11/2013 1443   AST 26 07/26/2017 1501   AST 14 03/11/2013 1443   ALT 17 07/26/2017 1501   ALT 10 03/11/2013 1443   ALKPHOS 93 07/26/2017 1501   ALKPHOS 83 03/11/2013 1443   BILITOT 0.7 07/26/2017 1501   BILITOT 0.52 03/11/2013 1443   GFRNONAA 50 (L) 07/26/2017 1501   GFRAA 58 (L) 07/26/2017 1501    No results found for: TOTALPROTELP, ALBUMINELP, A1GS, A2GS, BETS, BETA2SER, GAMS, MSPIKE, SPEI  No results found for: KPAFRELGTCHN, LAMBDASER, KAPLAMBRATIO  Lab Results  Component Value Date   WBC 10.7 (H) 07/26/2017   NEUTROABS 8.9 (H) 07/26/2017   HGB  15.1 (H) 07/26/2017   HCT 43.8 07/26/2017   MCV 88.5 07/26/2017  PLT 153 07/26/2017    '@LASTCHEMISTRY' @  No results found for: LABCA2  No components found for: EHMCNO709  No results for input(s): INR in the last 168 hours.  No results found for: LABCA2  No results found for: GGE366  No results found for: QHU765  No results found for: YYT035  No results found for: CA2729  No components found for: HGQUANT  No results found for: CEA1 / No results found for: CEA1   No results found for: AFPTUMOR  No results found for: CHROMOGRNA  No results found for: PSA1  No visits with results within 3 Day(s) from this visit.  Latest known visit with results is:  Admission on 07/26/2017, Discharged on 07/26/2017  Component Date Value Ref Range Status  . Color, Urine 07/26/2017 YELLOW  YELLOW Final  . APPearance 07/26/2017 HAZY* CLEAR Final  . Specific Gravity, Urine 07/26/2017 1.019  1.005 - 1.030 Final  . pH 07/26/2017 5.0  5.0 - 8.0 Final  . Glucose, UA 07/26/2017 NEGATIVE  NEGATIVE mg/dL Final  . Hgb urine dipstick 07/26/2017 MODERATE* NEGATIVE Final  . Bilirubin Urine 07/26/2017 NEGATIVE  NEGATIVE Final  . Ketones, ur 07/26/2017 5* NEGATIVE mg/dL Final  . Protein, ur 07/26/2017 NEGATIVE  NEGATIVE mg/dL Final  . Nitrite 07/26/2017 NEGATIVE  NEGATIVE Final  . Leukocytes, UA 07/26/2017 TRACE* NEGATIVE Final  . RBC / HPF 07/26/2017 6-30  0 - 5 RBC/hpf Final  . WBC, UA 07/26/2017 0-5  0 - 5 WBC/hpf Final  . Bacteria, UA 07/26/2017 FEW* NONE SEEN Final  . Squamous Epithelial / LPF 07/26/2017 0-5* NONE SEEN Final  . Mucus 07/26/2017 PRESENT   Final  . Budding Yeast 07/26/2017 PRESENT   Final  . Hyaline Casts, UA 07/26/2017 PRESENT   Final  . Sodium 07/26/2017 140  135 - 145 mmol/L Final  . Potassium 07/26/2017 3.9  3.5 - 5.1 mmol/L Final  . Chloride 07/26/2017 106  101 - 111 mmol/L Final  . CO2 07/26/2017 23  22 - 32 mmol/L Final  . Glucose, Bld 07/26/2017 138* 65 - 99 mg/dL  Final  . BUN 07/26/2017 21* 6 - 20 mg/dL Final  . Creatinine, Ser 07/26/2017 1.08* 0.44 - 1.00 mg/dL Final  . Calcium 07/26/2017 9.5  8.9 - 10.3 mg/dL Final  . Total Protein 07/26/2017 7.3  6.5 - 8.1 g/dL Final  . Albumin 07/26/2017 4.1  3.5 - 5.0 g/dL Final  . AST 07/26/2017 26  15 - 41 U/L Final  . ALT 07/26/2017 17  14 - 54 U/L Final  . Alkaline Phosphatase 07/26/2017 93  38 - 126 U/L Final  . Total Bilirubin 07/26/2017 0.7  0.3 - 1.2 mg/dL Final  . GFR calc non Af Amer 07/26/2017 50* >60 mL/min Final  . GFR calc Af Amer 07/26/2017 58* >60 mL/min Final   Comment: (NOTE) The eGFR has been calculated using the CKD EPI equation. This calculation has not been validated in all clinical situations. eGFR's persistently <60 mL/min signify possible Chronic Kidney Disease.   . Anion gap 07/26/2017 11  5 - 15 Final  . Lipase 07/26/2017 33  11 - 51 U/L Final  . WBC 07/26/2017 10.7* 4.0 - 10.5 K/uL Final  . RBC 07/26/2017 4.95  3.87 - 5.11 MIL/uL Final  . Hemoglobin 07/26/2017 15.1* 12.0 - 15.0 g/dL Final  . HCT 07/26/2017 43.8  36.0 - 46.0 % Final  . MCV 07/26/2017 88.5  78.0 - 100.0 fL Final  . MCH 07/26/2017 30.5  26.0 - 34.0  pg Final  . MCHC 07/26/2017 34.5  30.0 - 36.0 g/dL Final  . RDW 07/26/2017 12.9  11.5 - 15.5 % Final  . Platelets 07/26/2017 153  150 - 400 K/uL Final  . Neutrophils Relative % 07/26/2017 83  % Final  . Neutro Abs 07/26/2017 8.9* 1.7 - 7.7 K/uL Final  . Lymphocytes Relative 07/26/2017 10  % Final  . Lymphs Abs 07/26/2017 1.0  0.7 - 4.0 K/uL Final  . Monocytes Relative 07/26/2017 5  % Final  . Monocytes Absolute 07/26/2017 0.6  0.1 - 1.0 K/uL Final  . Eosinophils Relative 07/26/2017 2  % Final  . Eosinophils Absolute 07/26/2017 0.2  0.0 - 0.7 K/uL Final  . Basophils Relative 07/26/2017 0  % Final  . Basophils Absolute 07/26/2017 0.0  0.0 - 0.1 K/uL Final    (this displays the last labs from the last 3 days)  No results found for: TOTALPROTELP, ALBUMINELP, A1GS,  A2GS, BETS, BETA2SER, GAMS, MSPIKE, SPEI (this displays SPEP labs)  No results found for: KPAFRELGTCHN, LAMBDASER, KAPLAMBRATIO (kappa/lambda light chains)  No results found for: HGBA, HGBA2QUANT, HGBFQUANT, HGBSQUAN (Hemoglobinopathy evaluation)   No results found for: LDH  No results found for: IRON, TIBC, IRONPCTSAT (Iron and TIBC)  No results found for: FERRITIN  Urinalysis    Component Value Date/Time   COLORURINE YELLOW 07/26/2017 1411   APPEARANCEUR HAZY (A) 07/26/2017 1411   LABSPEC 1.019 07/26/2017 1411   PHURINE 5.0 07/26/2017 1411   GLUCOSEU NEGATIVE 07/26/2017 1411   HGBUR MODERATE (A) 07/26/2017 1411   BILIRUBINUR NEGATIVE 07/26/2017 1411   KETONESUR 5 (A) 07/26/2017 1411   PROTEINUR NEGATIVE 07/26/2017 1411   UROBILINOGEN 0.2 01/08/2012 1047   NITRITE NEGATIVE 07/26/2017 1411   LEUKOCYTESUR TRACE (A) 07/26/2017 1411     STUDIES:  Outside records reviewed  ELIGIBLE FOR AVAILABLE RESEARCH PROTOCOL: no   ASSESSMENT: 73 y.o.  [place] woman status post bilateral mastectomies 04/23/2013, showing  (1) on the right, no evidence of malignancy (prior biopsy showed ADH)  (2) on the left, an mpT2 pN1, stage IB invasive ductal carcinoma, grade 2, estrogen and progesterone receptor positive, with an MIB-1 between 69 and 71%, with negative margins  (a) 1 of the 2 tumors sampled (at 12:00) was HER-2 positive with a signals ratio of 3.00; the second tumor was HER-2 negative with a ratio of 1.45.  (b) 2 of 14 left axillary lymph nodes were involved with macrometastatic deposits  (3) treated adjuvantly between 05/28/2013 and 07/30/2013 with cyclophosphamide and doxorubicin given every 21 days x 4, followed by paclitaxel every 21 days x 4 given with trastuzumab, started 08/20/2013, completing 4 cycles of paclitaxel (dose reduced) February 2015  (4) trastuzumab continued to total 1 year (February 2016)  (a) trastuzumab held August-September 2014 due to small drop in  ejection fraction; resumed October 2014  (5) anastrozole started February 2015  (a) osteopenia with a T score of -1.9 on bone density scan 02/25/2017  (b) status post alendronate; discontinued secondary to reflux)  (c) currently on yearly zoledronate through Dr. Raul Del office    PLAN: I spent approximately 50 minutes face to face with Rada G Sledge with more than 50% of that time spent in counseling and coordination of care. Specifically we reviewed the biology of the patient's diagnosis and the specifics of her situation.  She understands the staging system for breast cancer changed in 2018 and her tumor now would be classified as 1B.  This indicates a better prognosis than would have  been anticipated originally.  The reason for this is easy to see if one considers that triple positive breast cancers can be treated with chemotherapy, anti-HER-2 treatment, and antiestrogens, and the result of that is what ever the original risk of recurrence after local treatment is, call it X, after the treatment the risk will be reduced to 1/6 of X.  Accordingly if her original risk of recurrence after surgery was in the 50% range, which I think would be reasonable, that risk would be now in the 8% range.  If she wishes to reduce the risk further, she can continue anastrozole to a total of 7 years, 2 years more than currently.  That will further reduce her risk of recurrence by approximately 2-3%, bringing it to less than 5%.  Zoledronate, which she receives for bone density issues, also reduces the risk of recurrence  In short I expect Sinahi will do well long-term as far as her breast cancer is concerned.  The plan will be to continue anastrozole an additional 2 years, to February 2022.  Her bone density issues are being followed and treated through Dr. Raul Del office.    I am comfortable seeing her on a once a year basis, and that will be January 2021.  Keilany has a good understanding of this plan. She  agrees with it. She knows the goal of treatment in her case is cure. She will call with any problems that may develop before her next visit here.   Dayvin Aber, Emma Dad, MD  09/28/18 4:26 PM Medical Oncology and Hematology [facility] [address] [place], [place] [address] Tel. [phone]    Fax. [phone]    I, Jacqualyn Posey am acting as a Education administrator for Chauncey Cruel, MD.   I, Lurline Del MD, have reviewed the above documentation for accuracy and completeness, and I agree with the above.

## 2018-09-28 ENCOUNTER — Inpatient Hospital Stay: Payer: Medicare Other | Attending: Oncology | Admitting: Oncology

## 2018-09-28 ENCOUNTER — Encounter: Payer: Self-pay | Admitting: Oncology

## 2018-09-28 VITALS — BP 131/63 | HR 88 | Temp 98.0°F | Resp 18 | Ht 64.0 in | Wt 190.5 lb

## 2018-09-28 DIAGNOSIS — Z9011 Acquired absence of right breast and nipple: Secondary | ICD-10-CM | POA: Insufficient documentation

## 2018-09-28 DIAGNOSIS — Z90722 Acquired absence of ovaries, bilateral: Secondary | ICD-10-CM | POA: Insufficient documentation

## 2018-09-28 DIAGNOSIS — G4730 Sleep apnea, unspecified: Secondary | ICD-10-CM | POA: Diagnosis not present

## 2018-09-28 DIAGNOSIS — Z79899 Other long term (current) drug therapy: Secondary | ICD-10-CM | POA: Insufficient documentation

## 2018-09-28 DIAGNOSIS — Z90710 Acquired absence of both cervix and uterus: Secondary | ICD-10-CM | POA: Insufficient documentation

## 2018-09-28 DIAGNOSIS — M8580 Other specified disorders of bone density and structure, unspecified site: Secondary | ICD-10-CM | POA: Insufficient documentation

## 2018-09-28 DIAGNOSIS — R011 Cardiac murmur, unspecified: Secondary | ICD-10-CM | POA: Diagnosis not present

## 2018-09-28 DIAGNOSIS — E559 Vitamin D deficiency, unspecified: Secondary | ICD-10-CM | POA: Diagnosis not present

## 2018-09-28 DIAGNOSIS — Z170 Estrogen receptor positive status [ER+]: Secondary | ICD-10-CM

## 2018-09-28 DIAGNOSIS — C50812 Malignant neoplasm of overlapping sites of left female breast: Secondary | ICD-10-CM | POA: Insufficient documentation

## 2018-09-28 DIAGNOSIS — I509 Heart failure, unspecified: Secondary | ICD-10-CM | POA: Insufficient documentation

## 2018-09-28 DIAGNOSIS — E785 Hyperlipidemia, unspecified: Secondary | ICD-10-CM | POA: Diagnosis not present

## 2018-09-29 ENCOUNTER — Ambulatory Visit
Admission: RE | Admit: 2018-09-29 | Discharge: 2018-09-29 | Disposition: A | Discharge status: Home / Self Care | Payer: Medicare Other | Source: Ambulatory Visit | Attending: Orthopaedic Surgery | Admitting: Orthopaedic Surgery

## 2018-09-29 DIAGNOSIS — M4317 Spondylolisthesis, lumbosacral region: Secondary | ICD-10-CM

## 2018-09-29 DIAGNOSIS — M79604 Pain in right leg: Secondary | ICD-10-CM

## 2018-10-04 ENCOUNTER — Encounter (INDEPENDENT_AMBULATORY_CARE_PROVIDER_SITE_OTHER): Payer: Self-pay | Admitting: Orthopaedic Surgery

## 2018-10-08 ENCOUNTER — Encounter (INDEPENDENT_AMBULATORY_CARE_PROVIDER_SITE_OTHER): Payer: Self-pay | Admitting: Orthopaedic Surgery

## 2018-10-15 ENCOUNTER — Ambulatory Visit (INDEPENDENT_AMBULATORY_CARE_PROVIDER_SITE_OTHER): Payer: Medicare Other | Admitting: Physician Assistant

## 2018-10-15 ENCOUNTER — Other Ambulatory Visit (INDEPENDENT_AMBULATORY_CARE_PROVIDER_SITE_OTHER): Payer: Self-pay

## 2018-10-15 ENCOUNTER — Encounter (INDEPENDENT_AMBULATORY_CARE_PROVIDER_SITE_OTHER): Payer: Self-pay | Admitting: Physician Assistant

## 2018-10-15 DIAGNOSIS — M5431 Sciatica, right side: Secondary | ICD-10-CM

## 2018-10-15 NOTE — Progress Notes (Signed)
HPI: [name] returns today to go over the MRI of her lumbar spine.  She is last seen by Dr. Ninfa Malone on 07/13/2018.  She states that her back pain actually is much improved since having undergone steroid Dosepak.  She has been doing some therapy feels this helps some but mainly the Dosepak helped.  She is taken meloxicam.  Said no bowel bladder dysfunction.  She is having no waking pain.  She feels she is 80 to 85% better than she was.  She still cannot walk for long distances due to time tingling in her right foot.  She also has pain in the right buttocks.  Both knees increased with prolonged walking.  Is leaving for trip February 12 and the walking several miles a day is concerned about this due to the pain that she has. MRI lumbar spine dated 09/29/2018 is reviewed with the patient actual images are reviewed the lumbar spine model is used also for further visualization purposes.  This protrusion at L5-S1 on the right contacts the right L5 nerve root.  Also at this level she has multifactorial moderate right foraminal stenosis.  L4-5 5 mm anterior listhesis with advanced facet hypertrophy causing moderate to severe canal and bilateral lateral recess stenosis with mild right and moderate left L4 foraminal narrowing.  L3-4 mild disc bulge without significant canal or lateral recess stenosis.  Mild L3 foraminal narrowing bilaterally.  Review of systems: Please see HPI otherwise negative  Physical exam: General patient is well-developed well-nourished female no acute distress.  She ambulates without any assistive device and a nonantalgic gait.  Impression: Lumbar radiculopathy right leg  Plan: Send her for a lumbar epidural steroid injection hopefully prior to her trip on February 12.  She Dr. Ninfa Malone in 6 weeks to see her response to the injection.  Questions are encouraged and answered at length.  Advised her to continue the exercise program as taught by therapy for core strengthening and stretching.

## 2018-10-20 ENCOUNTER — Telehealth (INDEPENDENT_AMBULATORY_CARE_PROVIDER_SITE_OTHER): Payer: Self-pay | Admitting: *Deleted

## 2018-10-20 ENCOUNTER — Other Ambulatory Visit (INDEPENDENT_AMBULATORY_CARE_PROVIDER_SITE_OTHER): Payer: Self-pay

## 2018-10-20 DIAGNOSIS — M5432 Sciatica, left side: Principal | ICD-10-CM

## 2018-10-20 DIAGNOSIS — M5431 Sciatica, right side: Secondary | ICD-10-CM

## 2018-10-23 ENCOUNTER — Telehealth (INDEPENDENT_AMBULATORY_CARE_PROVIDER_SITE_OTHER): Payer: Self-pay | Admitting: Radiology

## 2018-10-23 NOTE — Telephone Encounter (Signed)
Called pt and advised that we do not have cancellations at this time.

## 2018-10-23 NOTE — Telephone Encounter (Signed)
Patient called to check if there was any cancellations on Dr. Romona Curls schedule for Monday 2/10 or Tues 2/11  Call back # [phone]

## 2018-10-25 ENCOUNTER — Encounter (INDEPENDENT_AMBULATORY_CARE_PROVIDER_SITE_OTHER): Payer: Self-pay | Admitting: Orthopaedic Surgery

## 2018-10-26 ENCOUNTER — Other Ambulatory Visit (INDEPENDENT_AMBULATORY_CARE_PROVIDER_SITE_OTHER): Payer: Self-pay | Admitting: Orthopaedic Surgery

## 2018-10-26 MED ORDER — METHYLPREDNISOLONE 4 MG PO TABS: ORAL_TABLET | ORAL | 0 refills | Status: DC

## 2018-11-26 ENCOUNTER — Ambulatory Visit (INDEPENDENT_AMBULATORY_CARE_PROVIDER_SITE_OTHER): Payer: Medicare Other | Admitting: Orthopaedic Surgery

## 2018-12-10 ENCOUNTER — Encounter: Payer: Self-pay | Admitting: Oncology

## 2018-12-10 ENCOUNTER — Ambulatory Visit (INDEPENDENT_AMBULATORY_CARE_PROVIDER_SITE_OTHER): Payer: Medicare Other | Admitting: Orthopaedic Surgery

## 2018-12-11 ENCOUNTER — Other Ambulatory Visit: Payer: Self-pay | Admitting: *Deleted

## 2018-12-11 MED ORDER — ANASTROZOLE 1 MG PO TABS: 1.0000 mg | ORAL_TABLET | Freq: Every day | ORAL | 2 refills | Status: DC

## 2019-01-21 ENCOUNTER — Other Ambulatory Visit (INDEPENDENT_AMBULATORY_CARE_PROVIDER_SITE_OTHER): Payer: Self-pay | Admitting: Orthopaedic Surgery

## 2019-01-21 NOTE — Telephone Encounter (Signed)
Ok to rf? 

## 2019-05-25 ENCOUNTER — Encounter: Payer: Self-pay | Admitting: Oncology

## 2019-05-26 ENCOUNTER — Other Ambulatory Visit: Payer: Self-pay

## 2019-05-26 ENCOUNTER — Other Ambulatory Visit: Payer: Self-pay | Admitting: *Deleted

## 2019-05-26 DIAGNOSIS — Z20822 Contact with and (suspected) exposure to covid-19: Secondary | ICD-10-CM

## 2019-05-26 MED ORDER — ANASTROZOLE 1 MG PO TABS: 1.0000 mg | ORAL_TABLET | Freq: Every day | ORAL | 2 refills | Status: DC

## 2019-05-28 ENCOUNTER — Other Ambulatory Visit: Payer: Self-pay | Admitting: Internal Medicine

## 2019-05-28 DIAGNOSIS — E785 Hyperlipidemia, unspecified: Secondary | ICD-10-CM

## 2019-05-28 LAB — NOVEL CORONAVIRUS, NAA: SARS-CoV-2, NAA: NOT DETECTED

## 2019-06-29 ENCOUNTER — Other Ambulatory Visit: Payer: Self-pay

## 2019-06-29 ENCOUNTER — Ambulatory Visit
Admission: RE | Admit: 2019-06-29 | Discharge: 2019-06-29 | Disposition: A | Discharge status: Home / Self Care | Payer: No Typology Code available for payment source | Source: Ambulatory Visit | Attending: Internal Medicine | Admitting: Internal Medicine

## 2019-06-29 DIAGNOSIS — E785 Hyperlipidemia, unspecified: Secondary | ICD-10-CM

## 2019-08-10 ENCOUNTER — Ambulatory Visit: Payer: Medicare Other | Admitting: Neurology

## 2019-09-14 ENCOUNTER — Other Ambulatory Visit: Payer: Self-pay | Admitting: Oncology

## 2019-09-30 ENCOUNTER — Other Ambulatory Visit: Payer: Medicare PPO

## 2019-10-01 ENCOUNTER — Encounter: Payer: Self-pay | Admitting: Oncology

## 2019-10-04 ENCOUNTER — Other Ambulatory Visit: Payer: Self-pay

## 2019-10-04 ENCOUNTER — Telehealth: Payer: Self-pay | Admitting: Oncology

## 2019-10-04 DIAGNOSIS — C50812 Malignant neoplasm of overlapping sites of left female breast: Secondary | ICD-10-CM

## 2019-10-04 NOTE — Telephone Encounter (Signed)
R/s appt per 1/18 sch message - unable to reach pt . Left message with appt date and time

## 2019-10-05 ENCOUNTER — Inpatient Hospital Stay: Payer: Medicare PPO

## 2019-10-05 ENCOUNTER — Inpatient Hospital Stay: Payer: Medicare PPO | Admitting: Oncology

## 2019-10-13 NOTE — Progress Notes (Signed)
[facility]  [phone] [phone]     ID: [name] DOB: [birth year]  MR#: [id number]  UYQ#:[id number]  Patient Care Team: Marton Redwood, MD as PCP - General (Internal Medicine) Alenna Russell, Virgie Dad, MD as Consulting Physician (Oncology) Mcarthur Rossetti, MD as Consulting Physician (Orthopedic Surgery) Sorscher, Danice Goltz, MD as Consulting Physician (Hematology and Oncology) Sheilah Mins, MD as Consulting Physician (Surgical Oncology) Armandina Gemma, MD as Consulting Physician (General Surgery) Rolm Bookbinder, MD as Consulting Physician (Dermatology) Ladene Artist, MD as Consulting Physician (Gastroenterology) OTHER MD:   CHIEF COMPLAINT: Estrogen receptor positive breast cancer (s/p bilateral mastectomies)  CURRENT TREATMENT: Anastrozole; [zoledronate]   INTERVAL HISTORY: [name] returns today for follow up of her estrogen receptor positive breast cancer.  She continues on anastrozole.  Hot flashes are not a major issue but she does have some vaginal dryness problems.  [name]'s last bone density screening on 02/25/2017, showed a T-score of -1.9, which is considered osteopenic.  She receives Reclast through Dr. Brigitte Pulse, with a dose coming up in the next few months per patient   REVIEW OF SYSTEMS: [name] is doing "great".  She mostly stays by herself, but does wet walking 3 days a week, some rowing using her son's rowing machine and some yoga.  She is taking turmeric for arthritis issues.  She recently had a very favorable cardiac score.  She has already received her first vaccine dose [employer]) with no side effects.  A detailed review of systems today was otherwise stable   HISTORY OF CURRENT ILLNESS: From the original intake note:  [name] underwent bilateral diagnostic mammography with tomography and left breast ultrasonography at [facility] on 02/15/2013 and 02/16/2013 respectively showing: a spiculated mass in the left breast  at 2 o'clock as well as a second asymmetry at 12 o'clock, both of which correspond to hypoechoic masses on ultrasound. These two masses were biopsied under ultrasound guidance and shown to reflect carcinoma. Within the right breast, there is architectural distortion at 12 o'clock corresponding to a scar marker.   In addition, she also underwent a breast MRI on 02/24/2013 showing: 2 masses representing biopsy-proven cancer in the left breast as well as adjacent areas of suspicious nonmass enhancement. The overall extent of suspicious enhancement (including the masses and nonmass enhancement) is over 5 cm. Within the upper inner right breast at 12-1 o'clock, there is an enhancing mass which a central signal void, corresponding to the area of prior excisional biopsy. This was subsequently biopsied under MR guidance on 03/10/2013 with pathology results revealing fat necrosis.  She underwent a biopsy of the left breast area in question. Pathology from this procedure showed ([id number]): invasive mammary carcinoma and an associated mammary carcinoma in situ in the left breast at 2 o'clock, compatible with a grade II carcinoma. Prognostic indicators significant for: estrogen receptor, 100% positive with strong staining intensity and progesterone receptor, 69% positive with moderate staining intensity. Proliferation marker Ki67 at 19%. HER2 positive by chromogenic in situ hybridization with a signals ratio 3.00 and number per cell 4.20.    A second biopsy showed ([id number]): invasive mammary carcinoma and an associated mammary carcinoma in situ in the left breast at 12 o'clock, compatible with a grade II carcinoma. Prognostic indicators significant for: estrogen receptor, 100% positive and progesterone receptor with strong staining intensity, 71% positive with moderate staining intensity. Proliferation marker Ki67 at 34%. HER2 negative by chromogenic in situ hybridization with a signals ratio 1.45 and number per cell  3.40.   She underwent a hysterectomy with bilateral salpingo oophorectomy on 03/02/2013. The pathology from this procedure showed ([id number]):   1. 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   2. Uterus and cervix, with left tube and ovary   - Leiomyomata and adenomyosis   - Endometrium: benign endometrial polyp and adjacent benign proliferative endometrium, no atypia, hyperplasia or malignancy.    - Cervix: benign squamous mucosa and endocervical mucosa, no dysplasia or malignancy.   - Left ovary: ovarian fibroma, 3.2 cm, no atypia or malignancy.    - Fallopian tube: benign fallopian tube tissue, no evidence of endometriosis, atypia, or malignancy.   [name] underwent a bilateral mastectomy with Dr. Harlow Asa on 04/23/2013. The pathology from this procedure showed ([id number]):   1. Breast, simple mastectomy, right: breast tissue with fibrocystic changes and calcifications. Previous biopsy site. No malignancy identified.  2. Lymph node, sentinel, biopsy, left axilla #1: one lymph node positive for metastatic carcinoma with extracapsular extension (1/1).  3. Lymph node, sentinel, biopsy, left axilla #2: one lymph node negative for tumor (0/1).  4. Lymph node, sentinel, biopsy, left axilla #3: one lymph node negative for tumor (0/1).  5. Breast, simple mastectomy, left: invasive and in situ ductal carcinoma. The invasive ductal carcinoma is elston grade ii/iii, multifocal with two foci measuring 2.2 and 1.7 cm. The ductal carcinoma in situ is cribriform and comedo types, nuclear grade 2 with associated calcifications.   - Lymphovascular invasion is identified.   - Perineural invasion is identified.   - Surgical resection margins are negative for carcinoma.  6. Lymph nodes, regional resection, left axilla: one of eleven axillary lymph nodes positive for metastatic  carcinoma (2/14 total).  The patient's subsequent history is as detailed below.   PAST MEDICAL HISTORY: Past Medical History:  Diagnosis Date  . Atrophic vaginitis   . breast cancer 2014  . Breast lobular neoplasia    atypical  . CHF (congestive heart failure) ([place])   . Fibrocystic breast changes   . Heart murmur    grade 1  per patient -echo 7/14  . Hyperlipidemia   . Knee pain, bilateral   . 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 Microcalcifications of the breast    atypical ductal hyperplasia with calcifications  . Osteopenia    Dr Toney Rakes  . Osteopenia   . PMB (postmenopausal bleeding)    Dr Toney Rakes  . Seasonal allergies   . Sleep apnea    cpap  . Vitamin D deficiency   . Wears glasses     PAST SURGICAL HISTORY: Past Surgical History:  Procedure Laterality Date  . ABDOMINAL HYSTERECTOMY N/A 03/02/2013   Procedure: TOTAL HYSTERECTOMY ABDOMINAL;  Surgeon: Imagene Gurney A. Alycia Rossetti, MD;  Location: [facility];  Service: Gynecology;  Laterality: N/A;  . AXILLARY LYMPH NODE BIOPSY Left 04/08/2013   Procedure: AXILLARY LYMPH NODE BIOPSY and axillary lymph node disection;  Surgeon: [name], MD;  Location: [place];  Service: General;  Laterality: Left;  nuclear medicine injection   . BREAST BIOPSY  01/09/2012   Procedure: BREAST BIOPSY WITH NEEDLE LOCALIZATION;  Surgeon: [name], MD;  Location: [place];  Service: General;  Laterality: Right;  . CESAREAN SECTION  1976  . COLONOSCOPY  [id number], [id number]   polyps 2013 only, Dr. Fuller Plan  . DILATION AND CURETTAGE  OF UTERUS    . ENDOMETRIAL BIOPSY  2002   Uterine  polyps, Dr. Cherylann Banas  . HYSTEROSCOPY    . PORT-A-CATH REMOVAL Right 01/02/2015   Procedure: REMOVAL PORT-A-CATH;  Surgeon: Armandina Gemma, MD;  Location: [place];  Service: General;  Laterality: Right;  . PORTACATH PLACEMENT Right 05/04/2013   Procedure: INSERTION PORT-A-CATH;  Surgeon: [name], MD;  Location:  [place];  Service: General;  Laterality: Right;  . SALPINGOOPHORECTOMY Bilateral 03/02/2013   Procedure: BILATERAL SALPINGO OOPHORECTOMY;  Surgeon: Imagene Gurney A. Alycia Rossetti, MD;  Location: [facility];  Service: Gynecology;  Laterality: Bilateral;  . TOTAL MASTECTOMY Bilateral 04/08/2013   Procedure: TOTAL MASTECTOMY;  Surgeon: [name], MD;  Location: [place];  Service: General;  Laterality: Bilateral;  needs bed for over night stay    FAMILY HISTORY: Family History  Problem Relation Age of Onset  . Alcohol abuse Mother   . Cancer Father        bone marrow cancer  . Alcohol abuse Father   . Von Willebrand disease Father   . Depression Son        Recovering alcoholic  . Depression Maternal Aunt   . Prostate cancer Maternal Uncle   . Diabetes Paternal Uncle   . Heart attack Paternal Uncle 92  . Cancer Paternal Grandmother        skin cancer  . Heart failure Paternal Grandmother        in 58s  . GER disease Paternal Grandmother        esophageal stricture  . GER disease Son   . Colon cancer Neg Hx   . Stomach cancer Neg Hx   . Stroke Neg Hx    [name]'s father died from "cancer of the bone marrow" at age 13--possibly myelodysplasia since he had some radiation exposure as a marine in [place].. Patients' mother died due to complications with alcoholism at age 62. The patient has 2 sisters. [name]'s middle sister was diagnosed with breast cancer at the age of 73. [name]'s uncle had prostate cancer. Patient denies anyone in her family having ovarian cancer.   GYNECOLOGIC HISTORY:  No LMP recorded. Patient is postmenopausal. 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 These days, she spends her time playing bridge, quilting, and spending time with her  grandchildren. [name] is divorced and lives by herself. She has one child, Shanon Brow, who works in Scientist, research (life sciences) estate here in [place]. [name] has two grandchildren who are 16 and 63 (as of 09/2018). She attends [employer].   ADVANCED DIRECTIVES: Her son, Shanon Brow, is her healthcare power of attorney.     HEALTH MAINTENANCE: Social History   Tobacco Use  . Smoking status: Never Smoker  . Smokeless tobacco: Never Used  . Tobacco comment: second hand smoke for decades  Substance Use Topics  . Alcohol use: Yes    Alcohol/week: 2.0 standard drinks    Types: 2 Glasses of wine per week    Comment: socially 1 - 2 glasses of wine per week  . Drug use: No     Colonoscopy: 03/25/2017/ Fuller Plan  PAP: Status post hysterectomy  Bone density:T -1.9; 02/25/2017 [facility]   Allergies  Allergen Reactions  . Codeine  Rash    Post C section  . Oxycodone Itching    Current Outpatient Medications  Medication Sig Dispense Refill  . anastrozole (ARIMIDEX) 1 MG tablet Take 1 tablet (1 mg total) by mouth daily. 90 tablet 4  . BIOTIN PO Take 5,000 mcg by mouth daily.    Marland Kitchen buPROPion (WELLBUTRIN XL) 150 MG 24 hr tablet Take 150 mg by mouth daily.  11  . cholecalciferol (VITAMIN D) 1000 UNITS tablet Take 2,000 Units by mouth daily.     . cycloSPORINE (RESTASIS) 0.05 % ophthalmic emulsion Place 1 drop into both eyes 2 (two) times daily.    . DULoxetine (CYMBALTA) 60 MG capsule Take 60 mg by mouth daily.    . OMEGA-3 FATTY ACIDS PO Take by mouth.    . Probiotic Product (ALIGN PO) Take by mouth.    . Turmeric 400 MG CAPS Take by mouth.    . Valerian 100 MG CAPS Take by mouth. 840 capsule 0  . zoledronic acid (RECLAST) 5 MG/100ML SOLN injection Inject into the vein.     Current Facility-Administered Medications  Medication Dose Route Frequency Provider Last Rate Last Admin  . 0.9 %  sodium chloride infusion  500 mL Intravenous Continuous Ladene Artist, MD         OBJECTIVE:  Middle-aged white woman who appears stated age  Vitals:   10/14/19 1526  BP: 95/68  Pulse: 83  Resp: 17  Temp: (!) 97 F (36.1 C)  SpO2: 97%     Body mass index is 31.45 kg/m.   Wt Readings from Last 3 Encounters:  10/14/19 183 lb 3.2 oz (83.1 kg)  09/28/18 190 lb 8 oz (86.4 kg)  09/18/18 185 lb (83.9 kg)      ECOG FS:0 - Asymptomatic  Sclerae unicteric, EOMs intact Wearing a mask No cervical or supraclavicular adenopathy Lungs no rales or rhonchi Heart regular rate and rhythm Abd soft, nontender, positive bowel sounds MSK no focal spinal tenderness, no upper extremity lymphedema Neuro: nonfocal, well oriented, appropriate affect Breasts: Status post bilateral mastectomies.  There is no evidence of chest wall recurrence.  Both axillae are benign.   LAB RESULTS:  CMP     Component Value Date/Time   NA 144 10/14/2019 1501   NA 140 03/11/2013 1443   K 4.4 10/14/2019 1501   K 3.8 03/11/2013 1443   CL 105 10/14/2019 1501   CO2 31 10/14/2019 1501   CO2 29 03/11/2013 1443   GLUCOSE 98 10/14/2019 1501   GLUCOSE 165 (H) 03/11/2013 1443   BUN 16 10/14/2019 1501   BUN 12.9 03/11/2013 1443   CREATININE 0.94 10/14/2019 1501   CREATININE 0.8 03/11/2013 1443   CALCIUM 9.5 10/14/2019 1501   CALCIUM 9.4 03/11/2013 1443   PROT 7.1 10/14/2019 1501   PROT 7.1 03/11/2013 1443   ALBUMIN 4.0 10/14/2019 1501   ALBUMIN 3.4 (L) 03/11/2013 1443   AST 16 10/14/2019 1501   AST 14 03/11/2013 1443   ALT 9 10/14/2019 1501   ALT 10 03/11/2013 1443   ALKPHOS 104 10/14/2019 1501   ALKPHOS 83 03/11/2013 1443   BILITOT 0.5 10/14/2019 1501   BILITOT 0.52 03/11/2013 1443   GFRNONAA >60 10/14/2019 1501   GFRAA >60 10/14/2019 1501    No results found for: TOTALPROTELP, ALBUMINELP, A1GS, A2GS, BETS, BETA2SER, GAMS, MSPIKE, SPEI  No results found for: KPAFRELGTCHN, LAMBDASER, KAPLAMBRATIO  Lab Results  Component Value Date   WBC 6.1 10/14/2019   NEUTROABS 3.5 10/14/2019  HGB 14.5  10/14/2019   HCT 44.5 10/14/2019   MCV 89.7 10/14/2019   PLT 173 10/14/2019   No results found for: LABCA2  No components found for: [id number]  No results for input(s): INR in the last 168 hours.  No results found for: LABCA2  No results found for: OEV035  No results found for: KKX381  No results found for: WEX937  No results found for: CA2729  No components found for: HGQUANT  No results found for: CEA1 / No results found for: CEA1   No results found for: AFPTUMOR  No results found for: CHROMOGRNA  No results found for: HGBA, HGBA2QUANT, HGBFQUANT, HGBSQUAN (Hemoglobinopathy evaluation)   No results found for: LDH  No results found for: IRON, TIBC, IRONPCTSAT (Iron and TIBC)  No results found for: FERRITIN  Urinalysis    Component Value Date/Time   COLORURINE YELLOW 07/26/2017 1411   APPEARANCEUR HAZY (A) 07/26/2017 1411   LABSPEC 1.019 07/26/2017 1411   PHURINE 5.0 07/26/2017 1411   GLUCOSEU NEGATIVE 07/26/2017 1411   HGBUR MODERATE (A) 07/26/2017 1411   BILIRUBINUR NEGATIVE 07/26/2017 1411   KETONESUR 5 (A) 07/26/2017 1411   PROTEINUR NEGATIVE 07/26/2017 1411   UROBILINOGEN 0.2 01/08/2012 1047   NITRITE NEGATIVE 07/26/2017 1411   LEUKOCYTESUR TRACE (A) 07/26/2017 1411     STUDIES:  No results found.   ELIGIBLE FOR AVAILABLE RESEARCH PROTOCOL: no   ASSESSMENT: 74 y.o.  [place] woman status post bilateral mastectomies 04/23/2013, showing  (1) on the right, no evidence of malignancy (prior biopsy showed ADH)  (2) on the left, an mpT2 pN1, stage IB invasive ductal carcinoma, grade 2, estrogen and progesterone receptor positive, with an MIB-1 between 69 and 71%, with negative margins  (a) 1 of the 2 tumors sampled (at 12:00) was HER-2 positive with a signals ratio of 3.00; the second tumor was HER-2 negative with a ratio of 1.45.  (b) 2 of 14 left axillary lymph nodes were involved with macrometastatic deposits  (3) treated adjuvantly between  05/28/2013 and 07/30/2013 with cyclophosphamide and doxorubicin given every 21 days x 4, followed by paclitaxel every 21 days x 4 given with trastuzumab, started 08/20/2013, completing 4 cycles of paclitaxel (dose reduced) February 2015  (4) trastuzumab continued to total 1 year (February 2016)  (a) trastuzumab held August-September 2014 due to small drop in ejection fraction; resumed October 2014  (5) anastrozole started February 2015  (a) osteopenia with a T score of -1.9 on bone density scan 02/25/2017  (b) status post alendronate; discontinued secondary to reflux  (c) currently on yearly zoledronate through Dr. Raul Del office   PLAN: Keyna is now 6-1/2 years out from definitive surgery for her breast cancer with no evidence of disease recurrence.  This is very favorable.  She is tolerating anastrozole well and the plan is to continue that for total of 7years which will and a year from now. Accordingly at the next visit she will "graduate."  She has moderate osteopenia which is being dealt with through Dr. Raul Del office with yearly Reclast.  She is taking appropriate pandemic precautions and has already received her first vaccine dose  She knows to call for any other issue that may develop before the next visit.  Total encounter time 20 minutes.*  [name], Virgie Dad, MD  10/14/19 5:33 PM Medical Oncology and Hematology [facility] [place], [place] [address] Tel. [phone]    Fax. [phone]    I, Wilburn Mylar, am acting as  scribe for Dr. Sarajane Jews C. Bryton Romagnoli.  I, Lurline Del MD, have reviewed the above documentation for accuracy and completeness, and I agree with the above.    *Total Encounter Time as defined by the Centers for Medicare and Medicaid Services includes, in addition to the face-to-face time of a patient visit (documented in the note above) non-face-to-face time: obtaining and reviewing outside history, ordering and  reviewing medications, tests or procedures, care coordination (communications with other health care professionals or caregivers) and documentation in the medical record.

## 2019-10-14 ENCOUNTER — Other Ambulatory Visit: Payer: Self-pay

## 2019-10-14 ENCOUNTER — Inpatient Hospital Stay (HOSPITAL_BASED_OUTPATIENT_CLINIC_OR_DEPARTMENT_OTHER): Payer: Medicare PPO | Admitting: Oncology

## 2019-10-14 ENCOUNTER — Inpatient Hospital Stay: Payer: Medicare PPO | Attending: Oncology

## 2019-10-14 VITALS — BP 95/68 | HR 83 | Temp 97.0°F | Resp 17 | Ht 64.0 in | Wt 183.2 lb

## 2019-10-14 DIAGNOSIS — C773 Secondary and unspecified malignant neoplasm of axilla and upper limb lymph nodes: Secondary | ICD-10-CM | POA: Diagnosis not present

## 2019-10-14 DIAGNOSIS — C50812 Malignant neoplasm of overlapping sites of left female breast: Secondary | ICD-10-CM | POA: Diagnosis not present

## 2019-10-14 DIAGNOSIS — C50912 Malignant neoplasm of unspecified site of left female breast: Secondary | ICD-10-CM | POA: Insufficient documentation

## 2019-10-14 DIAGNOSIS — R232 Flushing: Secondary | ICD-10-CM | POA: Insufficient documentation

## 2019-10-14 DIAGNOSIS — E782 Mixed hyperlipidemia: Secondary | ICD-10-CM

## 2019-10-14 DIAGNOSIS — Z9013 Acquired absence of bilateral breasts and nipples: Secondary | ICD-10-CM | POA: Insufficient documentation

## 2019-10-14 DIAGNOSIS — R011 Cardiac murmur, unspecified: Secondary | ICD-10-CM | POA: Insufficient documentation

## 2019-10-14 DIAGNOSIS — M8580 Other specified disorders of bone density and structure, unspecified site: Secondary | ICD-10-CM

## 2019-10-14 DIAGNOSIS — M5431 Sciatica, right side: Secondary | ICD-10-CM | POA: Diagnosis not present

## 2019-10-14 DIAGNOSIS — I509 Heart failure, unspecified: Secondary | ICD-10-CM | POA: Diagnosis not present

## 2019-10-14 DIAGNOSIS — G4730 Sleep apnea, unspecified: Secondary | ICD-10-CM | POA: Diagnosis not present

## 2019-10-14 DIAGNOSIS — Z79811 Long term (current) use of aromatase inhibitors: Secondary | ICD-10-CM | POA: Diagnosis not present

## 2019-10-14 DIAGNOSIS — Z170 Estrogen receptor positive status [ER+]: Secondary | ICD-10-CM | POA: Insufficient documentation

## 2019-10-14 DIAGNOSIS — E559 Vitamin D deficiency, unspecified: Secondary | ICD-10-CM | POA: Diagnosis not present

## 2019-10-14 DIAGNOSIS — Z79899 Other long term (current) drug therapy: Secondary | ICD-10-CM | POA: Insufficient documentation

## 2019-10-14 DIAGNOSIS — G5600 Carpal tunnel syndrome, unspecified upper limb: Secondary | ICD-10-CM

## 2019-10-14 LAB — CMP (CANCER CENTER ONLY)
ALT: 9 U/L (ref 0–44)
AST: 16 U/L (ref 15–41)
Albumin: 4 g/dL (ref 3.5–5.0)
Alkaline Phosphatase: 104 U/L (ref 38–126)
Anion gap: 8 (ref 5–15)
BUN: 16 mg/dL (ref 8–23)
CO2: 31 mmol/L (ref 22–32)
Calcium: 9.5 mg/dL (ref 8.9–10.3)
Chloride: 105 mmol/L (ref 98–111)
Creatinine: 0.94 mg/dL (ref 0.44–1.00)
GFR, Est AFR Am: >60 mL/min (ref >60)
GFR, Estimated: >60 mL/min (ref >60)
Glucose, Bld: 98 mg/dL (ref 70–99)
Potassium: 4.4 mmol/L (ref 3.5–5.1)
Sodium: 144 mmol/L (ref 135–145)
Total Bilirubin: 0.5 mg/dL (ref 0.3–1.2)
Total Protein: 7.1 g/dL (ref 6.5–8.1)

## 2019-10-14 LAB — CBC WITH DIFFERENTIAL (CANCER CENTER ONLY)
Abs Immature Granulocytes: 0.03 10*3/uL (ref 0.00–0.07)
Basophils Absolute: 0 10*3/uL (ref 0.0–0.1)
Basophils Relative: 1 %
Eosinophils Absolute: 0.3 10*3/uL (ref 0.0–0.5)
Eosinophils Relative: 5 %
HCT: 44.5 % (ref 36.0–46.0)
Hemoglobin: 14.5 g/dL (ref 12.0–15.0)
Immature Granulocytes: 1 %
Lymphocytes Relative: 29 %
Lymphs Abs: 1.8 10*3/uL (ref 0.7–4.0)
MCH: 29.2 pg (ref 26.0–34.0)
MCHC: 32.6 g/dL (ref 30.0–36.0)
MCV: 89.7 fL (ref 80.0–100.0)
Monocytes Absolute: 0.4 10*3/uL (ref 0.1–1.0)
Monocytes Relative: 7 %
Neutro Abs: 3.5 10*3/uL (ref 1.7–7.7)
Neutrophils Relative %: 57 %
Platelet Count: 173 10*3/uL (ref 150–400)
RBC: 4.96 MIL/uL (ref 3.87–5.11)
RDW: 12.2 % (ref 11.5–15.5)
WBC Count: 6.1 10*3/uL (ref 4.0–10.5)
nRBC: 0 % (ref 0.0–0.2)

## 2019-10-14 MED ORDER — ANASTROZOLE 1 MG PO TABS: 1.0000 mg | ORAL_TABLET | Freq: Every day | ORAL | 4 refills | Status: DC

## 2019-10-15 ENCOUNTER — Telehealth: Payer: Self-pay | Admitting: Oncology

## 2019-10-15 NOTE — Telephone Encounter (Signed)
I talk with patient regarding schedule  

## 2019-11-03 ENCOUNTER — Other Ambulatory Visit (HOSPITAL_COMMUNITY): Payer: Self-pay | Admitting: *Deleted

## 2019-11-04 ENCOUNTER — Inpatient Hospital Stay (HOSPITAL_COMMUNITY): Admission: RE | Admit: 2019-11-04 | Payer: Medicare PPO | Source: Ambulatory Visit

## 2019-11-11 ENCOUNTER — Ambulatory Visit (HOSPITAL_COMMUNITY)
Admission: RE | Admit: 2019-11-11 | Discharge: 2019-11-11 | Disposition: A | Discharge status: Home / Self Care | Payer: Medicare PPO | Source: Ambulatory Visit | Attending: Internal Medicine | Admitting: Internal Medicine

## 2019-11-11 ENCOUNTER — Other Ambulatory Visit: Payer: Self-pay

## 2019-11-11 DIAGNOSIS — M810 Age-related osteoporosis without current pathological fracture: Secondary | ICD-10-CM | POA: Insufficient documentation

## 2019-11-11 MED ORDER — ZOLEDRONIC ACID 5 MG/100ML IV SOLN
5.0000 mg | Freq: Once | INTRAVENOUS | Status: AC
  Administered 2019-11-11: 5 mg via INTRAVENOUS

## 2019-11-11 MED ORDER — ZOLEDRONIC ACID 5 MG/100ML IV SOLN
INTRAVENOUS | Status: AC
  Filled 2019-11-11: qty 100

## 2019-11-17 ENCOUNTER — Other Ambulatory Visit: Payer: Self-pay | Admitting: Oncology

## 2019-12-15 ENCOUNTER — Other Ambulatory Visit: Payer: Self-pay | Admitting: Orthopaedic Surgery

## 2019-12-15 DIAGNOSIS — M5432 Sciatica, left side: Secondary | ICD-10-CM

## 2019-12-15 DIAGNOSIS — M5431 Sciatica, right side: Secondary | ICD-10-CM

## 2020-01-11 DIAGNOSIS — R300 Dysuria: Secondary | ICD-10-CM | POA: Diagnosis not present

## 2020-02-05 DIAGNOSIS — M545 Low back pain: Secondary | ICD-10-CM | POA: Diagnosis not present

## 2020-02-05 DIAGNOSIS — N200 Calculus of kidney: Secondary | ICD-10-CM | POA: Diagnosis not present

## 2020-02-15 ENCOUNTER — Encounter: Payer: Self-pay | Admitting: Gastroenterology

## 2020-04-04 ENCOUNTER — Other Ambulatory Visit: Payer: Self-pay | Admitting: Oncology

## 2020-04-26 ENCOUNTER — Encounter: Payer: Self-pay | Admitting: Gastroenterology

## 2020-05-18 DIAGNOSIS — E559 Vitamin D deficiency, unspecified: Secondary | ICD-10-CM | POA: Diagnosis not present

## 2020-05-18 DIAGNOSIS — R7301 Impaired fasting glucose: Secondary | ICD-10-CM | POA: Diagnosis not present

## 2020-05-18 DIAGNOSIS — E785 Hyperlipidemia, unspecified: Secondary | ICD-10-CM | POA: Diagnosis not present

## 2020-05-23 DIAGNOSIS — R82998 Other abnormal findings in urine: Secondary | ICD-10-CM | POA: Diagnosis not present

## 2020-05-23 DIAGNOSIS — Z1212 Encounter for screening for malignant neoplasm of rectum: Secondary | ICD-10-CM | POA: Diagnosis not present

## 2020-05-25 DIAGNOSIS — Z0000 Encounter for general adult medical examination without abnormal findings: Secondary | ICD-10-CM | POA: Diagnosis not present

## 2020-05-25 DIAGNOSIS — E785 Hyperlipidemia, unspecified: Secondary | ICD-10-CM | POA: Diagnosis not present

## 2020-05-25 DIAGNOSIS — R7301 Impaired fasting glucose: Secondary | ICD-10-CM | POA: Diagnosis not present

## 2020-05-25 DIAGNOSIS — F3342 Major depressive disorder, recurrent, in full remission: Secondary | ICD-10-CM | POA: Diagnosis not present

## 2020-05-25 DIAGNOSIS — L309 Dermatitis, unspecified: Secondary | ICD-10-CM | POA: Diagnosis not present

## 2020-05-25 DIAGNOSIS — C50919 Malignant neoplasm of unspecified site of unspecified female breast: Secondary | ICD-10-CM | POA: Diagnosis not present

## 2020-05-25 DIAGNOSIS — I427 Cardiomyopathy due to drug and external agent: Secondary | ICD-10-CM | POA: Diagnosis not present

## 2020-05-25 DIAGNOSIS — M8580 Other specified disorders of bone density and structure, unspecified site: Secondary | ICD-10-CM | POA: Diagnosis not present

## 2020-05-25 DIAGNOSIS — E559 Vitamin D deficiency, unspecified: Secondary | ICD-10-CM | POA: Diagnosis not present

## 2020-06-06 DIAGNOSIS — J302 Other seasonal allergic rhinitis: Secondary | ICD-10-CM | POA: Diagnosis not present

## 2020-06-06 DIAGNOSIS — K219 Gastro-esophageal reflux disease without esophagitis: Secondary | ICD-10-CM | POA: Diagnosis not present

## 2020-06-06 DIAGNOSIS — R110 Nausea: Secondary | ICD-10-CM | POA: Diagnosis not present

## 2020-06-06 DIAGNOSIS — Z853 Personal history of malignant neoplasm of breast: Secondary | ICD-10-CM | POA: Diagnosis not present

## 2020-06-06 DIAGNOSIS — R195 Other fecal abnormalities: Secondary | ICD-10-CM | POA: Diagnosis not present

## 2020-06-06 DIAGNOSIS — Z1152 Encounter for screening for COVID-19: Secondary | ICD-10-CM | POA: Diagnosis not present

## 2020-06-08 DIAGNOSIS — R110 Nausea: Secondary | ICD-10-CM | POA: Diagnosis not present

## 2020-06-08 DIAGNOSIS — R195 Other fecal abnormalities: Secondary | ICD-10-CM | POA: Diagnosis not present

## 2020-06-15 ENCOUNTER — Other Ambulatory Visit: Payer: Self-pay

## 2020-06-15 ENCOUNTER — Ambulatory Visit (AMBULATORY_SURGERY_CENTER): Payer: Self-pay

## 2020-06-15 VITALS — Ht 64.0 in | Wt 190.0 lb

## 2020-06-15 DIAGNOSIS — Z86010 Personal history of colonic polyps: Secondary | ICD-10-CM

## 2020-06-15 MED ORDER — NA SULFATE-K SULFATE-MG SULF 17.5-3.13-1.6 GM/177ML PO SOLN: 1.0000 | Freq: Once | ORAL | 0 refills | Status: AC

## 2020-06-15 NOTE — Progress Notes (Signed)
No egg or soy allergy known to patient  No issues with past sedation with any surgeries or procedures No intubation problems in the past  No FH of Malignant Hyperthermia No diet pills per patient No home 02 use per patient  No blood thinners per patient  Pt reports issues with occasional constipation --takes align and drink water to assist with constipation; No A fib or A flutter  EMMI video via [place] 19 guidelines implemented in [place] today with Pt and RN  Coupon given to pt in [place] today , Code to Pharmacy COVID vaccines completed on 10/2019 per pt;  Due to the COVID-19 pandemic we are asking patients to follow these guidelines. Please only bring one care partner. Please be aware that your care partner may wait in the car in the parking lot or if they feel like they will be too hot to wait in the car, they may wait in the lobby on the 4th floor. All care partners are required to wear a mask the entire time (we do not have any that we can provide them), they need to practice social distancing, and we will do a Covid check for all patient's and care partners when you arrive. Also we will check their temperature and your temperature. If the care partner waits in their car they need to stay in the parking lot the entire time and we will call them on their cell phone when the patient is ready for discharge so they can bring the car to the front of the building. Also all patient's will need to wear a mask into building.

## 2020-06-29 ENCOUNTER — Encounter: Payer: Medicare PPO | Admitting: Gastroenterology

## 2020-07-27 ENCOUNTER — Other Ambulatory Visit: Payer: Self-pay

## 2020-07-27 ENCOUNTER — Encounter: Payer: Self-pay | Admitting: Gastroenterology

## 2020-07-27 ENCOUNTER — Ambulatory Visit (AMBULATORY_SURGERY_CENTER): Payer: Medicare PPO | Admitting: Gastroenterology

## 2020-07-27 VITALS — BP 120/75 | HR 71 | Temp 97.5°F | Resp 13 | Ht 64.0 in | Wt 190.0 lb

## 2020-07-27 DIAGNOSIS — D124 Benign neoplasm of descending colon: Secondary | ICD-10-CM

## 2020-07-27 DIAGNOSIS — D125 Benign neoplasm of sigmoid colon: Secondary | ICD-10-CM

## 2020-07-27 DIAGNOSIS — Z1211 Encounter for screening for malignant neoplasm of colon: Secondary | ICD-10-CM | POA: Diagnosis not present

## 2020-07-27 DIAGNOSIS — D120 Benign neoplasm of cecum: Secondary | ICD-10-CM

## 2020-07-27 DIAGNOSIS — Z86010 Personal history of colonic polyps: Secondary | ICD-10-CM | POA: Diagnosis not present

## 2020-07-27 MED ORDER — SODIUM CHLORIDE 0.9 % IV SOLN: 500.0000 mL | INTRAVENOUS | Status: DC

## 2020-07-27 NOTE — Patient Instructions (Signed)
Discharge instructions given. ?Handouts on polyps and Hemorrhoids. ?Resume previous medications. ?YOU HAD AN ENDOSCOPIC PROCEDURE TODAY AT THE [facility]:   Refer to the procedure report that was given to you for any specific questions about what was found during the examination.  If the procedure report does not answer your questions, please call your gastroenterologist to clarify.  If you requested that your care partner not be given the details of your procedure findings, then the procedure report has been included in a sealed envelope for you to review at your convenience later. ? ?YOU SHOULD EXPECT: Some feelings of bloating in the abdomen. Passage of more gas than usual.  Walking can help get rid of the air that was put into your GI tract during the procedure and reduce the bloating. If you had a lower endoscopy (such as a colonoscopy or flexible sigmoidoscopy) you may notice spotting of blood in your stool or on the toilet paper. If you underwent a bowel prep for your procedure, you may not have a normal bowel movement for a few days. ? ?Please Note:  You might notice some irritation and congestion in your nose or some drainage.  This is from the oxygen used during your procedure.  There is no need for concern and it should clear up in a day or so. ? ?SYMPTOMS TO REPORT IMMEDIATELY: ? ?Following lower endoscopy (colonoscopy or flexible sigmoidoscopy): ? Excessive amounts of blood in the stool ? Significant tenderness or worsening of abdominal pains ? Swelling of the abdomen that is new, acute ? Fever of 100?F or higher ? ? ?For urgent or emergent issues, a gastroenterologist can be reached at any hour by calling [phone]. ?Do not use MyChart messaging for urgent concerns.  ? ? ?DIET:  We do recommend a small meal at first, but then you may proceed to your regular diet.  Drink plenty of fluids but you should avoid alcoholic beverages for 24 hours. ? ?ACTIVITY:  You should plan to take it  easy for the rest of today and you should NOT DRIVE or use heavy machinery until tomorrow (because of the sedation medicines used during the test).   ? ?FOLLOW UP: ?Our staff will call the number listed on your records 48-72 hours following your procedure to check on you and address any questions or concerns that you may have regarding the information given to you following your procedure. If we do not reach you, we will leave a message.  We will attempt to reach you two times.  During this call, we will ask if you have developed any symptoms of COVID 19. If you develop any symptoms (ie: fever, flu-like symptoms, shortness of breath, cough etc.) before then, please call [phone].  If you test positive for Covid 19 in the 2 weeks post procedure, please call and report this information to us.   ? ?If any biopsies were taken you will be contacted by phone or by letter within the next 1-3 weeks.  Please call us at [phone] if you have not heard about the biopsies in 3 weeks.  ? ? ?SIGNATURES/CONFIDENTIALITY: ?You and/or your care partner have signed paperwork which will be entered into your electronic medical record.  These signatures attest to the fact that that the information above on your After Visit Summary has been reviewed and is understood.  Full responsibility of the confidentiality of this discharge information lies with you and/or your care-partner.  ?

## 2020-07-27 NOTE — Progress Notes (Signed)
A and O x3. Report to RN. Tolerated MAC anesthesia well.

## 2020-07-27 NOTE — Op Note (Signed)
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 The patient's tolerance of                            previous anesthesia was also reviewed. The risks                            and benefits of the procedure and the sedation                            options and risks were discussed with the patient.                            All questions were answered, and informed consent                            was obtained. Prior Anticoagulants: The patient has                            taken no previous anticoagulant or antiplatelet                            agents. ASA Grade Assessment: II - A patient with                            mild systemic disease. After reviewing the risks                            and benefits, the patient was deemed in  satisfactory condition to undergo the procedure.                           After obtaining informed consent, the colonoscope                            was passed under direct vision. Throughout the                            procedure, the patient's blood pressure, pulse, and                            oxygen saturations were monitored continuously. The                            Colonoscope was introduced  through the anus and                            advanced to the the cecum, identified by                            appendiceal orifice and ileocecal valve. The                            ileocecal valve, appendiceal orifice, and rectum                            were photographed. The quality of the bowel                            preparation was good. The colonoscopy was performed                            without difficulty. The patient tolerated the                            procedure well. Scope In: 8:52:40 AM Scope Out: 9:10:52 AM Scope Withdrawal Time: 0 hours 14 minutes 52 seconds  Total Procedure Duration: 0 hours 18 minutes 12 seconds  Findings:                 The perianal and digital rectal examinations were                            normal.                           Three sessile polyps were found in the sigmoid                            colon, descending colon and cecum. The polyps were                            4 to 7 mm in size. These polyps were removed with a  cold snare. Resection and retrieval were complete.                           A single medium-sized localized angioectasia                            without bleeding was found in the cecum.                           Internal hemorrhoids were found during                            retroflexion. The hemorrhoids were small and Grade                            I (internal hemorrhoids that do not prolapse).                           The exam was otherwise without abnormality on                            direct and retroflexion views. Complications:            No immediate complications. Estimated blood loss:                            None. Estimated Blood Loss:     Estimated blood loss: none. Impression:               - Three 4 to 7 mm polyps in the sigmoid colon, in                            the descending colon and in the cecum, removed with                            a cold  snare. Resected and retrieved.                           - A single non-bleeding colonic angioectasia.                           - Internal hemorrhoids.                           - The examination was otherwise normal on direct                            and retroflexion views. Recommendation:           - Repeat colonoscopy date to be determined after                            pending pathology results are reviewed for                            surveillance based on pathology results vs no  future surveillance colonoscopies due to age.                           - Patient has a contact number available for                            emergencies. The signs and symptoms of potential                            delayed complications were discussed with the                            patient. Return to normal activities tomorrow.                            Written discharge instructions were provided to the                            patient.                           - Resume previous diet.                           - Continue present medications.                           - Await pathology results. Ladene Artist, MD 07/27/2020 9:15:34 AM This report has been signed electronically.

## 2020-07-27 NOTE — Progress Notes (Signed)
Pt's states no medical or surgical changes since previsit or office visit. 

## 2020-07-27 NOTE — Progress Notes (Signed)
Called to room to assist during endoscopic procedure.  Patient ID and intended procedure confirmed with present staff. Received instructions for my participation in the procedure from the performing physician.  

## 2020-07-31 ENCOUNTER — Telehealth: Payer: Self-pay

## 2020-07-31 NOTE — Telephone Encounter (Signed)
  Follow up Call-  Call back number 07/27/2020  Post procedure Call Back phone  # [phone]  Permission to leave phone message Yes  Some recent data might be hidden     Patient questions:  Do you have a fever, pain , or abdominal swelling? No. Pain Score  0 *  Have you tolerated food without any problems? Yes.    Have you been able to return to your normal activities? Yes.    Do you have any questions about your discharge instructions: Diet   No. Medications  No. Follow up visit  No.  Do you have questions or concerns about your Care? No.  Actions: * If pain score is 4 or above: No action needed, pain <4.  1. Have you developed a fever since your procedure? no  2.   Have you had an respiratory symptoms (SOB or cough) since your procedure? no  3.   Have you tested positive for COVID 19 since your procedure no  4.   Have you had any family members/close contacts diagnosed with the COVID 19 since your procedure?  no   If yes to any of these questions please route to Joylene John, RN and Joella Prince, RN

## 2020-08-04 ENCOUNTER — Other Ambulatory Visit: Payer: Self-pay | Admitting: Oncology

## 2020-08-08 ENCOUNTER — Encounter: Payer: Self-pay | Admitting: Gastroenterology

## 2020-10-11 DIAGNOSIS — M8580 Other specified disorders of bone density and structure, unspecified site: Secondary | ICD-10-CM | POA: Diagnosis not present

## 2020-10-11 DIAGNOSIS — E785 Hyperlipidemia, unspecified: Secondary | ICD-10-CM | POA: Diagnosis not present

## 2020-10-11 DIAGNOSIS — Z79899 Other long term (current) drug therapy: Secondary | ICD-10-CM | POA: Diagnosis not present

## 2020-10-11 DIAGNOSIS — E559 Vitamin D deficiency, unspecified: Secondary | ICD-10-CM | POA: Diagnosis not present

## 2020-10-15 NOTE — Progress Notes (Signed)
[place]  Telephone:([phone]) [phone] [phone]     ID: [name] DOB: 1945/12/07  MR#: [id number]  PRF#:[id number]  Patient Care Team: Marton Redwood, MD as PCP - General (Internal Medicine) Gwenlyn Hottinger, Virgie Dad, MD as Consulting Physician (Oncology) Mcarthur Rossetti, MD as Consulting Physician (Orthopedic Surgery) Sorscher, Danice Goltz, MD as Consulting Physician (Hematology and Oncology) Sheilah Mins, MD as Consulting Physician (Surgical Oncology) Armandina Gemma, MD as Consulting Physician (General Surgery) Rolm Bookbinder, MD as Consulting Physician (Dermatology) Ladene Artist, MD as Consulting Physician (Gastroenterology) OTHER MD:   CHIEF COMPLAINT: Estrogen receptor positive breast cancer (s/p bilateral mastectomies)  CURRENT TREATMENT: Completing 7 years of anastrozole   INTERVAL HISTORY: [name] returns today for follow up of her estrogen receptor positive breast cancer.  She continues on anastrozole.  Hot flashes are not a major issue but she does have some vaginal dryness problems.  She also has mild urinary stress incontinence which is a concern to her  Tamilyn's last bone density screening on 02/25/2017, showed a T-score of -1.9, which is considered osteopenic.  She receives Reclast through Dr. Brigitte Pulse, with a dose coming up in the next few months per patient   REVIEW OF SYSTEMS: [name] continues to play bridge online quilts and she does water walking 3 times a week in addition to taking regular walks.  She receives Reclast through Dr. Brigitte Pulse with no complications.  A detailed review of systems was otherwise stable   COVID 19 VACCINATION STATUS: [place] x2, status post booster September 2021   HISTORY OF CURRENT ILLNESS: From the original intake note:  Lavilla G Pasha underwent bilateral diagnostic mammography with tomography and left breast ultrasonography at [facility] on 02/15/2013 and 02/16/2013 respectively showing: a spiculated mass in  the left breast at 2 o'clock as well as a second asymmetry at 12 o'clock, both of which correspond to hypoechoic masses on ultrasound. These two masses were biopsied under ultrasound guidance and shown to reflect carcinoma. Within the right breast, there is architectural distortion at 12 o'clock corresponding to a scar marker.   In addition, she also underwent a breast MRI on 02/24/2013 showing: 2 masses representing biopsy-proven cancer in the left breast as well as adjacent areas of suspicious nonmass enhancement. The overall extent of suspicious enhancement (including the masses and nonmass enhancement) is over 5 cm. Within the upper inner right breast at 12-1 o'clock, there is an enhancing mass which a central signal void, corresponding to the area of prior excisional biopsy. This was subsequently biopsied under MR guidance on 03/10/2013 with pathology results revealing fat necrosis.  She underwent a biopsy of the left breast area in question. Pathology from this procedure showed ([id number]): invasive mammary carcinoma and an associated mammary carcinoma in situ in the left breast at 2 o'clock, compatible with a grade II carcinoma. Prognostic indicators significant for: estrogen receptor, 100% positive with strong staining intensity and progesterone receptor, 69% positive with moderate staining intensity. Proliferation marker Ki67 at 19%. HER2 positive by chromogenic in situ hybridization with a signals ratio 3.00 and number per cell 4.20.    A second biopsy showed ([id number]): invasive mammary carcinoma and an associated mammary carcinoma in situ in the left breast at 12 o'clock, compatible with a grade II carcinoma. Prognostic indicators significant for: estrogen receptor, 100% positive and progesterone receptor with strong staining intensity, 71% positive with moderate staining intensity. Proliferation marker Ki67 at 34%. HER2 negative by chromogenic in situ hybridization with a signals ratio 1.45 and  number per cell 3.40.   She underwent a hysterectomy with bilateral salpingo oophorectomy on 03/02/2013. The pathology from this procedure showed ([id number]):   1. 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   2. Uterus and cervix, with left tube and ovary   - Leiomyomata and adenomyosis   - Endometrium: benign endometrial polyp and adjacent benign proliferative endometrium, no atypia, hyperplasia or malignancy.    - Cervix: benign squamous mucosa and endocervical mucosa, no dysplasia or malignancy.   - Left ovary: ovarian fibroma, 3.2 cm, no atypia or malignancy.    - Fallopian tube: benign fallopian tube tissue, no evidence of endometriosis, atypia, or malignancy.   [name] underwent a bilateral mastectomy with Dr. Harlow Asa on 04/23/2013. The pathology from this procedure showed ([id number]):   1. Breast, simple mastectomy, right: breast tissue with fibrocystic changes and calcifications. Previous biopsy site. No malignancy identified.  2. Lymph node, sentinel, biopsy, left axilla #1: one lymph node positive for metastatic carcinoma with extracapsular extension (1/1).  3. Lymph node, sentinel, biopsy, left axilla #2: one lymph node negative for tumor (0/1).  4. Lymph node, sentinel, biopsy, left axilla #3: one lymph node negative for tumor (0/1).  5. Breast, simple mastectomy, left: invasive and in situ ductal carcinoma. The invasive ductal carcinoma is elston grade ii/iii, multifocal with two foci measuring 2.2 and 1.7 cm. The ductal carcinoma in situ is cribriform and comedo types, nuclear grade 2 with associated calcifications.   - Lymphovascular invasion is identified.   - Perineural invasion is identified.   - Surgical resection margins are negative for carcinoma.  6. Lymph nodes, regional resection, left axilla: one of eleven axillary lymph nodes  positive for metastatic carcinoma (2/14 total).  The patient's subsequent history is as detailed below.   PAST MEDICAL HISTORY: Past Medical History:  Diagnosis Date  . Allergy    seasonal allergies  . Atrophic vaginitis   . breast cancer 2014   LEFT  . Breast lobular neoplasia    atypical  . CHF (congestive heart failure) ([place])    secondary to chemo-cleared up after chemo  . Chronic kidney disease    hx of kidney stones  . Fibrocystic breast changes   . GERD (gastroesophageal reflux disease)    hx of  . Heart murmur    grade 1  per patient -echo 7/14  . Hyperlipidemia    on meds  . Knee pain, bilateral   . Major depression in full remission ([place])    on meds  . Microcalcifications of the breast    atypical ductal hyperplasia with calcifications  . Osteopenia    Dr Toney Rakes  . Osteopenia   . PMB (postmenopausal bleeding)    Dr Toney Rakes  . Seasonal allergies   . Sleep apnea    no cpap  . Vitamin D deficiency   . Wears glasses     PAST SURGICAL HISTORY: Past Surgical History:  Procedure Laterality Date  . ABDOMINAL HYSTERECTOMY N/A 03/02/2013   Procedure: TOTAL HYSTERECTOMY ABDOMINAL;  Surgeon: Imagene Gurney A. Alycia Rossetti, MD;  Location: [facility];  Service: Gynecology;  Laterality: N/A;  . AXILLARY LYMPH NODE BIOPSY Left 04/08/2013   Procedure: AXILLARY LYMPH NODE BIOPSY and axillary lymph node disection;  Surgeon: [name], MD;  Location: [place];  Service: General;  Laterality: Left;  nuclear medicine injection   .  BREAST BIOPSY  01/09/2012   Procedure: BREAST BIOPSY WITH NEEDLE LOCALIZATION;  Surgeon: [name], MD;  Location: [place];  Service: General;  Laterality: Right;  . CESAREAN SECTION  1976  . COLONOSCOPY  [id number], [id number]   polyps 2013 only, Dr. Fuller Plan  . COLONOSCOPY  2018   MS-MAC-hems/4 polyps/tics/SPP/adeq w/ lavage and suction  . DILATION AND CURETTAGE OF UTERUS    . ENDOMETRIAL BIOPSY  2002   Uterine  polyps, Dr.  Cherylann Banas  . HYSTEROSCOPY    . PORT-A-CATH REMOVAL Right 01/02/2015   Procedure: REMOVAL PORT-A-CATH;  Surgeon: Armandina Gemma, MD;  Location: [place];  Service: General;  Laterality: Right;  . PORTACATH PLACEMENT Right 05/04/2013   Procedure: INSERTION PORT-A-CATH;  Surgeon: [name], MD;  Location: [place];  Service: General;  Laterality: Right;  . SALPINGOOPHORECTOMY Bilateral 03/02/2013   Procedure: BILATERAL SALPINGO OOPHORECTOMY;  Surgeon: Imagene Gurney A. Alycia Rossetti, MD;  Location: [facility];  Service: Gynecology;  Laterality: Bilateral;  . TOTAL MASTECTOMY Bilateral 04/08/2013   Procedure: TOTAL MASTECTOMY;  Surgeon: [name], MD;  Location: [place];  Service: General;  Laterality: Bilateral;  needs bed for over night stay  . WISDOM TOOTH EXTRACTION      FAMILY HISTORY: Family History  Problem Relation Age of Onset  . Alcohol abuse Mother   . Alcohol abuse Father   . Von Willebrand disease Father   . Bone cancer Father        bone marrow cancer  . Depression Son        Recovering alcoholic  . Depression Maternal Aunt   . Prostate cancer Maternal Uncle   . Diabetes Paternal Uncle   . Heart attack Paternal Uncle 94  . Heart failure Paternal Grandmother        in 56s  . GER disease Paternal Grandmother        esophageal stricture  . Skin cancer Paternal Grandmother   . GER disease Son   . Colon cancer Neg Hx   . Stomach cancer Neg Hx   . Stroke Neg Hx   . Colon polyps Neg Hx   . Esophageal cancer Neg Hx   . Rectal cancer Neg Hx    [name]'s father died from "cancer of the bone marrow" at age 13--possibly myelodysplasia since he had some radiation exposure as a marine in [place].. Patients' mother died due to complications with alcoholism at age 41. The patient has 2 sisters. [name]'s middle sister was diagnosed with breast cancer at the age of 15. [name]'s uncle had prostate cancer. Patient denies anyone in her family having  ovarian cancer.   GYNECOLOGIC HISTORY:  No LMP recorded. Patient is postmenopausal. 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 Marland Kitchen She has one child, [name], who works in Scientist, research (life sciences) estate here in [place]. [name] has two grandchildren who are 35 and 41 (as of 09/2020. She attends [employer].   ADVANCED DIRECTIVES: Her son, [name], is her healthcare power of attorney.     HEALTH MAINTENANCE: Social History   Tobacco Use  . Smoking status: Never Smoker  . Smokeless tobacco: Never Used  . Tobacco comment: second hand smoke for decades  Vaping Use  .  Vaping Use: Never used  Substance Use Topics  . Alcohol use: Yes    Alcohol/week: 4.0 standard drinks    Types: 4 Standard drinks or equivalent per week    Comment: socially 1 - 2 glasses of wine per week  . Drug use: No     Colonoscopy: 03/25/2017/ Fuller Plan  PAP: Status post hysterectomy  Bone density:T -1.9; 02/25/2017 [facility]   Allergies  Allergen Reactions  . Codeine Rash    Post C section  . Oxycodone Itching    Current Outpatient Medications  Medication Sig Dispense Refill  . anastrozole (ARIMIDEX) 1 MG tablet TAKE 1 TABLET(1 MG) BY MOUTH DAILY 30 tablet 3  . buPROPion (WELLBUTRIN XL) 150 MG 24 hr tablet Take 150 mg by mouth daily.  11  . cholecalciferol (VITAMIN D) 1000 UNITS tablet Take 2,000 Units by mouth daily.     . cycloSPORINE (RESTASIS) 0.05 % ophthalmic emulsion Place 1 drop into both eyes 2 (two) times daily.    . DULoxetine (CYMBALTA) 60 MG capsule Take 60 mg by mouth daily.    . OMEGA-3 FATTY ACIDS PO Take by mouth.    . Probiotic Product (ALIGN PO) Take by mouth.    . zoledronic acid (RECLAST) 5 MG/100ML SOLN injection Inject into the vein.    Marland Kitchen zolpidem (AMBIEN) 5 MG tablet  daily as needed. (Patient not taking: Reported on 07/27/2020)     Current Facility-Administered Medications  Medication Dose Route Frequency Provider Last Rate Last Admin  . 0.9 %  sodium chloride infusion  500 mL Intravenous Continuous Ladene Artist, MD         OBJECTIVE: White woman in no acute distress  Vitals:   10/16/20 1335  BP: 102/61  Pulse: 80  Resp: 18  Temp: 97.8 F (36.6 C)  SpO2: 97%     Body mass index is 29.97 kg/m.   Wt Readings from Last 3 Encounters:  10/16/20 174 lb 9.6 oz (79.2 kg)  07/27/20 190 lb (86.2 kg)  06/15/20 190 lb (86.2 kg)      ECOG FS:1 - Symptomatic but completely ambulatory  Sclerae unicteric, EOMs intact Wearing a mask No cervical or supraclavicular adenopathy Lungs no rales or rhonchi Heart regular rate and rhythm Abd soft, nontender, positive bowel sounds MSK no focal spinal tenderness, no upper extremity lymphedema Neuro: nonfocal, well oriented, appropriate affect Breasts: Status post bilateral mastectomies.  There is no evidence of chest wall recurrence.  Both axillae are benign.   LAB RESULTS:  CMP     Component Value Date/Time   NA 142 10/16/2020 1322   NA 140 03/11/2013 1443   K 4.3 10/16/2020 1322   K 3.8 03/11/2013 1443   CL 105 10/16/2020 1322   CO2 30 10/16/2020 1322   CO2 29 03/11/2013 1443   GLUCOSE 106 (H) 10/16/2020 1322   GLUCOSE 165 (H) 03/11/2013 1443   BUN 19 10/16/2020 1322   BUN 12.9 03/11/2013 1443   CREATININE 0.84 10/16/2020 1322   CREATININE 0.94 10/14/2019 1501   CREATININE 0.8 03/11/2013 1443   CALCIUM 9.7 10/16/2020 1322   CALCIUM 9.4 03/11/2013 1443   PROT 7.1 10/16/2020 1322   PROT 7.1 03/11/2013 1443   ALBUMIN 3.9 10/16/2020 1322   ALBUMIN 3.4 (L) 03/11/2013 1443   AST 21 10/16/2020 1322   AST 16 10/14/2019 1501   AST 14 03/11/2013 1443   ALT 14 10/16/2020 1322   ALT 9 10/14/2019 1501   ALT 10 03/11/2013 1443  ALKPHOS 77 10/16/2020 1322   ALKPHOS 83 03/11/2013 1443   BILITOT  0.5 10/16/2020 1322   BILITOT 0.5 10/14/2019 1501   BILITOT 0.52 03/11/2013 1443   GFRNONAA >60 10/16/2020 1322   GFRNONAA >60 10/14/2019 1501   GFRAA >60 10/14/2019 1501    No results found for: TOTALPROTELP, ALBUMINELP, A1GS, A2GS, BETS, BETA2SER, GAMS, MSPIKE, SPEI  No results found for: KPAFRELGTCHN, LAMBDASER, KAPLAMBRATIO  Lab Results  Component Value Date   WBC 6.3 10/16/2020   NEUTROABS 4.2 10/16/2020   HGB 14.6 10/16/2020   HCT 44.0 10/16/2020   MCV 88.7 10/16/2020   PLT 170 10/16/2020   No results found for: LABCA2  No components found for: [id number]  No results for input(s): INR in the last 168 hours.  No results found for: LABCA2  No results found for: FMB846  No results found for: KZL935  No results found for: TSV779  No results found for: CA2729  No components found for: HGQUANT  No results found for: CEA1 / No results found for: CEA1   No results found for: AFPTUMOR  No results found for: CHROMOGRNA  No results found for: HGBA, HGBA2QUANT, HGBFQUANT, HGBSQUAN (Hemoglobinopathy evaluation)   No results found for: LDH  No results found for: IRON, TIBC, IRONPCTSAT (Iron and TIBC)  No results found for: FERRITIN  Urinalysis    Component Value Date/Time   COLORURINE YELLOW 07/26/2017 1411   APPEARANCEUR HAZY (A) 07/26/2017 1411   LABSPEC 1.019 07/26/2017 1411   PHURINE 5.0 07/26/2017 1411   GLUCOSEU NEGATIVE 07/26/2017 1411   HGBUR MODERATE (A) 07/26/2017 1411   BILIRUBINUR NEGATIVE 07/26/2017 1411   KETONESUR 5 (A) 07/26/2017 1411   PROTEINUR NEGATIVE 07/26/2017 1411   UROBILINOGEN 0.2 01/08/2012 1047   NITRITE NEGATIVE 07/26/2017 1411   LEUKOCYTESUR TRACE (A) 07/26/2017 1411    STUDIES:  No results found.   ELIGIBLE FOR AVAILABLE RESEARCH PROTOCOL: no   ASSESSMENT: 75 y.o.  [place] woman status post bilateral mastectomies 04/23/2013, showing  (1) on the right, no evidence of malignancy (prior biopsy showed ADH)  (2) on  the left, an mpT2 pN1, stage IB invasive ductal carcinoma, grade 2, estrogen and progesterone receptor positive, with an MIB-1 between 69 and 71%, with negative margins  (a) 1 of the 2 tumors sampled (at 12:00) was HER-2 positive with a signals ratio of 3.00; the second tumor was HER-2 negative with a ratio of 1.45.  (b) 2 of 14 left axillary lymph nodes were involved with macrometastatic deposits  (3) treated adjuvantly between 05/28/2013 and 07/30/2013 with cyclophosphamide and doxorubicin given every 21 days x 4, followed by paclitaxel every 21 days x 4 given with trastuzumab, started 08/20/2013, completing 4 cycles of paclitaxel (dose reduced) February 2015  (4) trastuzumab continued to total 1 year (completed February 2016)  (a) trastuzumab held August-September 2014 due to small drop in ejection fraction; resumed October 2014  (5) anastrozole started February 2015, completing 7 years JAN 2022  (a) osteopenia with a T score of -1.9 on bone density scan 02/25/2017  (b) status post alendronate; discontinued secondary to reflux  (c) currently on yearly zoledronate through Dr. Raul Del office   PLAN: Takasha is now 7-1/2 years out from definitive surgery for her breast cancer with no evidence of disease recurrence.  This is very favorable.  She has completed 7 years of anastrozole.  We reviewed the data which shows that comparing 7 years with 10 years of anastrozole the additional 3 years of her no further  protection.  Accordingly we are stopping anastrozole at this point.  I am comfortable releasing her to her primary care physician's care.  All she will need in terms of breast cancer follow-up is a yearly physician chest wall exam  I will be glad to see Ysela again at any point in the future if and when the need arises but as of now are making no further routine appointments for her here  Total encounter time 20 minutes.*  Kagan Mutchler, Virgie Dad, MD  10/16/20 2:19 PM Medical Oncology and  Hematology [facility] [place], [place] [address] Tel. [phone]    Fax. [phone]    I, Wilburn Mylar, am acting as scribe for Dr. Virgie Dad. Makya Yurko.  I, Lurline Del MD, have reviewed the above documentation for accuracy and completeness, and I agree with the above.   *Total Encounter Time as defined by the Centers for Medicare and Medicaid Services includes, in addition to the face-to-face time of a patient visit (documented in the note above) non-face-to-face time: obtaining and reviewing outside history, ordering and reviewing medications, tests or procedures, care coordination (communications with other health care professionals or caregivers) and documentation in the medical record.

## 2020-10-16 ENCOUNTER — Inpatient Hospital Stay: Payer: Medicare PPO

## 2020-10-16 ENCOUNTER — Other Ambulatory Visit: Payer: Self-pay

## 2020-10-16 ENCOUNTER — Inpatient Hospital Stay: Payer: Medicare PPO | Attending: Oncology | Admitting: Oncology

## 2020-10-16 VITALS — BP 102/61 | HR 80 | Temp 97.8°F | Resp 18 | Ht 64.0 in | Wt 174.6 lb

## 2020-10-16 DIAGNOSIS — C50812 Malignant neoplasm of overlapping sites of left female breast: Secondary | ICD-10-CM

## 2020-10-16 DIAGNOSIS — E782 Mixed hyperlipidemia: Secondary | ICD-10-CM

## 2020-10-16 DIAGNOSIS — Z170 Estrogen receptor positive status [ER+]: Secondary | ICD-10-CM

## 2020-10-16 DIAGNOSIS — K219 Gastro-esophageal reflux disease without esophagitis: Secondary | ICD-10-CM | POA: Insufficient documentation

## 2020-10-16 DIAGNOSIS — Z7982 Long term (current) use of aspirin: Secondary | ICD-10-CM | POA: Diagnosis not present

## 2020-10-16 DIAGNOSIS — E559 Vitamin D deficiency, unspecified: Secondary | ICD-10-CM

## 2020-10-16 DIAGNOSIS — R011 Cardiac murmur, unspecified: Secondary | ICD-10-CM | POA: Insufficient documentation

## 2020-10-16 DIAGNOSIS — I509 Heart failure, unspecified: Secondary | ICD-10-CM | POA: Diagnosis not present

## 2020-10-16 DIAGNOSIS — Z90722 Acquired absence of ovaries, bilateral: Secondary | ICD-10-CM | POA: Insufficient documentation

## 2020-10-16 DIAGNOSIS — R232 Flushing: Secondary | ICD-10-CM | POA: Insufficient documentation

## 2020-10-16 DIAGNOSIS — Z90710 Acquired absence of both cervix and uterus: Secondary | ICD-10-CM | POA: Insufficient documentation

## 2020-10-16 DIAGNOSIS — G4730 Sleep apnea, unspecified: Secondary | ICD-10-CM | POA: Diagnosis not present

## 2020-10-16 DIAGNOSIS — Z9013 Acquired absence of bilateral breasts and nipples: Secondary | ICD-10-CM | POA: Insufficient documentation

## 2020-10-16 DIAGNOSIS — M8580 Other specified disorders of bone density and structure, unspecified site: Secondary | ICD-10-CM | POA: Diagnosis not present

## 2020-10-16 DIAGNOSIS — G5600 Carpal tunnel syndrome, unspecified upper limb: Secondary | ICD-10-CM

## 2020-10-16 DIAGNOSIS — Z79899 Other long term (current) drug therapy: Secondary | ICD-10-CM | POA: Diagnosis not present

## 2020-10-16 DIAGNOSIS — Z79811 Long term (current) use of aromatase inhibitors: Secondary | ICD-10-CM | POA: Diagnosis not present

## 2020-10-16 DIAGNOSIS — C773 Secondary and unspecified malignant neoplasm of axilla and upper limb lymph nodes: Secondary | ICD-10-CM | POA: Insufficient documentation

## 2020-10-16 DIAGNOSIS — E785 Hyperlipidemia, unspecified: Secondary | ICD-10-CM | POA: Insufficient documentation

## 2020-10-16 DIAGNOSIS — C50212 Malignant neoplasm of upper-inner quadrant of left female breast: Secondary | ICD-10-CM | POA: Insufficient documentation

## 2020-10-16 DIAGNOSIS — M5431 Sciatica, right side: Secondary | ICD-10-CM

## 2020-10-16 LAB — COMPREHENSIVE METABOLIC PANEL
ALT: 14 U/L (ref 0–44)
AST: 21 U/L (ref 15–41)
Albumin: 3.9 g/dL (ref 3.5–5.0)
Alkaline Phosphatase: 77 U/L (ref 38–126)
Anion gap: 7 (ref 5–15)
BUN: 19 mg/dL (ref 8–23)
CO2: 30 mmol/L (ref 22–32)
Calcium: 9.7 mg/dL (ref 8.9–10.3)
Chloride: 105 mmol/L (ref 98–111)
Creatinine, Ser: 0.84 mg/dL (ref 0.44–1.00)
GFR, Estimated: >60 mL/min (ref >60)
Glucose, Bld: 106 mg/dL — ABNORMAL HIGH (ref 70–99)
Potassium: 4.3 mmol/L (ref 3.5–5.1)
Sodium: 142 mmol/L (ref 135–145)
Total Bilirubin: 0.5 mg/dL (ref 0.3–1.2)
Total Protein: 7.1 g/dL (ref 6.5–8.1)

## 2020-10-16 LAB — CBC WITH DIFFERENTIAL/PLATELET
Abs Immature Granulocytes: 0.02 10*3/uL (ref 0.00–0.07)
Basophils Absolute: 0.1 10*3/uL (ref 0.0–0.1)
Basophils Relative: 1 %
Eosinophils Absolute: 0.2 10*3/uL (ref 0.0–0.5)
Eosinophils Relative: 3 %
HCT: 44 % (ref 36.0–46.0)
Hemoglobin: 14.6 g/dL (ref 12.0–15.0)
Immature Granulocytes: 0 %
Lymphocytes Relative: 23 %
Lymphs Abs: 1.4 10*3/uL (ref 0.7–4.0)
MCH: 29.4 pg (ref 26.0–34.0)
MCHC: 33.2 g/dL (ref 30.0–36.0)
MCV: 88.7 fL (ref 80.0–100.0)
Monocytes Absolute: 0.4 10*3/uL (ref 0.1–1.0)
Monocytes Relative: 7 %
Neutro Abs: 4.2 10*3/uL (ref 1.7–7.7)
Neutrophils Relative %: 66 %
Platelets: 170 10*3/uL (ref 150–400)
RBC: 4.96 MIL/uL (ref 3.87–5.11)
RDW: 12.5 % (ref 11.5–15.5)
WBC: 6.3 10*3/uL (ref 4.0–10.5)
nRBC: 0 % (ref 0.0–0.2)

## 2020-10-17 ENCOUNTER — Telehealth: Payer: Self-pay | Admitting: Oncology

## 2020-10-17 NOTE — Telephone Encounter (Signed)
No 1/31 los. No changes made to pt's schedule.  

## 2020-11-09 ENCOUNTER — Other Ambulatory Visit (HOSPITAL_COMMUNITY): Payer: Self-pay | Admitting: *Deleted

## 2020-11-10 ENCOUNTER — Other Ambulatory Visit: Payer: Self-pay

## 2020-11-10 ENCOUNTER — Encounter (HOSPITAL_COMMUNITY)
Admission: RE | Admit: 2020-11-10 | Discharge: 2020-11-10 | Disposition: A | Discharge status: Home / Self Care | Payer: Medicare PPO | Source: Ambulatory Visit | Attending: Internal Medicine | Admitting: Internal Medicine

## 2020-11-10 DIAGNOSIS — M810 Age-related osteoporosis without current pathological fracture: Secondary | ICD-10-CM | POA: Diagnosis not present

## 2020-11-10 MED ORDER — ZOLEDRONIC ACID 5 MG/100ML IV SOLN
INTRAVENOUS | Status: AC
  Administered 2020-11-10: 5 mg via INTRAVENOUS
  Filled 2020-11-10: qty 100

## 2020-11-10 MED ORDER — ZOLEDRONIC ACID 5 MG/100ML IV SOLN: 5.0000 mg | Freq: Once | INTRAVENOUS | Status: AC

## 2020-12-07 DIAGNOSIS — H25042 Posterior subcapsular polar age-related cataract, left eye: Secondary | ICD-10-CM | POA: Diagnosis not present

## 2020-12-07 DIAGNOSIS — H2513 Age-related nuclear cataract, bilateral: Secondary | ICD-10-CM | POA: Diagnosis not present

## 2020-12-07 DIAGNOSIS — H16223 Keratoconjunctivitis sicca, not specified as Sjogren's, bilateral: Secondary | ICD-10-CM | POA: Diagnosis not present

## 2021-04-04 DIAGNOSIS — Z9011 Acquired absence of right breast and nipple: Secondary | ICD-10-CM | POA: Diagnosis not present

## 2021-04-04 DIAGNOSIS — C50912 Malignant neoplasm of unspecified site of left female breast: Secondary | ICD-10-CM | POA: Diagnosis not present

## 2021-05-31 DIAGNOSIS — E785 Hyperlipidemia, unspecified: Secondary | ICD-10-CM | POA: Diagnosis not present

## 2021-05-31 DIAGNOSIS — E559 Vitamin D deficiency, unspecified: Secondary | ICD-10-CM | POA: Diagnosis not present

## 2021-05-31 DIAGNOSIS — E041 Nontoxic single thyroid nodule: Secondary | ICD-10-CM | POA: Diagnosis not present

## 2021-05-31 DIAGNOSIS — R7301 Impaired fasting glucose: Secondary | ICD-10-CM | POA: Diagnosis not present

## 2021-06-21 DIAGNOSIS — F3342 Major depressive disorder, recurrent, in full remission: Secondary | ICD-10-CM | POA: Diagnosis not present

## 2021-06-21 DIAGNOSIS — E669 Obesity, unspecified: Secondary | ICD-10-CM | POA: Diagnosis not present

## 2021-06-21 DIAGNOSIS — M8580 Other specified disorders of bone density and structure, unspecified site: Secondary | ICD-10-CM | POA: Diagnosis not present

## 2021-06-21 DIAGNOSIS — R7301 Impaired fasting glucose: Secondary | ICD-10-CM | POA: Diagnosis not present

## 2021-06-21 DIAGNOSIS — E785 Hyperlipidemia, unspecified: Secondary | ICD-10-CM | POA: Diagnosis not present

## 2021-06-21 DIAGNOSIS — Z1339 Encounter for screening examination for other mental health and behavioral disorders: Secondary | ICD-10-CM | POA: Diagnosis not present

## 2021-06-21 DIAGNOSIS — Z0000 Encounter for general adult medical examination without abnormal findings: Secondary | ICD-10-CM | POA: Diagnosis not present

## 2021-06-21 DIAGNOSIS — R82998 Other abnormal findings in urine: Secondary | ICD-10-CM | POA: Diagnosis not present

## 2021-06-21 DIAGNOSIS — Z1331 Encounter for screening for depression: Secondary | ICD-10-CM | POA: Diagnosis not present

## 2021-06-21 DIAGNOSIS — G4733 Obstructive sleep apnea (adult) (pediatric): Secondary | ICD-10-CM | POA: Diagnosis not present

## 2021-06-21 DIAGNOSIS — Z23 Encounter for immunization: Secondary | ICD-10-CM | POA: Diagnosis not present

## 2021-06-21 DIAGNOSIS — E559 Vitamin D deficiency, unspecified: Secondary | ICD-10-CM | POA: Diagnosis not present

## 2021-06-21 DIAGNOSIS — I427 Cardiomyopathy due to drug and external agent: Secondary | ICD-10-CM | POA: Diagnosis not present

## 2021-07-10 IMAGING — CT CT HEART SCORING
3 series · 14 of 20 positions shown, 16 images · non-contrast
Comparison: None.

CLINICAL DATA: Elevated LDL.

EXAM:
CT HEART FOR CALCIUM SCORING
TECHNIQUE: CT heart was performed using prospective ECG gating.
A non-contrast exam for calcium scoring was performed.
Note that this exam targets the heart and the chest was not imaged
in its entirety.

[Series 2: calcium scoring 2.00 qr36 bestdiast 72% · axial · 0.35mm/px · z∈[+1629,+1701]mm · 4 of 60 slices shown]
[im 12/60  vessel]
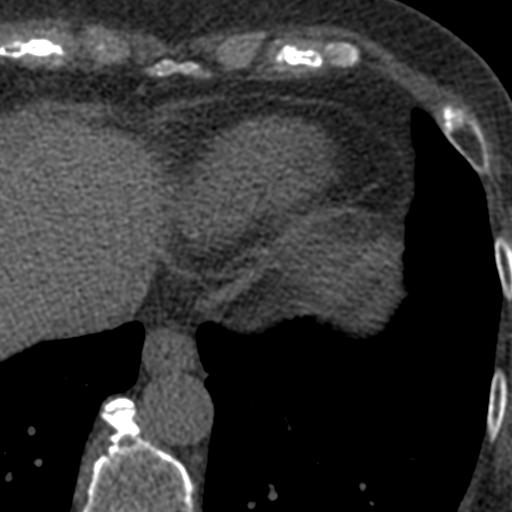
[im 24/60  vessel]
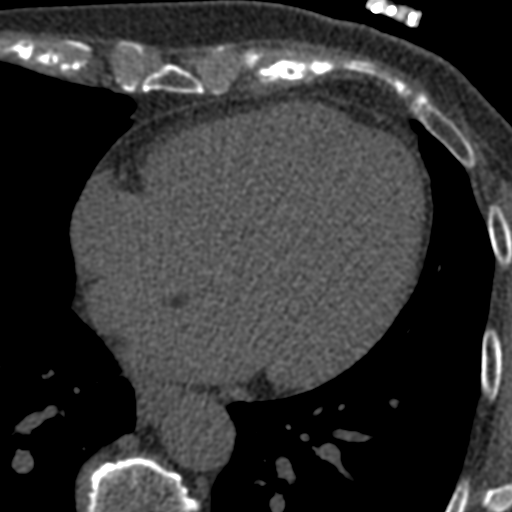
[im 36/60  vessel]
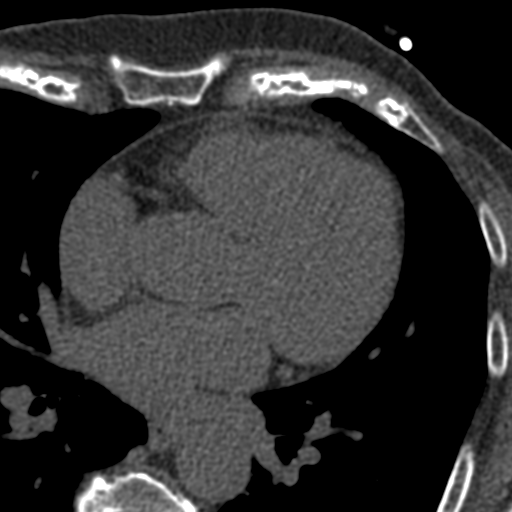
[im 48/60  vessel]
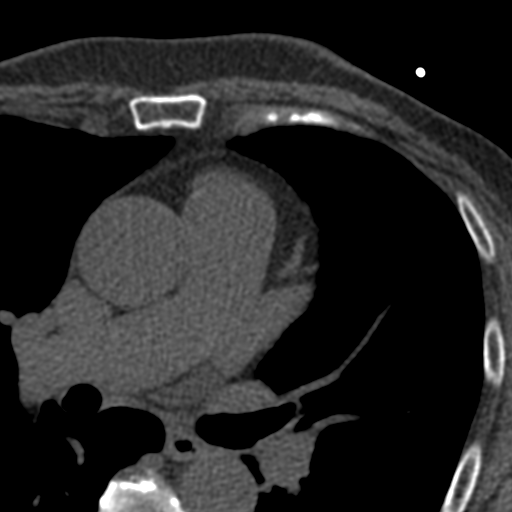

[Series 3: calcium scoring 2.00 br40 bestdiast 72% fov · axial · 0.58mm/px · z∈[+1625,+1705]mm · 5 of 60 slices shown, 7 images]
[im 10/60  vessel]
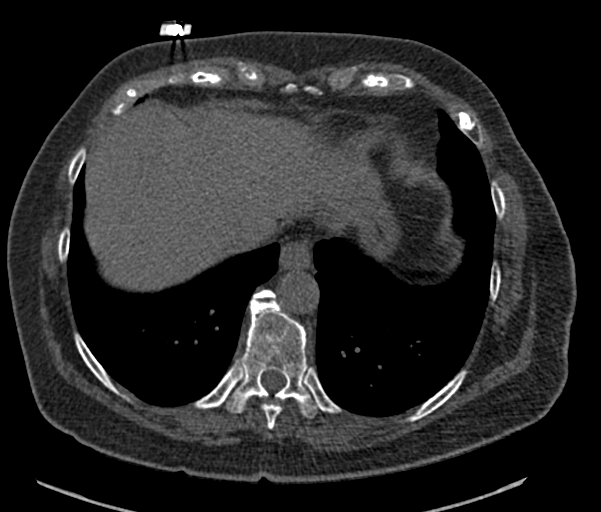
[im 10/60  lung]
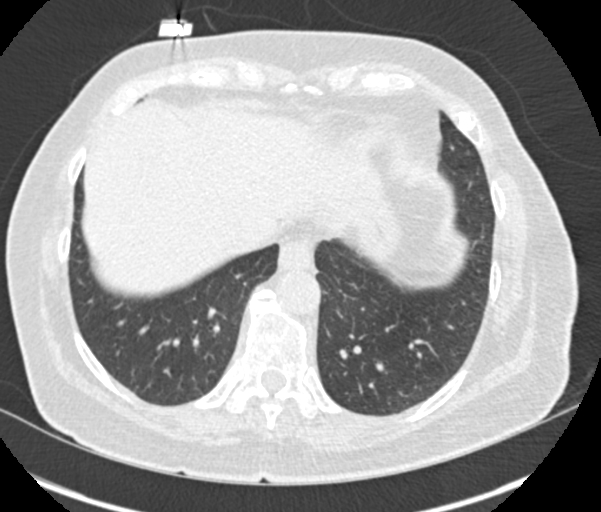
[im 20/60  vessel]
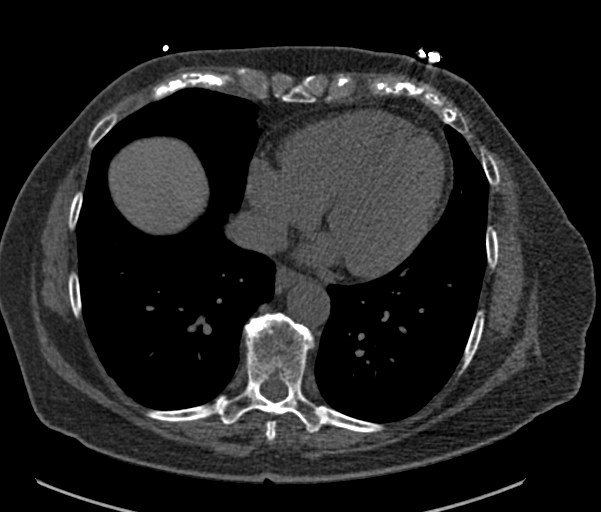
[im 30/60  vessel]
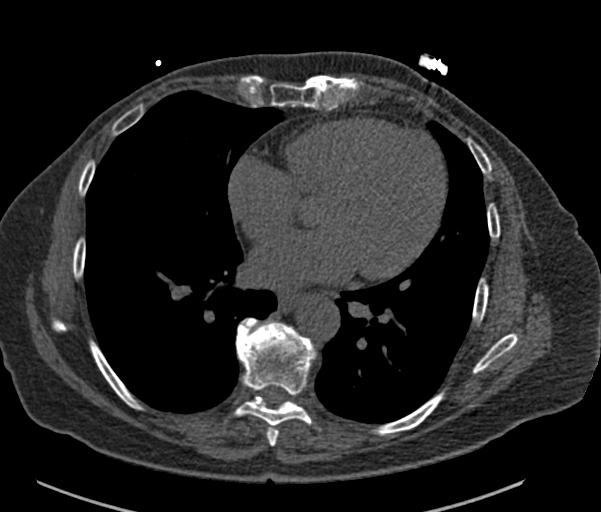
[im 40/60  vessel]
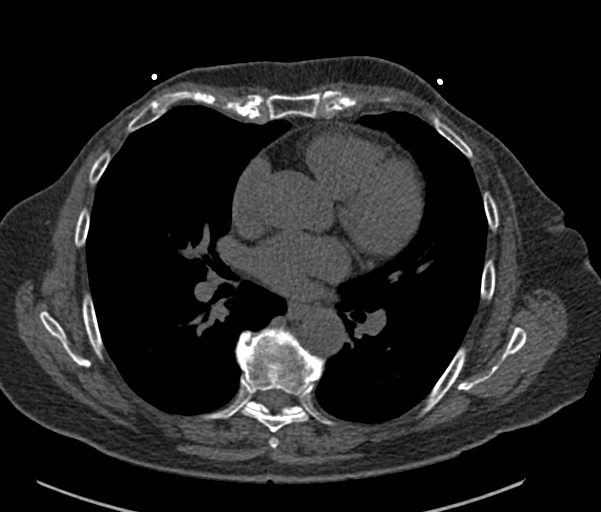
[im 50/60  vessel]
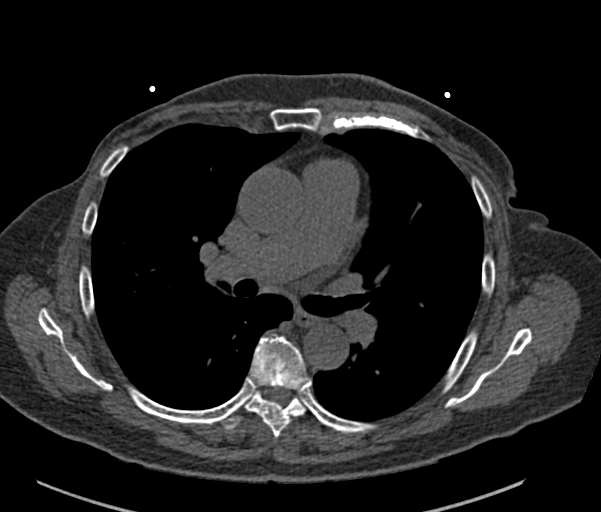
[im 50/60  lung]
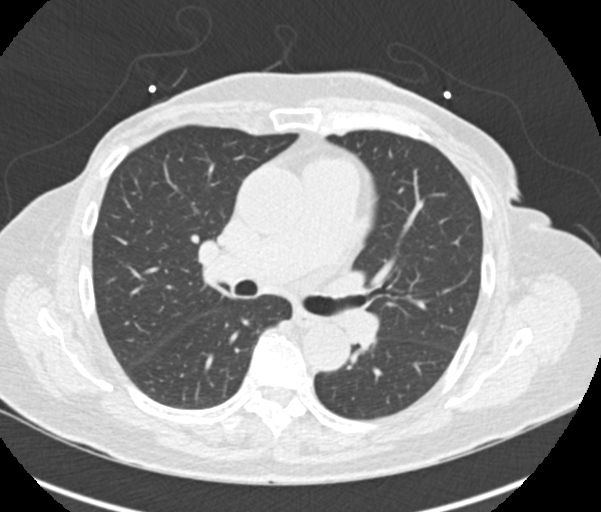

[Series 9: calcium scoring 2.00 br60 bestdiast 72% fov · axial · 0.58mm/px · z∈[+1625,+1705]mm · 5 of 60 slices shown]
[im 10/60  vessel]
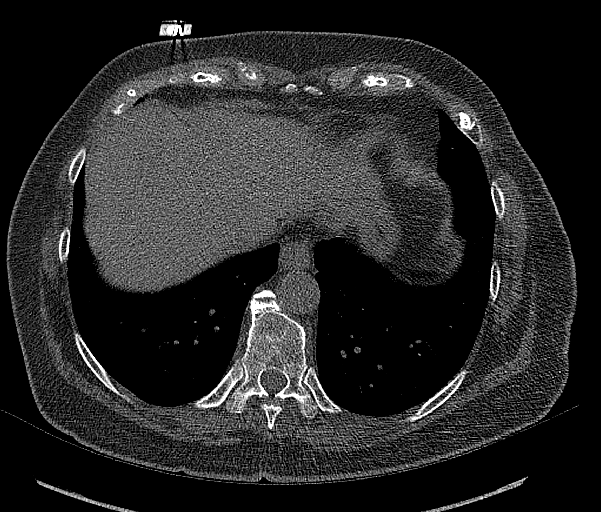
[im 20/60  vessel]
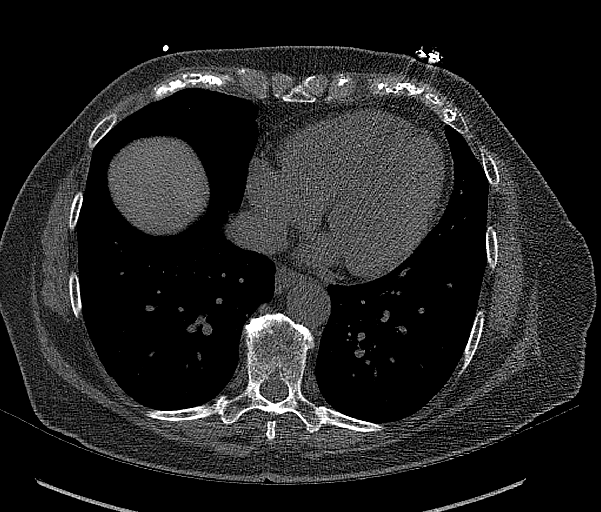
[im 30/60  vessel]
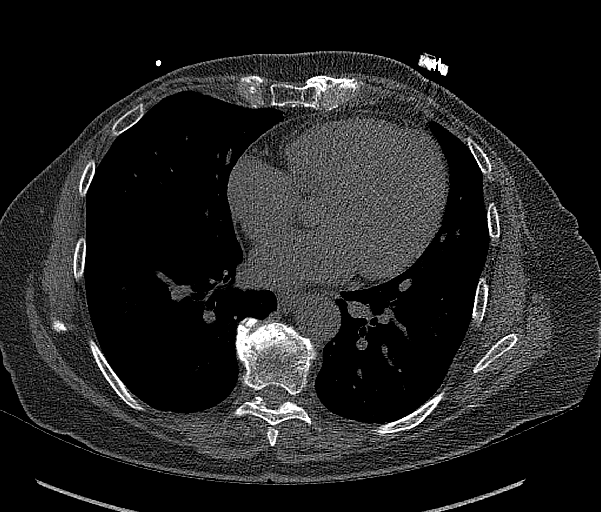
[im 40/60  vessel]
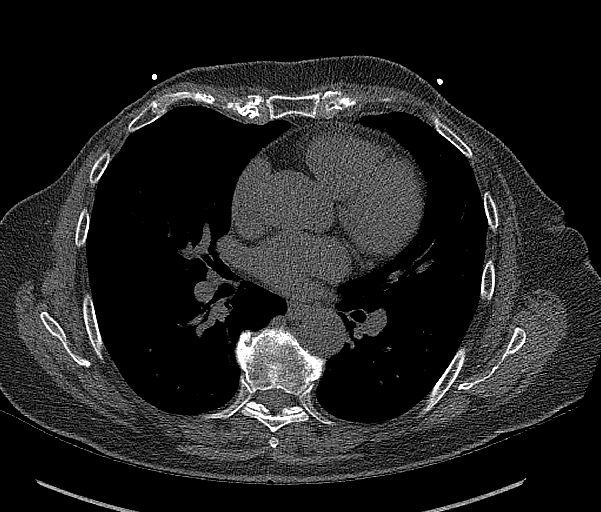
[im 50/60  vessel]
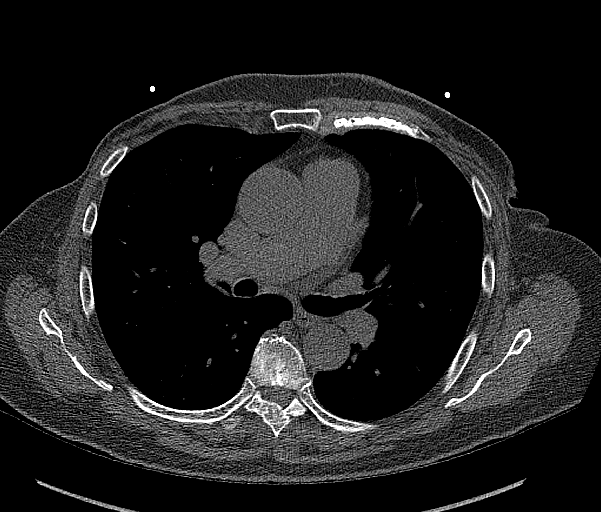

[14 of 20 positions shown; findings below may reference images not displayed]

FINDINGS: Technical quality: Good.

CORONARY CALCIUM

Total Agatston Score: 0

[HOSPITAL] percentile:  0

OTHER FINDINGS:

Cardiovascular: Small calcification in the aortic root. Aorta is
normal caliber. Heart is normal size.

Mediastinum/Nodes: No adenopathy in the lower mediastinum or hila.

Lungs/Pleura: Visualized lungs clear.  No effusions.

Upper Abdomen: Imaging into the upper abdomen shows no acute
findings.

Musculoskeletal: Chest wall soft tissues are unremarkable. Prior
bilateral mastectomies. No acute bony abnormality.
IMPRESSION: No visible coronary artery calcifications. Total coronary calcium
score of 0.

Punctate calcification in the aortic root.

## 2021-07-31 DIAGNOSIS — M8589 Other specified disorders of bone density and structure, multiple sites: Secondary | ICD-10-CM | POA: Diagnosis not present

## 2021-10-18 DIAGNOSIS — R8290 Unspecified abnormal findings in urine: Secondary | ICD-10-CM | POA: Diagnosis not present

## 2021-10-18 DIAGNOSIS — R300 Dysuria: Secondary | ICD-10-CM | POA: Diagnosis not present

## 2021-12-27 DIAGNOSIS — R69 Illness, unspecified: Secondary | ICD-10-CM | POA: Diagnosis not present

## 2022-01-14 DIAGNOSIS — R350 Frequency of micturition: Secondary | ICD-10-CM | POA: Diagnosis not present

## 2022-01-14 DIAGNOSIS — R300 Dysuria: Secondary | ICD-10-CM | POA: Diagnosis not present

## 2022-01-14 DIAGNOSIS — N390 Urinary tract infection, site not specified: Secondary | ICD-10-CM | POA: Diagnosis not present

## 2022-03-01 DIAGNOSIS — L570 Actinic keratosis: Secondary | ICD-10-CM | POA: Diagnosis not present

## 2022-03-01 DIAGNOSIS — B355 Tinea imbricata: Secondary | ICD-10-CM | POA: Diagnosis not present

## 2022-03-01 DIAGNOSIS — X32XXXA Exposure to sunlight, initial encounter: Secondary | ICD-10-CM | POA: Diagnosis not present

## 2022-03-27 DIAGNOSIS — N390 Urinary tract infection, site not specified: Secondary | ICD-10-CM | POA: Diagnosis not present

## 2022-03-27 DIAGNOSIS — N952 Postmenopausal atrophic vaginitis: Secondary | ICD-10-CM | POA: Diagnosis not present

## 2022-04-23 DIAGNOSIS — H16223 Keratoconjunctivitis sicca, not specified as Sjogren's, bilateral: Secondary | ICD-10-CM | POA: Diagnosis not present

## 2022-04-23 DIAGNOSIS — H5203 Hypermetropia, bilateral: Secondary | ICD-10-CM | POA: Diagnosis not present

## 2022-04-23 DIAGNOSIS — H04123 Dry eye syndrome of bilateral lacrimal glands: Secondary | ICD-10-CM | POA: Diagnosis not present

## 2022-04-23 DIAGNOSIS — H52223 Regular astigmatism, bilateral: Secondary | ICD-10-CM | POA: Diagnosis not present

## 2022-04-23 DIAGNOSIS — H524 Presbyopia: Secondary | ICD-10-CM | POA: Diagnosis not present

## 2022-04-23 DIAGNOSIS — H2513 Age-related nuclear cataract, bilateral: Secondary | ICD-10-CM | POA: Diagnosis not present

## 2022-11-08 DIAGNOSIS — R002 Palpitations: Secondary | ICD-10-CM | POA: Diagnosis not present

## 2022-11-08 DIAGNOSIS — E559 Vitamin D deficiency, unspecified: Secondary | ICD-10-CM | POA: Diagnosis not present

## 2022-11-08 DIAGNOSIS — K219 Gastro-esophageal reflux disease without esophagitis: Secondary | ICD-10-CM | POA: Diagnosis not present

## 2022-11-08 DIAGNOSIS — E041 Nontoxic single thyroid nodule: Secondary | ICD-10-CM | POA: Diagnosis not present

## 2022-11-08 DIAGNOSIS — R7301 Impaired fasting glucose: Secondary | ICD-10-CM | POA: Diagnosis not present

## 2022-11-08 DIAGNOSIS — E785 Hyperlipidemia, unspecified: Secondary | ICD-10-CM | POA: Diagnosis not present

## 2022-11-15 DIAGNOSIS — R82998 Other abnormal findings in urine: Secondary | ICD-10-CM | POA: Diagnosis not present

## 2022-11-15 DIAGNOSIS — F3342 Major depressive disorder, recurrent, in full remission: Secondary | ICD-10-CM | POA: Diagnosis not present

## 2022-11-15 DIAGNOSIS — R7301 Impaired fasting glucose: Secondary | ICD-10-CM | POA: Diagnosis not present

## 2022-11-15 DIAGNOSIS — Z1339 Encounter for screening examination for other mental health and behavioral disorders: Secondary | ICD-10-CM | POA: Diagnosis not present

## 2022-11-15 DIAGNOSIS — I427 Cardiomyopathy due to drug and external agent: Secondary | ICD-10-CM | POA: Diagnosis not present

## 2022-11-15 DIAGNOSIS — M5416 Radiculopathy, lumbar region: Secondary | ICD-10-CM | POA: Diagnosis not present

## 2022-11-15 DIAGNOSIS — M8580 Other specified disorders of bone density and structure, unspecified site: Secondary | ICD-10-CM | POA: Diagnosis not present

## 2022-11-15 DIAGNOSIS — Z1331 Encounter for screening for depression: Secondary | ICD-10-CM | POA: Diagnosis not present

## 2022-11-15 DIAGNOSIS — Z86010 Personal history of colonic polyps: Secondary | ICD-10-CM | POA: Diagnosis not present

## 2022-11-15 DIAGNOSIS — Z23 Encounter for immunization: Secondary | ICD-10-CM | POA: Diagnosis not present

## 2022-11-15 DIAGNOSIS — Z0000 Encounter for general adult medical examination without abnormal findings: Secondary | ICD-10-CM | POA: Diagnosis not present

## 2022-11-15 DIAGNOSIS — E669 Obesity, unspecified: Secondary | ICD-10-CM | POA: Diagnosis not present

## 2022-11-15 DIAGNOSIS — E785 Hyperlipidemia, unspecified: Secondary | ICD-10-CM | POA: Diagnosis not present

## 2023-02-02 DIAGNOSIS — L03213 Periorbital cellulitis: Secondary | ICD-10-CM | POA: Diagnosis not present

## 2023-02-02 DIAGNOSIS — U071 COVID-19: Secondary | ICD-10-CM | POA: Diagnosis not present

## 2023-02-02 DIAGNOSIS — H1032 Unspecified acute conjunctivitis, left eye: Secondary | ICD-10-CM | POA: Diagnosis not present

## 2023-02-02 DIAGNOSIS — J019 Acute sinusitis, unspecified: Secondary | ICD-10-CM | POA: Diagnosis not present

## 2023-02-18 DIAGNOSIS — C50912 Malignant neoplasm of unspecified site of left female breast: Secondary | ICD-10-CM | POA: Diagnosis not present

## 2023-02-18 DIAGNOSIS — Z9011 Acquired absence of right breast and nipple: Secondary | ICD-10-CM | POA: Diagnosis not present

## 2023-02-19 DIAGNOSIS — Z9011 Acquired absence of right breast and nipple: Secondary | ICD-10-CM | POA: Diagnosis not present

## 2023-02-19 DIAGNOSIS — C50912 Malignant neoplasm of unspecified site of left female breast: Secondary | ICD-10-CM | POA: Diagnosis not present

## 2023-04-24 DIAGNOSIS — H2513 Age-related nuclear cataract, bilateral: Secondary | ICD-10-CM | POA: Diagnosis not present

## 2023-09-04 DIAGNOSIS — H25812 Combined forms of age-related cataract, left eye: Secondary | ICD-10-CM | POA: Diagnosis not present

## 2023-10-07 DIAGNOSIS — H25812 Combined forms of age-related cataract, left eye: Secondary | ICD-10-CM | POA: Diagnosis not present

## 2023-10-16 DIAGNOSIS — H2511 Age-related nuclear cataract, right eye: Secondary | ICD-10-CM | POA: Diagnosis not present

## 2023-10-21 DIAGNOSIS — H25811 Combined forms of age-related cataract, right eye: Secondary | ICD-10-CM | POA: Diagnosis not present

## 2023-11-25 DIAGNOSIS — E785 Hyperlipidemia, unspecified: Secondary | ICD-10-CM | POA: Diagnosis not present

## 2023-11-25 DIAGNOSIS — E041 Nontoxic single thyroid nodule: Secondary | ICD-10-CM | POA: Diagnosis not present

## 2023-11-25 DIAGNOSIS — R7301 Impaired fasting glucose: Secondary | ICD-10-CM | POA: Diagnosis not present

## 2023-11-25 DIAGNOSIS — Z1212 Encounter for screening for malignant neoplasm of rectum: Secondary | ICD-10-CM | POA: Diagnosis not present

## 2023-11-25 DIAGNOSIS — E559 Vitamin D deficiency, unspecified: Secondary | ICD-10-CM | POA: Diagnosis not present

## 2023-11-25 DIAGNOSIS — E7849 Other hyperlipidemia: Secondary | ICD-10-CM | POA: Diagnosis not present

## 2023-11-30 DIAGNOSIS — E041 Nontoxic single thyroid nodule: Secondary | ICD-10-CM | POA: Diagnosis not present

## 2023-11-30 DIAGNOSIS — E785 Hyperlipidemia, unspecified: Secondary | ICD-10-CM | POA: Diagnosis not present

## 2023-11-30 DIAGNOSIS — Z1212 Encounter for screening for malignant neoplasm of rectum: Secondary | ICD-10-CM | POA: Diagnosis not present

## 2023-12-04 DIAGNOSIS — G4733 Obstructive sleep apnea (adult) (pediatric): Secondary | ICD-10-CM | POA: Diagnosis not present

## 2023-12-04 DIAGNOSIS — E785 Hyperlipidemia, unspecified: Secondary | ICD-10-CM | POA: Diagnosis not present

## 2023-12-04 DIAGNOSIS — R82998 Other abnormal findings in urine: Secondary | ICD-10-CM | POA: Diagnosis not present

## 2023-12-04 DIAGNOSIS — Z1339 Encounter for screening examination for other mental health and behavioral disorders: Secondary | ICD-10-CM | POA: Diagnosis not present

## 2023-12-04 DIAGNOSIS — N952 Postmenopausal atrophic vaginitis: Secondary | ICD-10-CM | POA: Diagnosis not present

## 2023-12-04 DIAGNOSIS — R7301 Impaired fasting glucose: Secondary | ICD-10-CM | POA: Diagnosis not present

## 2023-12-04 DIAGNOSIS — I427 Cardiomyopathy due to drug and external agent: Secondary | ICD-10-CM | POA: Diagnosis not present

## 2023-12-04 DIAGNOSIS — M8580 Other specified disorders of bone density and structure, unspecified site: Secondary | ICD-10-CM | POA: Diagnosis not present

## 2023-12-04 DIAGNOSIS — F3342 Major depressive disorder, recurrent, in full remission: Secondary | ICD-10-CM | POA: Diagnosis not present

## 2023-12-04 DIAGNOSIS — E669 Obesity, unspecified: Secondary | ICD-10-CM | POA: Diagnosis not present

## 2023-12-04 DIAGNOSIS — Z1331 Encounter for screening for depression: Secondary | ICD-10-CM | POA: Diagnosis not present

## 2023-12-04 DIAGNOSIS — Z860101 Personal history of adenomatous and serrated colon polyps: Secondary | ICD-10-CM | POA: Diagnosis not present

## 2023-12-04 DIAGNOSIS — Z0000 Encounter for general adult medical examination without abnormal findings: Secondary | ICD-10-CM | POA: Diagnosis not present

## 2023-12-05 ENCOUNTER — Ambulatory Visit (INDEPENDENT_AMBULATORY_CARE_PROVIDER_SITE_OTHER): Payer: Self-pay

## 2023-12-05 ENCOUNTER — Encounter: Payer: Self-pay | Admitting: Orthopedic Surgery

## 2023-12-05 ENCOUNTER — Ambulatory Visit: Admitting: Orthopedic Surgery

## 2023-12-05 ENCOUNTER — Telehealth: Payer: Self-pay

## 2023-12-05 DIAGNOSIS — M79671 Pain in right foot: Secondary | ICD-10-CM

## 2023-12-05 DIAGNOSIS — M25562 Pain in left knee: Secondary | ICD-10-CM

## 2023-12-05 NOTE — Telephone Encounter (Signed)
 Please precert for left knee gel injections. Dr.Deans patient. Thanks

## 2023-12-05 NOTE — Progress Notes (Signed)
 Office Visit Note   Patient: [name]           Date of Birth: 04-12-1946           MRN: [id number] Visit Date: 12/05/2023 Requested by: Cleatis Polka., MD [address] [place],  [place] [address] PCP: Cleatis Polka., MD  Subjective: Chief Complaint  Patient presents with   Left Knee - Pain   Right Foot - Pain    Heel pain   HPI: [name] is a 78 y.o. female who presents to the office reporting right heel pain of 1 month duration and left knee pain of 3 weeks duration.  Patient reports right heel pain with no history of injury.  Tried inserts without much relief.  She does report some local swelling on the posterior aspect of her calcaneus.  Does not describe increased difficulty with ambulation.  Taking ibuprofen which gives her minimal relief.  Patient also reports left knee pain.  Medial sided pain for the past 3 weeks.  Feels like it possibly is symptomatic because of the way she is walking due to her right foot.  This pain in her knee does wake her from sleep at night.  Reports some popping but no giving way..                ROS: All systems reviewed are negative as they relate to the chief complaint within the history of present illness.  Patient denies fevers or chills.  Assessment & Plan: Visit Diagnoses:  1. Left knee pain, unspecified chronicity   2. Intractable right heel pain     Plan: Impression is right heel pain of 1 month duration with insertional Achilles tendinitis.  There is a slight amount of calcium proximal to the tendon attachment site.  I think this may be symptomatic.  No intervention required at this time as she does have a good insert.  We did try adding 1 very thin heel pad just to elevate the heel a little bit to take some of the tension off of the heel cord.  Regarding the left knee she does have arthritis.  Aspiration and injection of the knee is performed today.  Preapproved for gel shots.  She is not really symptomatic enough yet  for knee replacement but I think she may develop that in a few years.  Follow-Up Instructions: No follow-ups on file.   Orders:  Orders Placed This Encounter  Procedures   XR Knee 1-2 Views Left   XR Os Calcis Right   No orders of the defined types were placed in this encounter.     Procedures: No procedures performed   Clinical Data: No additional findings.  Objective: Vital Signs: There were no vitals taken for this visit.  Physical Exam:  Constitutional: Patient appears well-developed HEENT:  Head: Normocephalic Eyes:EOM are normal Neck: Normal range of motion Cardiovascular: Normal rate Pulmonary/chest: Effort normal Neurologic: Patient is alert Skin: Skin is warm Psychiatric: Patient has normal mood and affect  Ortho Exam: Ortho exam demonstrates pretty normal gait alignment.  Has palpable intact nontender anterior to posterior tib and peroneal tendons bilaterally.  Does have a little bit of pain on that Achilles tendon attachment site.  Symmetric tibiotalar transverse tarsal and subtalar range of motion.  Pedal pulses intact bilaterally.  Left knee demonstrates trace effusion with medial joint line tenderness but stable collateral cruciate ligaments.  Range of motion is about 5 degrees to full flexion. This patient  is diagnosed with osteoarthritis of the knee(s).    Radiographs show evidence of joint space narrowing, osteophytes, subchondral sclerosis and/or subchondral cysts.  This patient has knee pain which interferes with functional and activities of daily living.    This patient has experienced inadequate response, adverse effects and/or intolerance with conservative treatments such as acetaminophen, NSAIDS, topical creams, physical therapy or regular exercise, knee bracing and/or weight loss.   This patient has experienced inadequate response or has a contraindication to intra articular steroid injections for at least 3 months.   This patient is not scheduled  to have a total knee replacement within 6 months of starting treatment with viscosupplementation.   Specialty Comments:  No specialty comments available.  Imaging: XR Knee 1-2 Views Left Result Date: 12/05/2023 AP lateral radiographs left knee reviewed.  Tricompartmental degenerative changes are present most severe in the medial compartment where there are bone-on-bone changes.  Slight varus alignment.  No acute fracture.  XR Os Calcis Right Result Date: 12/05/2023 AP lateral radiographs right calcaneus reviewed.  There is spurring both on the plantar and dorsal aspect of the calcaneus with no acute fracture.  Boehler's angle normal.  Mild degenerative changes in the hindfoot.    PMFS History: Patient Active Problem List   Diagnosis Date Noted   Malignant neoplasm of overlapping sites of left breast in female, estrogen receptor positive (HCC) 09/28/2018   Sciatica, right side 06/29/2018   Personal history of malignant neoplasm of breast 10/08/2013   Ovarian fibroma 04/14/2013   Benign neoplasm of colon 02/09/2013   Pelvic mass in female 01/27/2013   Osteopenia 01/12/2013   PMB (postmenopausal bleeding)    Atrophic vaginitis    Vitamin D deficiency 11/15/2008   OTHER ABNORMAL GLUCOSE 11/15/2008   HYPERLIPIDEMIA 08/17/2007   Carpal tunnel syndrome 07/03/2007   FEMORAL BRUIT 07/03/2007   Past Medical History:  Diagnosis Date   Allergy    seasonal allergies   Atrophic vaginitis    breast cancer 2014   LEFT   Breast lobular neoplasia    atypical   CHF (congestive heart failure) (HCC)    secondary to chemo-cleared up after chemo   Chronic kidney disease    hx of kidney stones   Fibrocystic breast changes    GERD (gastroesophageal reflux disease)    hx of   Heart murmur    grade 1  per patient -echo 7/14   Hyperlipidemia    on meds   Knee pain, bilateral    Major depression in full remission (HCC)    on meds   Microcalcifications of the breast    atypical ductal  hyperplasia with calcifications   Osteopenia    Dr Lily Peer   Osteopenia    PMB (postmenopausal bleeding)    Dr Lily Peer   Seasonal allergies    Sleep apnea    no cpap   Vitamin D deficiency    Wears glasses     Family History  Problem Relation Age of Onset   Alcohol abuse Mother    Alcohol abuse Father    Von Willebrand disease Father    Bone cancer Father        bone marrow cancer   Depression Son        Recovering alcoholic   Depression Maternal Aunt    Prostate cancer Maternal Uncle    Diabetes Paternal Uncle    Heart attack Paternal Uncle 73   Heart failure Paternal Grandmother        in 25s  GER disease Paternal Grandmother        esophageal stricture   Skin cancer Paternal Grandmother    GER disease Son    Colon cancer Neg Hx    Stomach cancer Neg Hx    Stroke Neg Hx    Colon polyps Neg Hx    Esophageal cancer Neg Hx    Rectal cancer Neg Hx     Past Surgical History:  Procedure Laterality Date   ABDOMINAL HYSTERECTOMY N/A 03/02/2013   Procedure: TOTAL HYSTERECTOMY ABDOMINAL;  Surgeon: Rejeana Brock A. Duard Brady, MD;  Location: [facility];  Service: Gynecology;  Laterality: N/A;   AXILLARY LYMPH NODE BIOPSY Left 04/08/2013   Procedure: AXILLARY LYMPH NODE BIOPSY and axillary lymph node disection;  Surgeon: Velora Heckler, MD;  Location: [facility];  Service: General;  Laterality: Left;  nuclear medicine injection    BREAST BIOPSY  01/09/2012   Procedure: BREAST BIOPSY WITH NEEDLE LOCALIZATION;  Surgeon: Velora Heckler, MD;  Location: [facility];  Service: General;  Laterality: Right;   CESAREAN SECTION  1976   COLONOSCOPY  1986, [id number]   polyps 2013 only, Dr. Russella Dar   COLONOSCOPY  2018   MS-MAC-hems/4 polyps/tics/SPP/adeq w/ lavage and suction   DILATION AND CURETTAGE OF UTERUS     ENDOMETRIAL BIOPSY  2002   Uterine  polyps, Dr. Eda Paschal   HYSTEROSCOPY     [facility] REMOVAL Right 01/02/2015   Procedure: REMOVAL PORT-A-CATH;  Surgeon: Darnell Level, MD;  Location: [facility];  Service: General;  Laterality: Right;   PORTACATH PLACEMENT Right 05/04/2013   Procedure: INSERTION PORT-A-CATH;  Surgeon: Velora Heckler, MD;  Location: [facility];  Service: General;  Laterality: Right;   SALPINGOOPHORECTOMY Bilateral 03/02/2013   Procedure: BILATERAL SALPINGO OOPHORECTOMY;  Surgeon: Rejeana Brock A. Duard Brady, MD;  Location: [facility];  Service: Gynecology;  Laterality: Bilateral;   TOTAL MASTECTOMY Bilateral 04/08/2013   Procedure: TOTAL MASTECTOMY;  Surgeon: Velora Heckler, MD;  Location: [facility];  Service: General;  Laterality: Bilateral;  needs bed for over night stay   WISDOM TOOTH EXTRACTION     Social History   Occupational History   Not on file  Tobacco Use   Smoking status: Never   Smokeless tobacco: Never   Tobacco comments:    second hand smoke for decades  Vaping Use   Vaping status: Never Used  Substance and Sexual Activity   Alcohol use: Yes    Alcohol/week: 4.0 standard drinks of alcohol    Types: 4 Standard drinks or equivalent per week    Comment: socially 1 - 2 glasses of wine per week   Drug use: No   Sexual activity: Never    Birth control/protection: Post-menopausal

## 2023-12-10 DIAGNOSIS — M7661 Achilles tendinitis, right leg: Secondary | ICD-10-CM | POA: Diagnosis not present

## 2023-12-16 ENCOUNTER — Other Ambulatory Visit: Payer: Self-pay

## 2023-12-16 DIAGNOSIS — M25562 Pain in left knee: Secondary | ICD-10-CM

## 2023-12-16 NOTE — Telephone Encounter (Signed)
 Called and left a VM for patient to Cb to schedule for gel injection with Dr. August Saucer

## 2023-12-17 ENCOUNTER — Ambulatory Visit: Admitting: Orthopaedic Surgery

## 2024-01-05 DIAGNOSIS — M1712 Unilateral primary osteoarthritis, left knee: Secondary | ICD-10-CM | POA: Diagnosis not present

## 2024-01-05 DIAGNOSIS — M25571 Pain in right ankle and joints of right foot: Secondary | ICD-10-CM | POA: Diagnosis not present

## 2024-01-13 DIAGNOSIS — M25571 Pain in right ankle and joints of right foot: Secondary | ICD-10-CM | POA: Diagnosis not present

## 2024-01-13 DIAGNOSIS — S86011S Strain of right Achilles tendon, sequela: Secondary | ICD-10-CM | POA: Diagnosis not present

## 2024-01-14 DIAGNOSIS — M25571 Pain in right ankle and joints of right foot: Secondary | ICD-10-CM | POA: Diagnosis not present

## 2024-01-14 DIAGNOSIS — S86011S Strain of right Achilles tendon, sequela: Secondary | ICD-10-CM | POA: Diagnosis not present

## 2024-01-19 DIAGNOSIS — M25571 Pain in right ankle and joints of right foot: Secondary | ICD-10-CM | POA: Diagnosis not present

## 2024-01-19 DIAGNOSIS — S83011S Lateral subluxation of right patella, sequela: Secondary | ICD-10-CM | POA: Diagnosis not present

## 2024-01-22 DIAGNOSIS — M25571 Pain in right ankle and joints of right foot: Secondary | ICD-10-CM | POA: Diagnosis not present

## 2024-01-22 DIAGNOSIS — S86011S Strain of right Achilles tendon, sequela: Secondary | ICD-10-CM | POA: Diagnosis not present

## 2024-01-26 DIAGNOSIS — S86011S Strain of right Achilles tendon, sequela: Secondary | ICD-10-CM | POA: Diagnosis not present

## 2024-01-26 DIAGNOSIS — M25571 Pain in right ankle and joints of right foot: Secondary | ICD-10-CM | POA: Diagnosis not present

## 2024-02-02 DIAGNOSIS — M25571 Pain in right ankle and joints of right foot: Secondary | ICD-10-CM | POA: Diagnosis not present

## 2024-02-02 DIAGNOSIS — S86011S Strain of right Achilles tendon, sequela: Secondary | ICD-10-CM | POA: Diagnosis not present

## 2024-02-04 DIAGNOSIS — S86011S Strain of right Achilles tendon, sequela: Secondary | ICD-10-CM | POA: Diagnosis not present

## 2024-02-04 DIAGNOSIS — M25571 Pain in right ankle and joints of right foot: Secondary | ICD-10-CM | POA: Diagnosis not present

## 2024-02-05 DIAGNOSIS — M7661 Achilles tendinitis, right leg: Secondary | ICD-10-CM | POA: Diagnosis not present

## 2024-02-19 DIAGNOSIS — M8589 Other specified disorders of bone density and structure, multiple sites: Secondary | ICD-10-CM | POA: Diagnosis not present

## 2024-02-25 DIAGNOSIS — M1712 Unilateral primary osteoarthritis, left knee: Secondary | ICD-10-CM | POA: Diagnosis not present

## 2024-03-11 DIAGNOSIS — M25562 Pain in left knee: Secondary | ICD-10-CM | POA: Diagnosis not present

## 2024-03-11 DIAGNOSIS — M7661 Achilles tendinitis, right leg: Secondary | ICD-10-CM | POA: Diagnosis not present

## 2024-03-23 DIAGNOSIS — F3342 Major depressive disorder, recurrent, in full remission: Secondary | ICD-10-CM | POA: Diagnosis not present

## 2024-03-23 DIAGNOSIS — R7301 Impaired fasting glucose: Secondary | ICD-10-CM | POA: Diagnosis not present

## 2024-03-23 DIAGNOSIS — E785 Hyperlipidemia, unspecified: Secondary | ICD-10-CM | POA: Diagnosis not present

## 2024-03-23 DIAGNOSIS — E669 Obesity, unspecified: Secondary | ICD-10-CM | POA: Diagnosis not present

## 2024-03-23 DIAGNOSIS — G4733 Obstructive sleep apnea (adult) (pediatric): Secondary | ICD-10-CM | POA: Diagnosis not present

## 2024-03-23 DIAGNOSIS — K219 Gastro-esophageal reflux disease without esophagitis: Secondary | ICD-10-CM | POA: Diagnosis not present

## 2024-04-19 DIAGNOSIS — M25662 Stiffness of left knee, not elsewhere classified: Secondary | ICD-10-CM | POA: Diagnosis not present

## 2024-04-19 DIAGNOSIS — R262 Difficulty in walking, not elsewhere classified: Secondary | ICD-10-CM | POA: Diagnosis not present

## 2024-04-19 DIAGNOSIS — M1732 Unilateral post-traumatic osteoarthritis, left knee: Secondary | ICD-10-CM | POA: Diagnosis not present

## 2024-04-20 DIAGNOSIS — M1711 Unilateral primary osteoarthritis, right knee: Secondary | ICD-10-CM | POA: Diagnosis not present

## 2024-04-27 DIAGNOSIS — M25662 Stiffness of left knee, not elsewhere classified: Secondary | ICD-10-CM | POA: Diagnosis not present

## 2024-04-27 DIAGNOSIS — M1732 Unilateral post-traumatic osteoarthritis, left knee: Secondary | ICD-10-CM | POA: Diagnosis not present

## 2024-04-27 DIAGNOSIS — R262 Difficulty in walking, not elsewhere classified: Secondary | ICD-10-CM | POA: Diagnosis not present

## 2024-05-04 DIAGNOSIS — M25662 Stiffness of left knee, not elsewhere classified: Secondary | ICD-10-CM | POA: Diagnosis not present

## 2024-05-04 DIAGNOSIS — M1732 Unilateral post-traumatic osteoarthritis, left knee: Secondary | ICD-10-CM | POA: Diagnosis not present

## 2024-05-04 DIAGNOSIS — R262 Difficulty in walking, not elsewhere classified: Secondary | ICD-10-CM | POA: Diagnosis not present

## 2024-05-10 ENCOUNTER — Other Ambulatory Visit: Payer: Self-pay | Admitting: Orthopedic Surgery

## 2024-05-10 NOTE — Progress Notes (Signed)
Pt. Needs orders for surgery. 

## 2024-05-10 NOTE — Patient Instructions (Signed)
 SURGICAL WAITING ROOM VISITATION Patients having surgery or a procedure may have no more than 2 support people in the waiting area - these visitors may rotate in the visitor waiting room.   Due to an increase in RSV and influenza rates and associated hospitalizations, children ages 41 and under may not visit patients in [facility] hospitals. If the patient needs to stay at the hospital during part of their recovery, the visitor guidelines for inpatient rooms apply.  PRE-OP VISITATION  Pre-op nurse will coordinate an appropriate time for 1 support person to accompany the patient in pre-op.  This support person may not rotate.  This visitor will be contacted when the time is appropriate for the visitor to come back in the pre-op area.  Please refer to the [facility] website for the visitor guidelines for Inpatients (after your surgery is over and you are in a regular room).  You are not required to quarantine at this time prior to your surgery. However, you must do this: Hand Hygiene often Do NOT share personal items Notify your provider if you are in close contact with someone who has COVID or you develop fever 100.4 or greater, new onset of sneezing, cough, sore throat, shortness of breath or body aches.  If you test positive for Covid or have been in contact with anyone that has tested positive in the last 10 days please notify you surgeon.    Your procedure is scheduled on: 05/24/24   Report to [facility] Main Entrance: [place] entrance where the [employer] is available.   Report to admitting at:7:45 AM  Call this number if you have any questions or problems the morning of surgery [phone]  FOLLOW ANY ADDITIONAL PRE OP INSTRUCTIONS YOU RECEIVED FROM YOUR SURGEON'S OFFICE!!!  Do not eat food after Midnight the night prior to your surgery/procedure.  After Midnight you may have the following liquids until: 7:00 AM DAY OF SURGERY  Clear Liquid Diet Water Black Coffee  (sugar ok, NO MILK/CREAM OR CREAMERS)  Tea (sugar ok, NO MILK/CREAM OR CREAMERS) regular and decaf                             Plain Jell-O  with no fruit (NO RED)                                           Fruit ices (not with fruit pulp, NO RED)                                     Popsicles (NO RED)                                                                  Juice: NO CITRUS JUICES: only apple, WHITE grape, WHITE cranberry Sports drinks like Gatorade or Powerade (NO RED)   The day of surgery:  Drink ONE (1) Pre-Surgery Clear Ensure at : 7:00 AM the morning of surgery. Drink in one sitting. Do not sip.  This drink was given to  you during your hospital pre-op appointment visit. Nothing else to drink after completing the Pre-Surgery Clear Ensure or G2 : No candy, chewing gum or throat lozenges.     Oral Hygiene is also important to reduce your risk of infection.        Remember - BRUSH YOUR TEETH THE MORNING OF SURGERY WITH YOUR REGULAR TOOTHPASTE  Do NOT smoke after Midnight the night before surgery.  STOP TAKING all Vitamins, Herbs and supplements 1 week before your surgery.   Take ONLY these medicines the morning of surgery with A SIP OF WATER: bupropion,duloxetine,anastrozole .  If You have been diagnosed with Sleep Apnea - Bring CPAP mask and tubing day of surgery. We will provide you with a CPAP machine on the day of your surgery.                   You may not have any metal on your body including hair pins, jewelry, and body piercing  Do not wear make-up, lotions, powders, perfumes / cologne, or deodorant  Do not wear nail polish including gel and S&S, artificial / acrylic nails, or any other type of covering on natural nails including finger and toenails. If you have artificial nails, gel coating, etc., that needs to be removed by a nail salon, Please have this removed prior to surgery. Not doing so may mean that your surgery could be cancelled or delayed if the Surgeon or  anesthesia staff feels like they are unable to monitor you safely.   Do not shave 48 hours prior to surgery to avoid nicks in your skin which may contribute to postoperative infections.   Contacts, Hearing Aids, dentures or bridgework may not be worn into surgery. DENTURES WILL BE REMOVED PRIOR TO SURGERY PLEASE DO NOT APPLY Poly grip OR ADHESIVES!!!  You may bring a small overnight bag with you on the day of surgery, only pack items that are not valuable. [facility] IS NOT RESPONSIBLE   FOR VALUABLES THAT ARE LOST OR STOLEN.   Patients discharged on the day of surgery will not be allowed to drive home.  Someone NEEDS to stay with you for the first 24 hours after anesthesia.  Do not bring your home medications to the hospital. The Pharmacy will dispense medications listed on your medication list to you during your admission in the Hospital.  Special Instructions: Bring a copy of your healthcare power of attorney and living will documents the day of surgery, if you wish to have them scanned into your [facility]- EPIC  Please read over the following fact sheets you were given: IF YOU HAVE QUESTIONS ABOUT YOUR PRE-OP INSTRUCTIONS, PLEASE CALL [phone]  PATIENT SIGNATURE_________________________________  NURSE SIGNATURE__________________________________  ________________________________________________________________________    Pre-operative 5 CHG Bath Instructions   You can play a key role in reducing the risk of infection after surgery. Your skin needs to be as free of germs as possible. You can reduce the number of germs on your skin by washing with CHG (chlorhexidine gluconate) soap before surgery. CHG is an antiseptic soap that kills germs and continues to kill germs even after washing.   DO NOT use if you have an allergy to chlorhexidine/CHG or antibacterial soaps. If your skin becomes reddened or irritated, stop using the CHG and notify one of our RNs at  [phone].   Please shower with the CHG soap starting 4 days before surgery using the following schedule:     Please keep in mind the following:  DO NOT shave, including legs and underarms, starting the day of your first shower.   You may shave your face at any point before/day of surgery.  Place clean sheets on your bed the day you start using CHG soap. Use a clean washcloth (not used since being washed) for each shower. DO NOT sleep with pets once you start using the CHG.   CHG Shower Instructions:  If you choose to wash your hair and private area, wash first with your normal shampoo/soap.  After you use shampoo/soap, rinse your hair and body thoroughly to remove shampoo/soap residue.  Turn the water OFF and apply about 3 tablespoons (45 ml) of CHG soap to a CLEAN washcloth.  Apply CHG soap ONLY FROM YOUR NECK DOWN TO YOUR TOES (washing for 3-5 minutes)  DO NOT use CHG soap on face, private areas, open wounds, or sores.  Pay special attention to the area where your surgery is being performed.  If you are having back surgery, having someone wash your back for you may be helpful. Wait 2 minutes after CHG soap is applied, then you may rinse off the CHG soap.  Pat dry with a clean towel  Put on clean clothes/pajamas   If you choose to wear lotion, please use ONLY the CHG-compatible lotions on the back of this paper.     Additional instructions for the day of surgery: DO NOT APPLY any lotions, deodorants, cologne, or perfumes.   Put on clean/comfortable clothes.  Brush your teeth.  Ask your nurse before applying any prescription medications to the skin.   CHG Compatible Lotions   Aveeno Moisturizing lotion  Cetaphil Moisturizing Cream  Cetaphil Moisturizing Lotion  Clairol Herbal Essence Moisturizing Lotion, Dry Skin  Clairol Herbal Essence Moisturizing Lotion, Extra Dry Skin  Clairol Herbal Essence Moisturizing Lotion, Normal Skin  Curel Age Defying Therapeutic Moisturizing  Lotion with Alpha Hydroxy  Curel Extreme Care Body Lotion  Curel Soothing Hands Moisturizing Hand Lotion  Curel Therapeutic Moisturizing Cream, Fragrance-Free  Curel Therapeutic Moisturizing Lotion, Fragrance-Free  Curel Therapeutic Moisturizing Lotion, Original Formula  Eucerin Daily Replenishing Lotion  Eucerin Dry Skin Therapy Plus Alpha Hydroxy Crme  Eucerin Dry Skin Therapy Plus Alpha Hydroxy Lotion  Eucerin Original Crme  Eucerin Original Lotion  Eucerin Plus Crme Eucerin Plus Lotion  Eucerin TriLipid Replenishing Lotion  Keri Anti-Bacterial Hand Lotion  Keri Deep Conditioning Original Lotion Dry Skin Formula Softly Scented  Keri Deep Conditioning Original Lotion, Fragrance Free Sensitive Skin Formula  Keri Lotion Fast Absorbing Fragrance Free Sensitive Skin Formula  Keri Lotion Fast Absorbing Softly Scented Dry Skin Formula  Keri Original Lotion  Keri Skin Renewal Lotion Keri Silky Smooth Lotion  Keri Silky Smooth Sensitive Skin Lotion  Nivea Body Creamy Conditioning Oil  Nivea Body Extra Enriched Lotion  Nivea Body Original Lotion  Nivea Body Sheer Moisturizing Lotion Nivea Crme  Nivea Skin Firming Lotion  NutraDerm 30 Skin Lotion  NutraDerm Skin Lotion  NutraDerm Therapeutic Skin Cream  NutraDerm Therapeutic Skin Lotion  ProShield Protective Hand Cream  Provon moisturizing lotion  Incentive Spirometer  An incentive spirometer is a tool that can help keep your lungs clear and active. This tool measures how well you are filling your lungs with each breath. Taking long deep breaths may help reverse or decrease the chance of developing breathing (pulmonary) problems (especially infection) following: A long period of time when you are unable to move or be active. BEFORE THE PROCEDURE  If the spirometer includes an indicator to  show your best effort, your nurse or respiratory therapist will set it to a desired goal. If possible, sit up straight or lean slightly forward.  Try not to slouch. Hold the incentive spirometer in an upright position. INSTRUCTIONS FOR USE  Sit on the edge of your bed if possible, or sit up as far as you can in bed or on a chair. Hold the incentive spirometer in an upright position. Breathe out normally. Place the mouthpiece in your mouth and seal your lips tightly around it. Breathe in slowly and as deeply as possible, raising the piston or the ball toward the top of the column. Hold your breath for 3-5 seconds or for as long as possible. Allow the piston or ball to fall to the bottom of the column. Remove the mouthpiece from your mouth and breathe out normally. Rest for a few seconds and repeat Steps 1 through 7 at least 10 times every 1-2 hours when you are awake. Take your time and take a few normal breaths between deep breaths. The spirometer may include an indicator to show your best effort. Use the indicator as a goal to work toward during each repetition. After each set of 10 deep breaths, practice coughing to be sure your lungs are clear. If you have an incision (the cut made at the time of surgery), support your incision when coughing by placing a pillow or rolled up towels firmly against it. Once you are able to get out of bed, walk around indoors and cough well. You may stop using the incentive spirometer when instructed by your caregiver.  RISKS AND COMPLICATIONS Take your time so you do not get dizzy or light-headed. If you are in pain, you may need to take or ask for pain medication before doing incentive spirometry. It is harder to take a deep breath if you are having pain. AFTER USE Rest and breathe slowly and easily. It can be helpful to keep track of a log of your progress. Your caregiver can provide you with a simple table to help with this. If you are using the spirometer at home, follow these instructions: SEEK MEDICAL CARE IF:  You are having difficultly using the spirometer. You have trouble using the spirometer  as often as instructed. Your pain medication is not giving enough relief while using the spirometer. You develop fever of 100.5 F (38.1 C) or higher. SEEK IMMEDIATE MEDICAL CARE IF:  You cough up bloody sputum that had not been present before. You develop fever of 102 F (38.9 C) or greater. You develop worsening pain at or near the incision site. MAKE SURE YOU:  Understand these instructions. Will watch your condition. Will get help right away if you are not doing well or get worse. Document Released: 01/13/2007 Document Revised: 11/25/2011 Document Reviewed: 03/16/2007 [facility] Patient Information 2014 [place], [place].   ________________________________________________________________________

## 2024-05-11 ENCOUNTER — Encounter (HOSPITAL_COMMUNITY): Payer: Self-pay

## 2024-05-11 ENCOUNTER — Other Ambulatory Visit: Payer: Self-pay

## 2024-05-11 ENCOUNTER — Encounter (HOSPITAL_COMMUNITY)
Admission: RE | Admit: 2024-05-11 | Discharge: 2024-05-11 | Disposition: A | Discharge status: Home / Self Care | Source: Ambulatory Visit | Attending: Orthopedic Surgery | Admitting: Orthopedic Surgery

## 2024-05-11 ENCOUNTER — Ambulatory Visit (HOSPITAL_COMMUNITY)
Admission: RE | Admit: 2024-05-11 | Discharge: 2024-05-11 | Disposition: A | Discharge status: Home / Self Care | Source: Ambulatory Visit | Attending: Orthopedic Surgery | Admitting: Orthopedic Surgery

## 2024-05-11 VITALS — BP 129/71 | HR 94 | Temp 98.0°F | Ht 64.0 in | Wt 176.0 lb

## 2024-05-11 DIAGNOSIS — I2510 Atherosclerotic heart disease of native coronary artery without angina pectoris: Secondary | ICD-10-CM | POA: Diagnosis not present

## 2024-05-11 DIAGNOSIS — R262 Difficulty in walking, not elsewhere classified: Secondary | ICD-10-CM | POA: Diagnosis not present

## 2024-05-11 DIAGNOSIS — Z01818 Encounter for other preprocedural examination: Secondary | ICD-10-CM

## 2024-05-11 DIAGNOSIS — M1711 Unilateral primary osteoarthritis, right knee: Secondary | ICD-10-CM | POA: Diagnosis not present

## 2024-05-11 DIAGNOSIS — M25662 Stiffness of left knee, not elsewhere classified: Secondary | ICD-10-CM | POA: Diagnosis not present

## 2024-05-11 DIAGNOSIS — M1732 Unilateral post-traumatic osteoarthritis, left knee: Secondary | ICD-10-CM | POA: Diagnosis not present

## 2024-05-11 HISTORY — DX: Personal history of urinary calculi: Z87.442

## 2024-05-11 LAB — BASIC METABOLIC PANEL WITH GFR
Anion gap: 14 (ref 5–15)
BUN: 20 mg/dL (ref 8–23)
CO2: 22 mmol/L (ref 22–32)
Calcium: 9.5 mg/dL (ref 8.9–10.3)
Chloride: 105 mmol/L (ref 98–111)
Creatinine, Ser: 0.77 mg/dL (ref 0.44–1.00)
GFR, Estimated: >60 mL/min (ref >60)
Glucose, Bld: 134 mg/dL — ABNORMAL HIGH (ref 70–99)
Potassium: 4.2 mmol/L (ref 3.5–5.1)
Sodium: 141 mmol/L (ref 135–145)

## 2024-05-11 LAB — CBC
HCT: 41.8 % (ref 36.0–46.0)
Hemoglobin: 13.9 g/dL (ref 12.0–15.0)
MCH: 30.6 pg (ref 26.0–34.0)
MCHC: 33.3 g/dL (ref 30.0–36.0)
MCV: 92.1 fL (ref 80.0–100.0)
Platelets: 198 K/uL (ref 150–400)
RBC: 4.54 MIL/uL (ref 3.87–5.11)
RDW: 12.3 % (ref 11.5–15.5)
WBC: 7.8 K/uL (ref 4.0–10.5)
nRBC: 0 % (ref 0.0–0.2)

## 2024-05-11 LAB — SURGICAL PCR SCREEN
MRSA, PCR: NEGATIVE
Staphylococcus aureus: POSITIVE — AB

## 2024-05-11 NOTE — Progress Notes (Addendum)
 For Anesthesia: PCP - Loreli Elsie JONETTA Mickey., MD  Cardiologist - N/A  Bowel Prep reminder:  Chest x-Krol - CT cardiac: 06/29/19 EKG - 05/11/24 Stress Test -  ECHO - 11/15/14 Cardiac Cath -  Pacemaker/ICD device last checked: Pacemaker orders received: Device Rep notified:  Spinal Cord Stimulator:N/A  Sleep Study - Yes CPAP - NO  Fasting Blood Sugar - N/A Checks Blood Sugar _____ times a day Date and result of last Hgb A1c-  Last dose of GLP1 agonist- N/A GLP1 instructions:   Last dose of SGLT-2 inhibitors- N/A SGLT-2 instructions:   Blood Thinner Instructions:N/A Aspirin Instructions: Last Dose:  Activity level: Can go up a flight of stairs and activities of daily living without stopping and without chest pain and/or shortness of breath   Able to exercise without chest pain and/or shortness of breath  Anesthesia review:Hx:CHF (as per pt. This happened during the time she was getting chemo,she has not further issues with it after),CKD,Murmur,OSA(NO CPAP)   Patient denies shortness of breath, fever, cough and chest pain at PAT appointment   Patient verbalized understanding of instructions that were reviewed over the telephone.

## 2024-05-12 ENCOUNTER — Encounter (HOSPITAL_COMMUNITY): Payer: Self-pay

## 2024-05-12 NOTE — Anesthesia Preprocedure Evaluation (Addendum)
 Anesthesia Evaluation  Patient identified by MRN, date of birth, ID band Patient awake    Reviewed: Allergy & Precautions, NPO status , Patient's Chart, lab work & pertinent test results  History of Anesthesia Complications Negative for: history of anesthetic complications  Airway Mallampati: III  TM Distance: >3 FB Neck ROM: Full    Dental  (+) Dental Advisory Given   Pulmonary neg shortness of breath, sleep apnea (noncompliant with CPAP) , neg COPD, neg recent URI   Pulmonary exam normal breath sounds clear to auscultation       Cardiovascular (-) hypertension(-) angina +CHF  (-) Past MI, (-) Cardiac Stents and (-) CABG  Rhythm:Regular Rate:Normal  HLD  CAC of 0 on 06/29/2019  TTE 11/15/2014: SUMMARY  The left ventricular size is normal.  There is normal left ventricular wall thickness.   Left ventricular systolic function is normal.  The left ventricular wall motion is normal.  The right ventricle is normal size.  The right ventricular systolic function is normal.  The left atrial size is normal.   Right atrial size is normal.  There is aortic valve sclerosis.  There is mild tricuspid regurgitation.  Trace to mild pulmonic valvular regurgitation.  There is no pericardial effusion.     Neuro/Psych neg Seizures PSYCHIATRIC DISORDERS  Depression       GI/Hepatic Neg liver ROS,GERD  ,,  Endo/Other  negative endocrine ROS    Renal/GU negative Renal ROS     Musculoskeletal  (+) Arthritis , Osteoarthritis,  Osteopenia    Abdominal   Peds  Hematology negative hematology ROS (+) Lab Results      Component                Value               Date                      WBC                      7.8                 05/11/2024                HGB                      13.9                05/11/2024                HCT                      41.8                05/11/2024                MCV                      92.1                 05/11/2024                PLT                      198                 05/11/2024  Anesthesia Other Findings Last Zepbound: 2 weeks ago  Reproductive/Obstetrics H/o breast cancer                              Anesthesia Physical Anesthesia Plan  ASA: 2  Anesthesia Plan: MAC and Spinal   Post-op Pain Management: Regional block* and Tylenol  PO (pre-op)*   Induction: Intravenous  PONV Risk Score and Plan: 2 and Ondansetron , Dexamethasone  and Treatment may vary due to age or medical condition  Airway Management Planned: Natural Airway and Simple Face Mask  Additional Equipment:   Intra-op Plan:   Post-operative Plan:   Informed Consent: I have reviewed the patients History and Physical, chart, labs and discussed the procedure including the risks, benefits and alternatives for the proposed anesthesia with the patient or authorized representative who has indicated his/her understanding and acceptance.       Plan Discussed with:   Anesthesia Plan Comments: (Reviewed. PMH of HFpEF, OSA, GERD, arthritis. Labs WNL. EKG not done in pre op, obtain DOS  I have discussed risks of neuraxial anesthesia including but not limited to infection, bleeding, nerve injury, back pain, headache, seizures, and failure of block. Patient denies bleeding disorders and is not currently anticoagulated. Labs have been reviewed. Risks and benefits discussed. All patient's questions answered.    )         Anesthesia Quick Evaluation

## 2024-05-12 NOTE — Progress Notes (Signed)
 PCR: + STAPH.

## 2024-05-18 NOTE — Care Plan (Signed)
 Ortho Bundle Case Management Note  Patient Details  Name: [name] MRN: [id number] Date of Birth: 1946/02/01   met with patient in the office for H&P. will discharge to home with family to assist. has Rolling walker. OPPT set up with [facility] discharge instructions discussed and quesitons answered. Patient and MD in agreement with plan. Choice offered.                DME Arranged:    DME Agency:     HH Arranged:    HH Agency:     Additional Comments: Please contact me with any questions of if this plan should need to change.  Charlies Pitch,  RN,BSN,MHA,CCM  [facility]  [phone] 05/18/2024, 12:24 PM

## 2024-05-23 DIAGNOSIS — M1711 Unilateral primary osteoarthritis, right knee: Principal | ICD-10-CM | POA: Diagnosis present

## 2024-05-23 NOTE — H&P (Signed)
 TOTAL KNEE ADMISSION H&P  Patient is being admitted for right total knee arthroplasty.  Subjective:  Chief Complaint:right knee pain.  HPI: [name], 78 y.o. female, has a history of pain and functional disability in the right knee due to arthritis and has failed non-surgical conservative treatments for greater than 12 weeks to includeNSAID's and/or analgesics, corticosteriod injections, flexibility and strengthening excercises, weight reduction as appropriate, and activity modification.  Onset of symptoms was gradual, starting 2 years ago with gradually worsening course since that time. The patient noted no past surgery on the right knee(s).  Patient currently rates pain in the right knee(s) at 7 out of 10 with activity. Patient has night pain, worsening of pain with activity and weight bearing, pain that interferes with activities of daily living, pain with passive range of motion, and crepitus.  Patient has evidence of subchondral sclerosis, periarticular osteophytes, and joint space narrowing by imaging studies. . There is no active infection.  Patient Active Problem List   Diagnosis Date Noted   Primary osteoarthritis of right knee 05/23/2024   Malignant neoplasm of overlapping sites of left breast in female, estrogen receptor positive (HCC) 09/28/2018   Sciatica, right side 06/29/2018   Personal history of malignant neoplasm of breast 10/08/2013   Ovarian fibroma 04/14/2013   Benign neoplasm of colon 02/09/2013   Pelvic mass in female 01/27/2013   Osteopenia 01/12/2013   PMB (postmenopausal bleeding)    Atrophic vaginitis    Vitamin D  deficiency 11/15/2008   OTHER ABNORMAL GLUCOSE 11/15/2008   HYPERLIPIDEMIA 08/17/2007   Carpal tunnel syndrome 07/03/2007   FEMORAL BRUIT 07/03/2007   Past Medical History:  Diagnosis Date   Allergy    seasonal allergies   Arthritis    Atrophic vaginitis    breast cancer 2014   LEFT   Breast lobular neoplasia    atypical   CHF  (congestive heart failure) (HCC)    secondary to chemo-cleared up after chemo   Fibrocystic breast changes    GERD (gastroesophageal reflux disease)    hx of   History of kidney stones    Hyperlipidemia    on meds   Knee pain, bilateral    Major depression in full remission (HCC)    on meds   Microcalcifications of the breast    atypical ductal hyperplasia with calcifications   Osteopenia    Dr Winfred   PMB (postmenopausal bleeding)    Dr Winfred   Seasonal allergies    Sleep apnea    no cpap   Vitamin D  deficiency    Wears glasses     Past Surgical History:  Procedure Laterality Date   ABDOMINAL HYSTERECTOMY N/A 03/02/2013   Procedure: TOTAL HYSTERECTOMY ABDOMINAL;  Surgeon: Elenore A. Dodie, MD;  Location: [facility];  Service: Gynecology;  Laterality: N/A;   AXILLARY LYMPH NODE BIOPSY Left 04/08/2013   Procedure: AXILLARY LYMPH NODE BIOPSY and axillary lymph node disection;  Surgeon: Krystal CHRISTELLA Spinner, MD;  Location: [facility];  Service: General;  Laterality: Left;  nuclear medicine injection    BREAST BIOPSY  01/09/2012   Procedure: BREAST BIOPSY WITH NEEDLE LOCALIZATION;  Surgeon: Krystal CHRISTELLA Spinner, MD;  Location: [facility];  Service: General;  Laterality: Right;   CATARACT EXTRACTION, BILATERAL     CESAREAN SECTION  1976   COLONOSCOPY  1986, [id number]   polyps 2013 only, Dr. Aneita   COLONOSCOPY  2018   MS-MAC-hems/4 polyps/tics/SPP/adeq w/ lavage and suction   DILATION AND  CURETTAGE OF UTERUS     ENDOMETRIAL BIOPSY  2002   Uterine  polyps, Dr. Norval   HYSTEROSCOPY     [facility] REMOVAL Right 01/02/2015   Procedure: REMOVAL PORT-A-CATH;  Surgeon: Krystal Spinner, MD;  Location: [facility];  Service: General;  Laterality: Right;   PORTACATH PLACEMENT Right 05/04/2013   Procedure: INSERTION PORT-A-CATH;  Surgeon: Krystal CHRISTELLA Spinner, MD;  Location: [facility];  Service: General;  Laterality: Right;   SALPINGOOPHORECTOMY  Bilateral 03/02/2013   Procedure: BILATERAL SALPINGO OOPHORECTOMY;  Surgeon: Elenore A. Dodie, MD;  Location: [facility];  Service: Gynecology;  Laterality: Bilateral;   TOTAL MASTECTOMY Bilateral 04/08/2013   Procedure: TOTAL MASTECTOMY;  Surgeon: Krystal CHRISTELLA Spinner, MD;  Location: [facility];  Service: General;  Laterality: Bilateral;  needs bed for over night stay   WISDOM TOOTH EXTRACTION      Current Facility-Administered Medications  Medication Dose Route Frequency Provider Last Rate Last Admin   0.9 %  sodium chloride  infusion  500 mL Intravenous Continuous Aneita Gwendlyn DASEN, MD       Current Outpatient Medications  Medication Sig Dispense Refill Last Dose/Taking   anastrozole  (ARIMIDEX ) 1 MG tablet TAKE 1 TABLET(1 MG) BY MOUTH DAILY (Patient not taking: Reported on 05/11/2024) 30 tablet 3    buPROPion  (WELLBUTRIN  XL) 150 MG 24 hr tablet Take 150 mg by mouth daily.  11    cholecalciferol (VITAMIN D ) 1000 UNITS tablet Take 2,000 Units by mouth daily.       cycloSPORINE  (RESTASIS ) 0.05 % ophthalmic emulsion Place 1 drop into both eyes 2 (two) times daily.      DULoxetine  (CYMBALTA ) 60 MG capsule Take 60 mg by mouth daily.      estradiol (ESTRACE) 0.1 MG/GM vaginal cream Place 1 Applicatorful vaginally 3 (three) times a week.      loratadine  (CLARITIN ) 10 MG tablet Take 10 mg by mouth at bedtime.      meloxicam  (MOBIC ) 15 MG tablet Take 15 mg by mouth daily. (Patient not taking: Reported on 05/11/2024)      nitroGLYCERIN  (NITRODUR - DOSED IN MG/24 HR) 0.2 mg/hr patch Place 0.2 mg onto the skin daily.      OMEGA-3 FATTY ACIDS PO Take by mouth.      Probiotic Product (ALIGN PO) Take by mouth.      ZEPBOUND 7.5 MG/0.5ML Pen Inject 7.5 mg into the skin once a week.      Allergies  Allergen Reactions   Codeine Rash    Post C section   Oxycodone  Itching    Social History   Tobacco Use   Smoking status: Never   Smokeless tobacco: Never   Tobacco comments:    second hand smoke for  decades  Substance Use Topics   Alcohol  use: Yes    Alcohol /week: 4.0 standard drinks of alcohol     Types: 4 Standard drinks or equivalent per week    Comment: socially 1 - 2 glasses of wine per week    Family History  Problem Relation Age of Onset   Alcohol  abuse Mother    Alcohol  abuse Father    Von Willebrand disease Father    Bone cancer Father        bone marrow cancer   Depression Son        Recovering alcoholic   Depression Maternal Aunt    Prostate cancer Maternal Uncle    Diabetes Paternal Uncle    Heart attack Paternal Uncle 83  Heart failure Paternal Grandmother        in 90s   GER disease Paternal Grandmother        esophageal stricture   Skin cancer Paternal Grandmother    GER disease Son    Colon cancer Neg Hx    Stomach cancer Neg Hx    Stroke Neg Hx    Colon polyps Neg Hx    Esophageal cancer Neg Hx    Rectal cancer Neg Hx      Review of Systems NEGATIVE AS IT RELATES TO THE hpi. Objective:  Physical Exam Well-developed well-nourished patient in no acute distress. Alert and oriented x3 HEENT:within normal limits Cardiac: Regular rate and rhythm Pulmonary: Lungs clear to auscultation Abdomen: Soft and nontender.  Normal active bowel sounds  Musculoskeletal: Right knee exam: She has medial joint line tenderness palpation.  Crepitation through range of motion.  Pain and grinding through motion.  No instability. Vital signs in last 24 hours:    Labs:   Estimated body mass index is 30.21 kg/m as calculated from the following:   Height as of 05/11/24: 5' 4 (1.626 m).   Weight as of 05/11/24: 79.8 kg.   Imaging Review Plain radiographs demonstrate severe degenerative joint disease of the right knee(s). The overall alignment isneutral. The bone quality appears to be good for age and reported activity level.      Assessment/Plan:  End stage arthritis, right knee   The patient history, physical examination, clinical judgment of the provider  and imaging studies are consistent with end stage degenerative joint disease of the right knee(s) and total knee arthroplasty is deemed medically necessary. The treatment options including medical management, injection therapy arthroscopy and arthroplasty were discussed at length. The risks and benefits of total knee arthroplasty were presented and reviewed. The risks due to aseptic loosening, infection, stiffness, patella tracking problems, thromboembolic complications and other imponderables were discussed. The patient acknowledged the explanation, agreed to proceed with the plan and consent was signed. Patient is being admitted for inpatient treatment for surgery, pain control, PT, OT, prophylactic antibiotics, VTE prophylaxis, progressive ambulation and ADL's and discharge planning. The patient is planning to be discharged AS AN OUTPATIENT IF DOING WELL POSTOP.  sHE IS SCHEDULED FOR [facility]PATIENT [facility]YS[facility]APY [facility] 05/[facility]/24.     Patient's anticipated LOS is less than 2 midnights, meeting these requirements:  - Lives within 1 hour of care - Has a competent adult at home to recover with post-op recover - NO history of  - Chronic pain requiring opiods  - Diabetes  - Coronary Artery Disease  - Heart failure  - Heart attack  - Stroke  - DVT/VTE  - Cardiac arrhythmia  - Respiratory Failure/COPD  - Renal failure  - Anemia  - Advanced Liver disease

## 2024-05-24 ENCOUNTER — Encounter (HOSPITAL_COMMUNITY): Payer: Self-pay | Admitting: Orthopedic Surgery

## 2024-05-24 ENCOUNTER — Observation Stay (HOSPITAL_COMMUNITY)
Admission: RE | Admit: 2024-05-24 | Discharge: 2024-05-25 | Disposition: A | Discharge status: Home / Self Care | Source: Ambulatory Visit | Attending: Orthopedic Surgery | Admitting: Orthopedic Surgery

## 2024-05-24 ENCOUNTER — Other Ambulatory Visit: Payer: Self-pay

## 2024-05-24 ENCOUNTER — Ambulatory Visit (HOSPITAL_BASED_OUTPATIENT_CLINIC_OR_DEPARTMENT_OTHER): Payer: Self-pay | Admitting: Anesthesiology

## 2024-05-24 ENCOUNTER — Ambulatory Visit (HOSPITAL_COMMUNITY): Payer: Self-pay | Admitting: Medical

## 2024-05-24 ENCOUNTER — Encounter (HOSPITAL_COMMUNITY)
Admission: RE | Disposition: A | Discharge status: Home / Self Care | Payer: Self-pay | Source: Ambulatory Visit | Attending: Orthopedic Surgery

## 2024-05-24 DIAGNOSIS — Z96651 Presence of right artificial knee joint: Secondary | ICD-10-CM | POA: Diagnosis not present

## 2024-05-24 DIAGNOSIS — M1711 Unilateral primary osteoarthritis, right knee: Secondary | ICD-10-CM | POA: Diagnosis not present

## 2024-05-24 DIAGNOSIS — Z7982 Long term (current) use of aspirin: Secondary | ICD-10-CM | POA: Insufficient documentation

## 2024-05-24 DIAGNOSIS — F1090 Alcohol use, unspecified, uncomplicated: Secondary | ICD-10-CM | POA: Diagnosis not present

## 2024-05-24 DIAGNOSIS — G8918 Other acute postprocedural pain: Secondary | ICD-10-CM | POA: Diagnosis not present

## 2024-05-24 DIAGNOSIS — I509 Heart failure, unspecified: Secondary | ICD-10-CM | POA: Insufficient documentation

## 2024-05-24 DIAGNOSIS — E559 Vitamin D deficiency, unspecified: Secondary | ICD-10-CM

## 2024-05-24 DIAGNOSIS — Z860101 Personal history of adenomatous and serrated colon polyps: Secondary | ICD-10-CM

## 2024-05-24 HISTORY — PX: TOTAL KNEE ARTHROPLASTY: SHX125

## 2024-05-24 SURGERY — ARTHROPLASTY, KNEE, TOTAL
Anesthesia: Monitor Anesthesia Care | Site: Knee | Laterality: Right

## 2024-05-24 MED ORDER — ONDANSETRON HCL 4 MG/2ML IJ SOLN: 4.0000 mg | Freq: Four times a day (QID) | INTRAMUSCULAR | Status: DC | PRN

## 2024-05-24 MED ORDER — KCL IN DEXTROSE-NACL 20-5-0.45 MEQ/L-%-% IV SOLN
INTRAVENOUS | Status: DC
  Filled 2024-05-24 (×2): qty 1000

## 2024-05-24 MED ORDER — LACTATED RINGERS IV BOLUS
500.0000 mL | Freq: Once | INTRAVENOUS | Status: AC
  Administered 2024-05-24: 500 mL via INTRAVENOUS

## 2024-05-24 MED ORDER — ROPIVACAINE HCL 5 MG/ML IJ SOLN
INTRAMUSCULAR | Status: DC | PRN
Start: 2024-05-24 — End: 2024-05-24
  Administered 2024-05-24: 20 mL via PERINEURAL

## 2024-05-24 MED ORDER — LIDOCAINE HCL (PF) 2 % IJ SOLN
INTRAMUSCULAR | Status: AC
  Filled 2024-05-24: qty 5

## 2024-05-24 MED ORDER — ONDANSETRON HCL 4 MG/2ML IJ SOLN
INTRAMUSCULAR | Status: DC | PRN
  Administered 2024-05-24: 4 mg via INTRAVENOUS

## 2024-05-24 MED ORDER — BUPIVACAINE IN DEXTROSE 0.75-8.25 % IT SOLN
INTRATHECAL | Status: DC | PRN
  Administered 2024-05-24: 1.8 mg via INTRATHECAL

## 2024-05-24 MED ORDER — HYDROMORPHONE HCL 2 MG PO TABS
ORAL_TABLET | ORAL | Status: AC
  Filled 2024-05-24: qty 1

## 2024-05-24 MED ORDER — DULOXETINE HCL 60 MG PO CPEP
60.0000 mg | ORAL_CAPSULE | Freq: Every day | ORAL | Status: DC
  Administered 2024-05-25: 60 mg via ORAL
  Filled 2024-05-24: qty 1

## 2024-05-24 MED ORDER — METOCLOPRAMIDE HCL 5 MG/ML IJ SOLN: 5.0000 mg | Freq: Three times a day (TID) | INTRAMUSCULAR | Status: DC | PRN

## 2024-05-24 MED ORDER — ASPIRIN 325 MG PO TBEC
325.0000 mg | DELAYED_RELEASE_TABLET | Freq: Every day | ORAL | Status: DC
  Administered 2024-05-25: 325 mg via ORAL
  Filled 2024-05-24: qty 1

## 2024-05-24 MED ORDER — ONDANSETRON HCL 4 MG/2ML IJ SOLN
INTRAMUSCULAR | Status: AC
  Filled 2024-05-24: qty 2

## 2024-05-24 MED ORDER — PANTOPRAZOLE SODIUM 40 MG PO TBEC
40.0000 mg | DELAYED_RELEASE_TABLET | Freq: Every day | ORAL | Status: DC
  Administered 2024-05-25: 40 mg via ORAL
  Filled 2024-05-24: qty 1

## 2024-05-24 MED ORDER — SODIUM CHLORIDE 0.9 % IV SOLN: 500.0000 mL | INTRAVENOUS | Status: DC

## 2024-05-24 MED ORDER — CHLORHEXIDINE GLUCONATE 0.12 % MT SOLN
15.0000 mL | Freq: Once | OROMUCOSAL | Status: AC
Start: 2024-05-24 — End: 2024-05-24
  Administered 2024-05-24: 15 mL via OROMUCOSAL

## 2024-05-24 MED ORDER — DOCUSATE SODIUM 100 MG PO CAPS: 100.0000 mg | ORAL_CAPSULE | Freq: Two times a day (BID) | ORAL | 2 refills | Status: DC

## 2024-05-24 MED ORDER — POVIDONE-IODINE 10 % EX SWAB: 2.0000 | Freq: Once | CUTANEOUS | Status: DC

## 2024-05-24 MED ORDER — BUPIVACAINE LIPOSOME 1.3 % IJ SUSP
INTRAMUSCULAR | Status: AC
  Filled 2024-05-24: qty 20

## 2024-05-24 MED ORDER — TRANEXAMIC ACID-NACL 1000-0.7 MG/100ML-% IV SOLN
1000.0000 mg | INTRAVENOUS | Status: AC
  Administered 2024-05-24: 1000 mg via INTRAVENOUS
  Filled 2024-05-24: qty 100

## 2024-05-24 MED ORDER — PHENOL 1.4 % MT LIQD: 1.0000 | OROMUCOSAL | Status: DC | PRN

## 2024-05-24 MED ORDER — CEFAZOLIN SODIUM-DEXTROSE 2-4 GM/100ML-% IV SOLN
2.0000 g | INTRAVENOUS | Status: AC
  Administered 2024-05-24: 2 g via INTRAVENOUS
  Filled 2024-05-24: qty 100

## 2024-05-24 MED ORDER — ALUM & MAG HYDROXIDE-SIMETH 200-200-20 MG/5ML PO SUSP: 30.0000 mL | ORAL | Status: DC | PRN

## 2024-05-24 MED ORDER — BUPIVACAINE-EPINEPHRINE (PF) 0.5% -1:200000 IJ SOLN
INTRAMUSCULAR | Status: AC
  Filled 2024-05-24: qty 30

## 2024-05-24 MED ORDER — WATER FOR IRRIGATION, STERILE IR SOLN
Status: DC | PRN
  Administered 2024-05-24: 2000 mL

## 2024-05-24 MED ORDER — ASPIRIN 325 MG PO TBEC: 325.0000 mg | DELAYED_RELEASE_TABLET | Freq: Two times a day (BID) | ORAL | 0 refills | Status: DC

## 2024-05-24 MED ORDER — ONDANSETRON HCL 4 MG PO TABS: 4.0000 mg | ORAL_TABLET | Freq: Four times a day (QID) | ORAL | Status: DC | PRN

## 2024-05-24 MED ORDER — PHENYLEPHRINE HCL-NACL 20-0.9 MG/250ML-% IV SOLN
INTRAVENOUS | Status: DC | PRN
  Administered 2024-05-24: 20 ug/min via INTRAVENOUS

## 2024-05-24 MED ORDER — LORATADINE 10 MG PO TABS: 10.0000 mg | ORAL_TABLET | Freq: Every day | ORAL | Status: DC

## 2024-05-24 MED ORDER — TRAMADOL HCL 50 MG PO TABS
ORAL_TABLET | ORAL | Status: AC
  Filled 2024-05-24: qty 1

## 2024-05-24 MED ORDER — DIPHENHYDRAMINE HCL 12.5 MG/5ML PO ELIX: 12.5000 mg | ORAL_SOLUTION | ORAL | Status: DC | PRN

## 2024-05-24 MED ORDER — TRANEXAMIC ACID-NACL 1000-0.7 MG/100ML-% IV SOLN
1000.0000 mg | Freq: Once | INTRAVENOUS | Status: AC
  Administered 2024-05-24: 1000 mg via INTRAVENOUS
  Filled 2024-05-24: qty 100

## 2024-05-24 MED ORDER — DEXAMETHASONE SODIUM PHOSPHATE 10 MG/ML IJ SOLN
INTRAMUSCULAR | Status: AC
  Filled 2024-05-24: qty 1

## 2024-05-24 MED ORDER — HYDROMORPHONE HCL 1 MG/ML IJ SOLN: 0.5000 mg | INTRAMUSCULAR | Status: DC | PRN

## 2024-05-24 MED ORDER — FENTANYL CITRATE PF 50 MCG/ML IJ SOSY
50.0000 ug | PREFILLED_SYRINGE | INTRAMUSCULAR | Status: DC
  Administered 2024-05-24: 50 ug via INTRAVENOUS
  Filled 2024-05-24: qty 2

## 2024-05-24 MED ORDER — FENTANYL CITRATE PF 50 MCG/ML IJ SOSY: 25.0000 ug | PREFILLED_SYRINGE | INTRAMUSCULAR | Status: DC | PRN

## 2024-05-24 MED ORDER — BUPROPION HCL ER (XL) 150 MG PO TB24
150.0000 mg | ORAL_TABLET | Freq: Every day | ORAL | Status: DC
  Administered 2024-05-25: 150 mg via ORAL
  Filled 2024-05-24: qty 1

## 2024-05-24 MED ORDER — BUPIVACAINE LIPOSOME 1.3 % IJ SUSP: 20.0000 mL | Freq: Once | INTRAMUSCULAR | Status: DC

## 2024-05-24 MED ORDER — BISACODYL 5 MG PO TBEC: 5.0000 mg | DELAYED_RELEASE_TABLET | Freq: Every day | ORAL | Status: DC | PRN

## 2024-05-24 MED ORDER — ACETAMINOPHEN 500 MG PO TABS
ORAL_TABLET | ORAL | Status: AC
  Filled 2024-05-24: qty 2

## 2024-05-24 MED ORDER — DEXAMETHASONE SODIUM PHOSPHATE 10 MG/ML IJ SOLN
INTRAMUSCULAR | Status: DC | PRN
  Administered 2024-05-24: 10 mg via INTRAVENOUS

## 2024-05-24 MED ORDER — PROPOFOL 10 MG/ML IV BOLUS
INTRAVENOUS | Status: AC
  Filled 2024-05-24: qty 20

## 2024-05-24 MED ORDER — CYCLOSPORINE 0.05 % OP EMUL
1.0000 [drp] | Freq: Two times a day (BID) | OPHTHALMIC | Status: DC
  Administered 2024-05-25: 1 [drp] via OPHTHALMIC
  Filled 2024-05-24 (×2): qty 30

## 2024-05-24 MED ORDER — PROPOFOL 10 MG/ML IV BOLUS
INTRAVENOUS | Status: DC | PRN
  Administered 2024-05-24: 10 mg via INTRAVENOUS
  Administered 2024-05-24: 20 mg via INTRAVENOUS
  Administered 2024-05-24: 125 ug/kg/min via INTRAVENOUS

## 2024-05-24 MED ORDER — ACETAMINOPHEN 500 MG PO TABS
1000.0000 mg | ORAL_TABLET | Freq: Four times a day (QID) | ORAL | Status: AC
  Administered 2024-05-24 – 2024-05-25 (×4): 1000 mg via ORAL
  Filled 2024-05-24 (×2): qty 2

## 2024-05-24 MED ORDER — ZOLPIDEM TARTRATE 5 MG PO TABS: 5.0000 mg | ORAL_TABLET | Freq: Every evening | ORAL | Status: DC | PRN

## 2024-05-24 MED ORDER — SODIUM CHLORIDE (PF) 0.9 % IJ SOLN
INTRAMUSCULAR | Status: AC
  Filled 2024-05-24: qty 50

## 2024-05-24 MED ORDER — PROPOFOL 1000 MG/100ML IV EMUL
INTRAVENOUS | Status: AC
  Filled 2024-05-24: qty 100

## 2024-05-24 MED ORDER — LACTATED RINGERS IV BOLUS
250.0000 mL | Freq: Once | INTRAVENOUS | Status: AC
  Administered 2024-05-24: 250 mL via INTRAVENOUS

## 2024-05-24 MED ORDER — FENTANYL CITRATE (PF) 100 MCG/2ML IJ SOLN
INTRAMUSCULAR | Status: AC
  Filled 2024-05-24: qty 2

## 2024-05-24 MED ORDER — HYDROMORPHONE HCL 2 MG PO TABS
1.0000 mg | ORAL_TABLET | ORAL | Status: DC | PRN
  Administered 2024-05-24: 2 mg via ORAL

## 2024-05-24 MED ORDER — HYDROMORPHONE HCL 2 MG PO TABS: 2.0000 mg | ORAL_TABLET | ORAL | 0 refills | Status: DC | PRN

## 2024-05-24 MED ORDER — BUPIVACAINE-EPINEPHRINE (PF) 0.5% -1:200000 IJ SOLN
INTRAMUSCULAR | Status: DC | PRN
  Administered 2024-05-24: 100 mL

## 2024-05-24 MED ORDER — TIZANIDINE HCL 2 MG PO TABS: 2.0000 mg | ORAL_TABLET | Freq: Four times a day (QID) | ORAL | 0 refills | Status: DC | PRN

## 2024-05-24 MED ORDER — POLYETHYLENE GLYCOL 3350 17 G PO PACK: 17.0000 g | PACK | Freq: Every day | ORAL | Status: DC | PRN

## 2024-05-24 MED ORDER — LACTATED RINGERS IV SOLN: INTRAVENOUS | Status: DC

## 2024-05-24 MED ORDER — LACTATED RINGERS IV BOLUS: 250.0000 mL | Freq: Once | INTRAVENOUS | Status: DC

## 2024-05-24 MED ORDER — TRAMADOL HCL 50 MG PO TABS
50.0000 mg | ORAL_TABLET | Freq: Four times a day (QID) | ORAL | Status: DC
  Administered 2024-05-24 – 2024-05-25 (×4): 50 mg via ORAL
  Filled 2024-05-24 (×3): qty 1

## 2024-05-24 MED ORDER — MENTHOL 3 MG MT LOZG: 1.0000 | LOZENGE | OROMUCOSAL | Status: DC | PRN

## 2024-05-24 MED ORDER — SODIUM CHLORIDE 0.9 % IR SOLN
Status: DC | PRN
  Administered 2024-05-24: 1000 mL

## 2024-05-24 MED ORDER — 0.9 % SODIUM CHLORIDE (POUR BTL) OPTIME
TOPICAL | Status: DC | PRN
  Administered 2024-05-24: 1000 mL

## 2024-05-24 MED ORDER — FENTANYL CITRATE (PF) 100 MCG/2ML IJ SOLN
INTRAMUSCULAR | Status: DC | PRN
  Administered 2024-05-24: 50 ug via INTRAVENOUS

## 2024-05-24 MED ORDER — ACETAMINOPHEN 325 MG PO TABS: 325.0000 mg | ORAL_TABLET | Freq: Four times a day (QID) | ORAL | Status: DC | PRN

## 2024-05-24 MED ORDER — ORAL CARE MOUTH RINSE: 15.0000 mL | Freq: Once | OROMUCOSAL | Status: AC

## 2024-05-24 MED ORDER — FLEET ENEMA RE ENEM: 1.0000 | ENEMA | Freq: Once | RECTAL | Status: DC | PRN

## 2024-05-24 MED ORDER — DOCUSATE SODIUM 100 MG PO CAPS
100.0000 mg | ORAL_CAPSULE | Freq: Two times a day (BID) | ORAL | Status: DC
  Administered 2024-05-24 – 2024-05-25 (×2): 100 mg via ORAL
  Filled 2024-05-24 (×2): qty 1

## 2024-05-24 MED ORDER — AMISULPRIDE (ANTIEMETIC) 5 MG/2ML IV SOLN: 10.0000 mg | Freq: Once | INTRAVENOUS | Status: DC | PRN

## 2024-05-24 MED ORDER — NITROGLYCERIN 0.2 MG/HR TD PT24: 0.2000 mg | MEDICATED_PATCH | Freq: Every day | TRANSDERMAL | Status: DC

## 2024-05-24 MED ORDER — ACETAMINOPHEN 500 MG PO TABS
1000.0000 mg | ORAL_TABLET | Freq: Once | ORAL | Status: AC
  Administered 2024-05-24: 1000 mg via ORAL
  Filled 2024-05-24: qty 2

## 2024-05-24 MED ORDER — METOCLOPRAMIDE HCL 5 MG PO TABS: 5.0000 mg | ORAL_TABLET | Freq: Three times a day (TID) | ORAL | Status: DC | PRN

## 2024-05-24 SURGICAL SUPPLY — 46 items
ATTUNE MED DOME PAT 38 KNEE (Knees) IMPLANT
ATTUNE PSFEM RTSZ6 NARCEM KNEE (Femur) IMPLANT
ATTUNE PSRP INSR SZ6 6 KNEE (Insert) IMPLANT
BAG COUNTER SPONGE SURGICOUNT (BAG) IMPLANT
BAG ZIPLOCK 12X15 (MISCELLANEOUS) ×2 IMPLANT
BASE TIBIAL ROT PLAT SZ 5 KNEE (Knees) IMPLANT
BLADE SAGITTAL 25.0X1.19X90 (BLADE) ×2 IMPLANT
BLADE SAW SGTL 13.0X1.19X90.0M (BLADE) ×2 IMPLANT
BNDG ELASTIC 4X5.8 VLCR NS LF (GAUZE/BANDAGES/DRESSINGS) IMPLANT
BNDG ELASTIC 6INX 5YD STR LF (GAUZE/BANDAGES/DRESSINGS) ×2 IMPLANT
BOOTIES KNEE HIGH SLOAN (MISCELLANEOUS) ×2 IMPLANT
BOWL SMART MIX CTS (DISPOSABLE) ×2 IMPLANT
CEMENT HV SMART SET (Cement) ×4 IMPLANT
COVER SURGICAL LIGHT HANDLE (MISCELLANEOUS) ×2 IMPLANT
CUFF TRNQT CYL 34X4.125X (TOURNIQUET CUFF) ×2 IMPLANT
DERMABOND ADVANCED .7 DNX12 (GAUZE/BANDAGES/DRESSINGS) ×2 IMPLANT
DRAPE U-SHAPE 47X51 STRL (DRAPES) ×2 IMPLANT
DRESSING AQUACEL AG SP 3.5X10 (GAUZE/BANDAGES/DRESSINGS) IMPLANT
DRSG AQUACEL AG ADV 3.5X10 (GAUZE/BANDAGES/DRESSINGS) ×2 IMPLANT
DURAPREP 26ML APPLICATOR (WOUND CARE) ×2 IMPLANT
ELECT PENCIL ROCKER SW 15FT (MISCELLANEOUS) ×2 IMPLANT
ELECT REM PT RETURN 15FT ADLT (MISCELLANEOUS) ×2 IMPLANT
GLOVE BIOGEL PI IND STRL 8 (GLOVE) ×4 IMPLANT
GLOVE ECLIPSE 7.5 STRL STRAW (GLOVE) ×4 IMPLANT
GOWN STRL REUS W/ TWL XL LVL3 (GOWN DISPOSABLE) ×4 IMPLANT
HOOD PEEL AWAY T7 (MISCELLANEOUS) ×6 IMPLANT
KIT TURNOVER KIT A (KITS) ×2 IMPLANT
MANIFOLD NEPTUNE II (INSTRUMENTS) ×2 IMPLANT
NDL HYPO 22X1.5 SAFETY MO (MISCELLANEOUS) ×4 IMPLANT
NEEDLE HYPO 22X1.5 SAFETY MO (MISCELLANEOUS) ×2 IMPLANT
NS IRRIG 1000ML POUR BTL (IV SOLUTION) ×2 IMPLANT
PACK TOTAL KNEE CUSTOM (KITS) ×2 IMPLANT
PADDING CAST COTTON 6X4 STRL (CAST SUPPLIES) ×2 IMPLANT
PIN FIX SIGMA LCS THRD HI (PIN) IMPLANT
PROTECTOR NERVE ULNAR (MISCELLANEOUS) ×2 IMPLANT
SET HNDPC FAN SPRY TIP SCT (DISPOSABLE) ×2 IMPLANT
SPIKE FLUID TRANSFER (MISCELLANEOUS) ×4 IMPLANT
SUT MNCRL AB 3-0 PS2 18 (SUTURE) ×2 IMPLANT
SUT VIC AB 0 CT1 36 (SUTURE) ×4 IMPLANT
SUT VIC AB 1 CT1 36 (SUTURE) ×4 IMPLANT
SUT VIC AB 2-0 CT1 TAPERPNT 27 (SUTURE) ×2 IMPLANT
SYR CONTROL 10ML LL (SYRINGE) ×4 IMPLANT
TRAY CATH INTERMITTENT SS 16FR (CATHETERS) IMPLANT
TUBE SUCTION HIGH CAP CLEAR NV (SUCTIONS) ×2 IMPLANT
WATER STERILE IRR 1000ML POUR (IV SOLUTION) ×4 IMPLANT
WRAP KNEE MAXI GEL POST OP (GAUZE/BANDAGES/DRESSINGS) ×2 IMPLANT

## 2024-05-24 NOTE — Anesthesia Procedure Notes (Signed)
 Spinal  Patient location during procedure: OR Start time: 05/24/2024 9:13 AM End time: 05/24/2024 9:16 AM Staffing Performed: resident/CRNA  Resident/CRNA: Franchot Delon RAMAN, CRNA Performed by: Franchot Delon RAMAN, CRNA Authorized by: Peggye Delon Brunswick, MD   Preanesthetic Checklist Completed: patient identified, IV checked, site marked, risks and benefits discussed, surgical consent, monitors and equipment checked, pre-op evaluation and timeout performed Spinal Block Patient position: sitting Prep: DuraPrep Patient monitoring: heart rate, cardiac monitor, continuous pulse ox and blood pressure Approach: midline Location: L4-5 Injection technique: single-shot Needle Needle type: Pencan  Needle gauge: 25 G Assessment Sensory level: T4 Events: CSF return and second provider

## 2024-05-24 NOTE — Progress Notes (Signed)
 Orthopedic Tech Progress Note Patient Details:  [name] 1946/03/30 [id number] Applied bone foam per order.  Ortho Devices Type of Ortho Device: Bone foam zero knee Ortho Device/Splint Location: RLE Ortho Device/Splint Interventions: Ordered, Application, Adjustment   Post Interventions Patient Tolerated: Well Instructions Provided: Adjustment of device, Care of device, Poper ambulation with device  Morna Pink 05/24/2024, 12:13 PM

## 2024-05-24 NOTE — Evaluation (Signed)
 Physical Therapy Evaluation Patient Details Name: [name] MRN: [id number] DOB: Mar 12, 1946 Today's Date: 05/24/2024  History of Present Illness  78 yo female presents to therapy following R TKA on 05/24/2024 due to failure of conservative measures. Pt PMH includes but is not limited to: L ba ca s/p B mastectomy, HLD, carpal tunnel syndrome, OA, and OSA,.  Clinical Impression    TALENA NEIRA is a 78 y.o. female POD 0 s/p R TKA. Patient reports IND with mobility at baseline. Patient is now limited by functional impairments (see PT problem list below) and requires CGA for bed mobility and and varying assist for transfers due to R LE instability attributed to slow regression of anesthesia. Patient was unable to safely ambulate at time of eval.  Patient instructed in exercise to facilitate ROM and circulation to manage edema. Patient will benefit from continued skilled PT interventions to address impairments and progress towards PLOF. Acute PT will follow to progress mobility and stair training in preparation for safe discharge home with family support and OPPT services.       If plan is discharge home, recommend the following: Two people to help with walking and/or transfers;A little help with bathing/dressing/bathroom;Assistance with cooking/housework;Assist for transportation;Help with stairs or ramp for entrance   Can travel by private vehicle        Equipment Recommendations None recommended by PT  Recommendations for Other Services       Functional Status Assessment Patient has had a recent decline in their functional status and demonstrates the ability to make significant improvements in function in a reasonable and predictable amount of time.     Precautions / Restrictions Precautions Precautions: Fall;Knee Restrictions Weight Bearing Restrictions Per Provider Order: No      Mobility  Bed Mobility Overal bed mobility: Needs Assistance Bed Mobility: Supine to Sit      Supine to sit: Contact guard, HOB elevated, Used rails     General bed mobility comments: min ceus    Transfers Overall transfer level: Needs assistance Equipment used: Rolling walker (2 wheels) Transfers: Sit to/from Stand, Bed to chair/wheelchair/BSC Sit to Stand: Contact guard assist, Mod assist, Min assist Stand pivot transfers: Min assist         General transfer comment: PT noted pt having difficulty with SAQ when in supine prior to bed mobility tasks indicating possible decreased quad motor control and coordination-- pt able to stand with CGA from elevated EOB with B UE support at RW however when weight shifing to to the R quad unable to engage and pt required mod A for balance and to return to EOB, PT elected to set up for SPT bed to recliner with min A and cues and pt self limting R LE WB with heavy reliance on B UE support to offload R LE due to instabiltiy attributed to slow regression of anesthesia.    Ambulation/Gait               General Gait Details: NT  Stairs            Wheelchair Mobility     Tilt Bed    Modified Rankin (Stroke Patients Only)       Balance Overall balance assessment: Needs assistance Sitting-balance support: Feet supported Sitting balance-Leahy Scale: Good     Standing balance support: Bilateral upper extremity supported, During functional activity, Reliant on assistive device for balance Standing balance-Leahy Scale: Poor  Pertinent Vitals/Pain Pain Assessment Pain Assessment: 0-10 Pain Score: 1  Pain Location: R knee and LE Pain Descriptors / Indicators: Dull, Aching, Operative site guarding Pain Intervention(s): Limited activity within patient's tolerance, Monitored during session, Premedicated before session, Repositioned, Ice applied    Home Living Family/patient expects to be discharged to:: Private residence Living Arrangements: Alone Available Help at Discharge:  Family Type of Home: House Home Access: Stairs to enter Entrance Stairs-Rails: Left Entrance Stairs-Number of Steps: 4   Home Layout: Two level;Able to live on main level with bedroom/bathroom Home Equipment: Rolling Walker (2 wheels);Cane - single point Additional Comments: sunken family room with one step, pt reports R achiles tendonitis impacing gait pattern and step navigation prior to surgery    Prior Function Prior Level of Function : Independent/Modified Independent;Driving             Mobility Comments: IND no AD for all ADLs, self care tasks and IADLs       Extremity/Trunk Assessment        Lower Extremity Assessment Lower Extremity Assessment: RLE deficits/detail RLE Deficits / Details: ankle DF 5/5, PF 4/5 : SLR < 10 degree lag RLE Sensation: decreased light touch    Cervical / Trunk Assessment Cervical / Trunk Assessment: Normal  Communication   Communication Communication: No apparent difficulties    Cognition Arousal: Alert Behavior During Therapy: WFL for tasks assessed/performed   PT - Cognitive impairments: No apparent impairments                         Following commands: Intact       Cueing       General Comments      Exercises Total Joint Exercises Ankle Circles/Pumps: AROM, Both, 10 reps Quad Sets: AROM, Right, 5 reps Short Arc Quad: Right, 5 reps, AAROM Heel Slides: AROM, Right, 5 reps Hip ABduction/ADduction: AROM, Right, 5 reps Straight Leg Raises: AROM, Right, 5 reps   Assessment/Plan    PT Assessment Patient needs continued PT services  PT Problem List Decreased strength;Decreased range of motion;Decreased activity tolerance;Decreased mobility;Decreased balance;Decreased coordination;Pain       PT Treatment Interventions DME instruction;Gait training;Stair training;Functional mobility training;Therapeutic activities;Therapeutic exercise;Balance training;Neuromuscular re-education;Patient/family  education;Modalities    PT Goals (Current goals can be found in the Care Plan section)  Acute Rehab PT Goals Patient Stated Goal: have the next one done and get back to normal, walk without pain be able to walk my dog PT Goal Formulation: With patient Time For Goal Achievement: 06/07/24 Potential to Achieve Goals: Good    Frequency 7X/week     Co-evaluation               AM-PAC PT 6 Clicks Mobility  Outcome Measure Help needed turning from your back to your side while in a flat bed without using bedrails?: None Help needed moving from lying on your back to sitting on the side of a flat bed without using bedrails?: A Little Help needed moving to and from a bed to a chair (including a wheelchair)?: A Little Help needed standing up from a chair using your arms (e.g., wheelchair or bedside chair)?: A Lot Help needed to walk in hospital room?: Total Help needed climbing 3-5 steps with a railing? : Total 6 Click Score: 14    End of Session Equipment Utilized During Treatment: Gait belt Activity Tolerance: Treatment limited secondary to medical complications (Comment) (R LE instabiltiy) Patient left: in chair;with call bell/phone within  reach;with family/visitor present Nurse Communication: Mobility status;Other (comment) (pt is not safe at this time for same day d/c, PT to return later in the day and re-assess pt safety and readiness for d/chome) PT Visit Diagnosis: Unsteadiness on feet (R26.81);Other abnormalities of gait and mobility (R26.89);Muscle weakness (generalized) (M62.81);Difficulty in walking, not elsewhere classified (R26.2);Pain Pain - Right/Left: Right Pain - part of body: Knee;Leg    Time: 1410-1442 PT Time Calculation (min) (ACUTE ONLY): 32 min   Charges:   PT Evaluation $PT Eval Low Complexity: 1 Low PT Treatments $Therapeutic Activity: 8-22 mins PT General Charges $$ ACUTE PT VISIT: 1 Visit         [place], PT Acute Rehab   [place] VEAR Drone 05/24/2024, 3:02 PM

## 2024-05-24 NOTE — Plan of Care (Signed)
   Problem: Education: Goal: Knowledge of General Education information will improve Description Including pain rating scale, medication(s)/side effects and non-pharmacologic comfort measures Outcome: Progressing

## 2024-05-24 NOTE — Anesthesia Procedure Notes (Addendum)
 Procedure Name: MAC Date/Time: 05/24/2024 9:09 AM  Performed by: Franchot Delon RAMAN, CRNAPre-anesthesia Checklist: Patient identified, Emergency Drugs available, Suction available and Patient being monitored Oxygen Delivery Method: Simple face mask Ventilation: Nasal airway inserted- appropriate to patient size Placement Confirmation: positive ETCO2 Dental Injury: Teeth and Oropharynx as per pre-operative assessment  Comments: 7.0 NPA R nare.

## 2024-05-24 NOTE — Transfer of Care (Signed)
 Immediate Anesthesia Transfer of Care Note  Patient: [name]  Procedure(s) Performed: ARTHROPLASTY, KNEE, TOTAL (Right: Knee)  Patient Location: PACU  Anesthesia Type:Spinal  Level of Consciousness: awake, alert , oriented, and patient cooperative  Airway & Oxygen Therapy: Patient Spontanous Breathing and Patient connected to face mask oxygen  Post-op Assessment: Report given to RN and Post -op Vital signs reviewed and stable  Post vital signs: Reviewed and stable  Last Vitals:  Vitals Value Taken Time  BP 124/76 05/24/24 11:24  Temp 36.4 C 05/24/24 11:24  Pulse 68 05/24/24 11:28  Resp 12 05/24/24 11:28  SpO2 100 % 05/24/24 11:28  Vitals shown include unfiled device data.  Last Pain:  Vitals:   05/24/24 0649  TempSrc:   PainSc: 0-No pain         Complications: No notable events documented.

## 2024-05-24 NOTE — Progress Notes (Signed)
 Physical Therapy Treatment Patient Details Name: [name] MRN: [id number] DOB: 21-Aug-1946 Today's Date: 05/24/2024   History of Present Illness 78 yo female presents to therapy following R TKA on 05/24/2024 due to failure of conservative measures. Pt PMH includes but is not limited to: L ba ca s/p B mastectomy, HLD, carpal tunnel syndrome, OA, and OSA,.    PT Comments   [name] is a 78 y.o. female POD 0 s/p R TKA. Patient reports IND with mobility at baseline. Patient is now limited by functional impairments (see PT problem list below) and requires S for bed mobility and CGA for transfers. Patient was unable to safely ambulate due to poor R LE motor control and coordination with R knee soft with weight acceptance. Pt unsafe at this time for same day d/c home, pt and son in agreement. Pt to transition to 3rd floor for observation and continued therapy. Patient will benefit from continued skilled PT interventions to address impairments and progress towards PLOF. Acute PT will follow to progress mobility and stair training in preparation for safe discharge home with family support and OPPT services.    If plan is discharge home, recommend the following: Two people to help with walking and/or transfers;A little help with bathing/dressing/bathroom;Assistance with cooking/housework;Assist for transportation;Help with stairs or ramp for entrance   Can travel by private vehicle        Equipment Recommendations  None recommended by PT    Recommendations for Other Services       Precautions / Restrictions Precautions Precautions: Fall;Knee Restrictions Weight Bearing Restrictions Per Provider Order: No     Mobility  Bed Mobility Overal bed mobility: Needs Assistance Bed Mobility: Sit to Supine     Supine to sit: Contact guard, HOB elevated, Used rails Sit to supine: Supervision   General bed mobility comments: min cues, pt seated in recliner when PT arrived and pt transfered to  standard hospital bed at ed of tx session    Transfers Overall transfer level: Needs assistance Equipment used: Rolling walker (2 wheels) Transfers: Sit to/from Stand, Bed to chair/wheelchair/BSC Sit to Stand: Contact guard assist, Mod assist, Min assist Stand pivot transfers: Contact guard assist         General transfer comment: PT noted pt having difficulty with SAQ when PT returned later in pm and  when in standing pt exhibited poor R knee motor control and coordination with weight acceptance instability. CGA for sit to stand from recliner with cues for proper UE placement and CGA with RW for SPT recliner to hospital bed    Ambulation/Gait               General Gait Details: NT   Stairs             Wheelchair Mobility     Tilt Bed    Modified Rankin (Stroke Patients Only)       Balance Overall balance assessment: Needs assistance Sitting-balance support: Feet supported Sitting balance-Leahy Scale: Good     Standing balance support: Bilateral upper extremity supported, During functional activity, Reliant on assistive device for balance Standing balance-Leahy Scale: Poor                              Communication Communication Communication: No apparent difficulties  Cognition Arousal: Alert Behavior During Therapy: WFL for tasks assessed/performed   PT - Cognitive impairments: No apparent impairments  Following commands: Intact      Cueing    Exercises Total Joint Exercises Ankle Circles/Pumps: AROM, Both, 10 reps Quad Sets: AROM, Right, 5 reps Short Arc Quad: Right, 5 reps, AAROM Heel Slides: AROM, Right, 5 reps Hip ABduction/ADduction: AROM, Right, 5 reps Straight Leg Raises: AROM, Right, 5 reps    General Comments        Pertinent Vitals/Pain Pain Assessment Pain Assessment: 0-10 Pain Score: 2  Pain Location: R knee and LE Pain Descriptors / Indicators: Dull, Aching, Operative site  guarding Pain Intervention(s): Limited activity within patient's tolerance, Monitored during session, Premedicated before session, Repositioned, Ice applied    Home Living Family/patient expects to be discharged to:: Private residence Living Arrangements: Alone Available Help at Discharge: Family Type of Home: House Home Access: Stairs to enter Entrance Stairs-Rails: Left Entrance Stairs-Number of Steps: 4   Home Layout: Two level;Able to live on main level with bedroom/bathroom Home Equipment: Rolling Walker (2 wheels);Cane - single point Additional Comments: sunken family room with one step, pt reports R achiles tendonitis impacing gait pattern and step navigation prior to surgery    Prior Function            PT Goals (current goals can now be found in the care plan section) Acute Rehab PT Goals Patient Stated Goal: have the next one done and get back to normal, walk without pain be able to walk my dog PT Goal Formulation: With patient Time For Goal Achievement: 06/07/24 Potential to Achieve Goals: Good Progress towards PT goals: Progressing toward goals (slow due to R knee instabilty)    Frequency    7X/week      PT Plan      Co-evaluation              AM-PAC PT 6 Clicks Mobility   Outcome Measure  Help needed turning from your back to your side while in a flat bed without using bedrails?: None Help needed moving from lying on your back to sitting on the side of a flat bed without using bedrails?: A Little Help needed moving to and from a bed to a chair (including a wheelchair)?: A Little Help needed standing up from a chair using your arms (e.g., wheelchair or bedside chair)?: A Lot Help needed to walk in hospital room?: Total Help needed climbing 3-5 steps with a railing? : Total 6 Click Score: 14    End of Session Equipment Utilized During Treatment: Gait belt Activity Tolerance: Treatment limited secondary to medical complications (Comment) (R LE  instabiltiy) Patient left: with call bell/phone within reach;with family/visitor present;in bed;with nursing/sitter in room Nurse Communication: Mobility status;Other (comment) (pt is not safe at this time for same day d/c) PT Visit Diagnosis: Unsteadiness on feet (R26.81);Other abnormalities of gait and mobility (R26.89);Muscle weakness (generalized) (M62.81);Difficulty in walking, not elsewhere classified (R26.2);Pain Pain - Right/Left: Right Pain - part of body: Knee;Leg     Time: 8371-8357 PT Time Calculation (min) (ACUTE ONLY): 14 min  Charges:    $Therapeutic Activity: 8-22 mins PT General Charges $$ ACUTE PT VISIT: 1 Visit                     [name], PT Acute Rehab    [name] VEAR Drone 05/24/2024, 4:53 PM

## 2024-05-24 NOTE — Addendum Note (Signed)
 Addendum  created 05/24/24 1301 by Peggye Delon Brunswick, MD   Attestation recorded in Intraprocedure, Intraprocedure Attestations filed

## 2024-05-24 NOTE — OR Nursing (Signed)
 IN AND OUT CATH PERFORMED AT END OF SURGERY CLEAR YELLOW URINE OUT

## 2024-05-24 NOTE — Anesthesia Procedure Notes (Signed)
 Anesthesia Regional Block: Adductor canal block   Pre-Anesthetic Checklist: , timeout performed,  Correct Patient, Correct Site, Correct Laterality,  Correct Procedure, Correct Position, site marked,  Risks and benefits discussed,  Surgical consent,  Pre-op evaluation,  At surgeon's request and post-op pain management  Laterality: Right  Prep: chloraprep       Needles:  Injection technique: Single-shot  Needle Type: Echogenic Stimulator Needle     Needle Length: 9cm  Needle Gauge: 21     Additional Needles:   Procedures:,,,, ultrasound used (permanent image in chart),,    Narrative:  Start time: 05/24/2024 7:45 AM End time: 05/24/2024 7:48 AM Injection made incrementally with aspirations every 5 mL.  Performed by: Personally  Anesthesiologist: Peggye Delon Brunswick, MD  Additional Notes: Discussed risks and benefits of nerve block including, but not limited to, prolonged and/or permanent nerve injury involving sensory and/or motor function. Monitors were applied and a time-out was performed. The nerve and associated structures were visualized under ultrasound guidance. After negative aspiration, local anesthetic was slowly injected around the nerve. There was no evidence of high pressure during the procedure. There were no paresthesias. VSS remained stable and the patient tolerated the procedure well.

## 2024-05-24 NOTE — Op Note (Signed)
 PATIENT ID:      [name]  MRN:     [id number] DOB/AGE:    [birth year] / 78 y.o.       OPERATIVE REPORT   DATE OF PROCEDURE:  05/24/2024      PREOPERATIVE DIAGNOSIS:   RIGHT KNEE DEGENERATIVE JOINT DISEASE      Estimated body mass index is 30.21 kg/m as calculated from the following:   Height as of this encounter: 5' 4 (1.626 m).   Weight as of this encounter: 79.8 kg.                                                       POSTOPERATIVE DIAGNOSIS:   Same                                                                  PROCEDURE:  Procedure(s): ARTHROPLASTY, KNEE, TOTAL Using DepuyAttune RP implants #6N Femur, #5Tibia, 6 mm Attune RP bearing, 38 Patella    SURGEON: [name]  ASSISTANT:   Camellia POUR. [employer]   (Present and scrubbed throughout the case, critical for assistance with exposure, retraction, instrumentation, and closure.)        ANESTHESIA: spinal, 20cc Exparel , 50cc 0.25% Marcaine  EBL: min cc FLUID REPLACEMENT: unk cc crystaloid TOURNIQUET: none DRAINS: None TRANEXAMIC ACID : 1gm IV, 2gm topical COMPLICATIONS:  None         INDICATIONS FOR PROCEDURE: The patient has  RIGHT KNEE DEGENERATIVE JOINT DISEASE, varus deformities, XR shows bone on bone arthritis, lateral subluxation of tibia. Patient has failed all conservative measures including anti-inflammatory medicines, narcotics, attempts at exercise and weight loss, cortisone injections and viscosupplementation.  Risks and benefits of surgery have been discussed, questions answered.   DESCRIPTION OF PROCEDURE: The patient identified by armband, received  IV antibiotics, in the holding area at [facility]. Patient taken to the operating room, appropriate anesthetic monitors were attached, and spinal anesthesia was  induced. IV Tranexamic acid  was given. Lateral post and 2 surefoot positioners applied to the table, the lower extremity was then prepped and draped in usual sterile fashion from the toes to  the high thigh. Time-out procedure was performed. Camellia POUR. [facility] PAC, was present and scrubbed throughout the case, critical for assistance with, positioning, exposure, retraction, instrumentation, and closure.The skin and subcutaneous tissue along the incision was injected with 20 cc of a mixture of 20cc Exparel  and 30cc Marcaine  50cc saline solution, using a 21-gauge by 1-1/2 inch needle. We began the operation, with the knee flexed 130 degrees, by making the anterior midline incision starting at handbreadth above the patella going over the patella 1 cm medial to and 4 cm distal to the tibial tubercle. Small bleeders in the skin and the subcutaneous tissue identified and cauterized. Transverse retinaculum was incised and reflected medially and a medial parapatellar arthrotomy was accomplished. the patella was everted and theprepatellar fat pad resected. The superficial medial collateral ligament was then elevated from anterior to posterior along the proximal flare of the tibia and anterior half of the menisci resected. The knee was hyperflexed exposing  bone on bone arthritis. Peripheral and notch osteophytes as well as the cruciate ligaments were then resected. We continued to work our way around posteriorly along the proximal tibia, and externally rotated the tibia subluxing it out from underneath the femur. A McHale PCL retractor was placed through the notch, a lateral Hohmann retractor, and anterolateral small homan retractor placed. We then entered the proximal tibia with the Depuy starter drill in line with the axis of the tibia followed by an intramedullary guide rod and 3 degree posterior slope cutting guide. The tibial cutting guide, was pinned into place allowing resection of 2 mm of bone medially and 10 mm of bone laterally. Satisfied with the tibial resection, we then entered the distal femur 2 mm anterior to the PCL origin with the starter drill, followed by the intramedullary guide rod and applied  the distal femoral cutting guide set at 9 mm, with 5 degrees of valgus. This was pinned along the epicondylar axis. At this point, the distal femoral cut was accomplished without difficulty. We then sized for a #6N femoral component and pinned the chamfer guide in 3 degrees of external rotation. The anterior, posterior, and chamfer cuts were accomplished without difficulty followed by the Attune RP box cutting guide and the box cut. We also removed posterior osteophytes from the posterior femoral condyles. The posterior capsule was injected with Exparel  solution. The knee was brought into full extension. We checked our extension gap and fit a 6 mm trial lollipop. Distracting in extension with a lamina spreader,  bleeders in the posterior capsule, Posterior medial and posterior lateral gutter were cauterized.  The transexamic acid-soaked sponge was then placed in the gap of the knee in extension. The knee was flexed 30. The posterior patella cut was accomplished with the 9.5 mm Attune cutting guide, sized for a 38 mm dome, and the fixation pegs drilled.The knee was then once again hyperflexed exposing the proximal tibia. We sized for a # 5 tibial base plate, applied the smokestack and the conical reamer followed by the the Delta fin keel punch. We then hammered into place the Attune RP trial femoral component, drilled the lugs, inserted a  6 mm trial bearing, trial patellar button, and took the knee through range of motion from 0-130 degrees. Medial and lateral ligamentous stability was checked. No thumb pressure was required for patellar Tracking.  All trial components were removed, mating surfaces irrigated with pulse lavage, and dried with suction and sponges. Exparel  solution was applied to the cancellus bone of the patella distal femur and proximal tibia.  After waiting 30 seconds, the bony surfaces were again, dried with sponges. A double batch of DePuy HV cement was mixed and applied to all bony metallic  mating surfaces except for the posterior condyles of the femur itself. In order, we hammered into place the tibial tray and removed excess cement, the femoral component and removed excess cement. The final Attune RP bearing was inserted, and the knee brought to full extension with compression. The patellar button was clamped into place, and excess cement removed. The knee was held at 30 flexion with compression using the second surefoot, while the cement cured. The wound was irrigated out with normal saline solution pulse lavage. The rest of the Exparel  was injected into the parapatellar arthrotomy, subcutaneous tissues, and periosteal tissues. The parapatellar arthrotomy was closed with running #1 Vicryl suture. The subcutaneous tissue with 3-0 undyed Vicryl suture, and the skin with running 3-0 SQ vicryl. An Aquacil dressing and  Ace wrap were applied. The patient was taken to recovery room without difficulty.   [name] 05/24/2024, 3:17 PM

## 2024-05-24 NOTE — Anesthesia Postprocedure Evaluation (Signed)
 Anesthesia Post Note  Patient: [name]  Procedure(s) Performed: ARTHROPLASTY, KNEE, TOTAL (Right: Knee)     Patient location during evaluation: PACU Anesthesia Type: MAC and Spinal Level of consciousness: awake Pain management: pain level controlled Vital Signs Assessment: post-procedure vital signs reviewed and stable Respiratory status: spontaneous breathing, respiratory function stable and nonlabored ventilation Cardiovascular status: blood pressure returned to baseline and stable Postop Assessment: no headache, no backache and no apparent nausea or vomiting Anesthetic complications: no   No notable events documented.  Last Vitals:  Vitals:   05/24/24 1200 05/24/24 1215  BP: 113/63 122/71  Pulse: 64 64  Resp: 14 13  Temp: 36.6 C   SpO2: 93% 97%    Last Pain:  Vitals:   05/24/24 1240  TempSrc:   PainSc: 5                  Delon Aisha Arch

## 2024-05-24 NOTE — Interval H&P Note (Signed)
 History and Physical Interval Note:  05/24/2024 7:34 AM  [name]  has presented today for surgery, with the diagnosis of RIGHT KNEE DEGENERATIVE JOINT DISEASE.  The various methods of treatment have been discussed with the patient and family. After consideration of risks, benefits and other options for treatment, the patient has consented to  Procedure(s): ARTHROPLASTY, KNEE, TOTAL (Right) as a surgical intervention.  The patient's history has been reviewed, patient examined, no change in status, stable for surgery.  I have reviewed the patient's chart and labs.  Questions were answered to the patient's satisfaction.     [name]

## 2024-05-24 NOTE — Discharge Instructions (Addendum)

## 2024-05-25 ENCOUNTER — Encounter (HOSPITAL_COMMUNITY): Payer: Self-pay | Admitting: Orthopedic Surgery

## 2024-05-25 DIAGNOSIS — I509 Heart failure, unspecified: Secondary | ICD-10-CM | POA: Diagnosis not present

## 2024-05-25 DIAGNOSIS — M1711 Unilateral primary osteoarthritis, right knee: Secondary | ICD-10-CM | POA: Diagnosis not present

## 2024-05-25 DIAGNOSIS — F1090 Alcohol use, unspecified, uncomplicated: Secondary | ICD-10-CM | POA: Diagnosis not present

## 2024-05-25 DIAGNOSIS — Z7982 Long term (current) use of aspirin: Secondary | ICD-10-CM | POA: Diagnosis not present

## 2024-05-25 DIAGNOSIS — Z96651 Presence of right artificial knee joint: Secondary | ICD-10-CM | POA: Diagnosis not present

## 2024-05-25 NOTE — TOC Transition Note (Signed)
 Transition of Care [facility]) - Discharge Note   Patient Details  Name: [name] MRN: [id number] Date of Birth: 27-Nov-1945  [facility] CM/SW Contact:  Alfonse JONELLE Rex, RN Phone Number: 05/25/2024, 10:34 AM   Clinical Narrative:   Met with patient at bedside to review dc therapy and home equipment needs, patient confirmed OPPT at [facility], has RW, no home equipment needs. No TOC needs.     Final next level of care: OP Rehab Barriers to Discharge: No Barriers Identified   Patient Goals and CMS Choice Patient states their goals for this hospitalization and ongoing recovery are:: return home          Discharge Placement                       Discharge Plan and Services Additional resources added to the After Visit Summary for                                       Social Drivers of Health (SDOH) Interventions SDOH Screenings   Food Insecurity: No Food Insecurity (05/24/2024)  Housing: Low Risk  (05/24/2024)  Transportation Needs: No Transportation Needs (05/24/2024)  Utilities: Not At Risk (05/24/2024)  Social Connections: Patient Declined (05/25/2024)  Tobacco Use: Low Risk  (05/24/2024)     Readmission Risk Interventions     No data to display

## 2024-05-25 NOTE — Progress Notes (Signed)
 Physical Therapy Treatment Patient Details Name: [name] MRN: [id number] DOB: 1946/03/10 Today's Date: 05/25/2024   History of Present Illness 78 yo female presents to therapy following R TKA on 05/24/2024 due to failure of conservative measures. Pt PMH includes but is not limited to: L ba ca s/p B mastectomy, HLD, carpal tunnel syndrome, OA, and OSA,.    PT Comments  POD # 1 pm session Pt feeling better.  Son present during session. Had Son hands on assist Pt with all mobility including stairs.  Pt was able to safely navigate 2 steps using both hands on one rail.  No c/o dizziness.  Tolerated an increased distance amb with walker. Addressed all mobility questions, discussed appropriate activity, educated on use of ICE.  Pt ready for D/C to home.    If plan is discharge home, recommend the following: Two people to help with walking and/or transfers;A little help with bathing/dressing/bathroom;Assistance with cooking/housework;Assist for transportation;Help with stairs or ramp for entrance   Can travel by private vehicle        Equipment Recommendations  None recommended by PT    Recommendations for Other Services       Precautions / Restrictions Precautions Precautions: Fall;Knee Precaution/Restrictions Comments: no pillow under knee Restrictions Weight Bearing Restrictions Per Provider Order: No Other Position/Activity Restrictions: WBAT     Mobility  Bed Mobility Overal bed mobility: Needs Assistance Bed Mobility: Supine to Sit     Supine to sit: Supervision     General bed mobility comments: OOB in recliner    Transfers Overall transfer level: Needs assistance Equipment used: Rolling walker (2 wheels) Transfers: Sit to/from Stand Sit to Stand: Supervision           General transfer comment: self able to rise with good use of hands    Ambulation/Gait Ambulation/Gait assistance: Supervision Gait Distance (Feet): 65 Feet Assistive device: Rolling  walker (2 wheels) Gait Pattern/deviations: Step-to pattern, Decreased stance time - right Gait velocity: decreased     General Gait Details: tolerated an increased distance with Son hands on using safety belt.  Instructed on proper walker to self distance and safety with turns.   Stairs Stairs: Yes Stairs assistance: Supervision, Contact guard assist Stair Management: One rail Left, Step to pattern, Forwards Number of Stairs: 2 General stair comments: with Son hands on using safety, Pt was able to safely navigate 2 steps using one rail with Son hands on assisting using safety belt.  Much better performance than this morning.   Wheelchair Mobility     Tilt Bed    Modified Rankin (Stroke Patients Only)       Balance                                            Communication Communication Communication: No apparent difficulties  Cognition Arousal: Alert Behavior During Therapy: WFL for tasks assessed/performed   PT - Cognitive impairments: No apparent impairments                       PT - Cognition Comments: AxO x 3 Pleasant and motivated.  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 Following commands: Intact      Cueing Cueing Techniques: Verbal cues  Exercises      General Comments General comments (skin integrity, edema, etc.):  R knee in ace wrap and non removeable dressing intact with TEDs over top, no SOB nor issues with skin or edema other than post op use of ice and elevation needs      Pertinent Vitals/Pain Pain Assessment Pain Assessment: 0-10 Pain Score: 3  Pain Location: R knee Pain Descriptors / Indicators: Discomfort, Operative site guarding Pain Intervention(s): Monitored during session, Repositioned, Ice applied    Home Living Family/patient expects to be discharged to:: Private residence Living Arrangements: Alone Available Help at Discharge: Family Type of  Home: House Home Access: Stairs to enter Entrance Stairs-Rails: Left Entrance Stairs-Number of Steps: 4   Home Layout: Two level;Able to live on main level with bedroom/bathroom Home Equipment: Rolling Walker (2 wheels);Cane - single point;Adaptive equipment Additional Comments: RW will fit in and out ofwalk in shower to manage threshold patient endorses with training    Prior Function            PT Goals (current goals can now be found in the care plan section) Progress towards PT goals: Progressing toward goals    Frequency    7X/week      PT Plan      Co-evaluation              AM-PAC PT 6 Clicks Mobility   Outcome Measure  Help needed turning from your back to your side while in a flat bed without using bedrails?: None Help needed moving from lying on your back to sitting on the side of a flat bed without using bedrails?: None Help needed moving to and from a bed to a chair (including a wheelchair)?: None Help needed standing up from a chair using your arms (e.g., wheelchair or bedside chair)?: None Help needed to walk in hospital room?: A Little Help needed climbing 3-5 steps with a railing? : A Little 6 Click Score: 22    End of Session Equipment Utilized During Treatment: Gait belt Activity Tolerance: Patient tolerated treatment well Patient left: in chair;with call bell/phone within reach Nurse Communication: Mobility status PT Visit Diagnosis: Unsteadiness on feet (R26.81);Other abnormalities of gait and mobility (R26.89);Muscle weakness (generalized) (M62.81);Difficulty in walking, not elsewhere classified (R26.2);Pain Pain - Right/Left: Right Pain - part of body: Knee;Leg     Time: 1351-1417 PT Time Calculation (min) (ACUTE ONLY): 26 min  Charges:    $Gait Training: 8-22 mins $Therapeutic Exercise: 8-22 mins $Therapeutic Activity: 8-22 mins PT General Charges $$ ACUTE PT VISIT: 1 Visit                     Katheryn Leap  PTA Acute   Rehabilitation Services Office M-F          [phone]

## 2024-05-25 NOTE — Discharge Summary (Signed)
 Patient ID: [name] MRN: [id number] DOB/AGE: [birth year] 78 y.o.  Admit date: 05/24/2024 Discharge date: 05/25/2024  Admission Diagnoses:  Principal Problem:   Primary osteoarthritis of right knee Active Problems:   S/P total knee arthroplasty, right   Discharge Diagnoses:  Same  Past Medical History:  Diagnosis Date   Allergy    seasonal allergies   Arthritis    Atrophic vaginitis    breast cancer 2014   LEFT   Breast lobular neoplasia    atypical   CHF (congestive heart failure) (HCC)    secondary to chemo-cleared up after chemo   Fibrocystic breast changes    GERD (gastroesophageal reflux disease)    hx of   History of kidney stones    Hyperlipidemia    on meds   Knee pain, bilateral    Major depression in full remission (HCC)    on meds   Microcalcifications of the breast    atypical ductal hyperplasia with calcifications   Osteopenia    Dr Winfred   PMB (postmenopausal bleeding)    Dr Winfred   Seasonal allergies    Sleep apnea    no cpap   Vitamin D  deficiency    Wears glasses     Surgeries: Procedure(s): Right ARTHROPLASTY, KNEE, TOTAL on 05/24/2024   Consultants:   Discharged Condition: Improved  Hospital Course: [name] is an 78 y.o. female who was admitted 05/24/2024 for operative treatment ofPrimary osteoarthritis of right knee. Patient has severe unremitting pain that affects sleep, daily activities, and work/hobbies. After pre-op clearance the patient was taken to the operating room on 05/24/2024 and underwent  Procedure(s): Right ARTHROPLASTY, KNEE, TOTAL.    Patient was given perioperative antibiotics:  Anti-infectives (From admission, onward)    Start     Dose/Rate Route Frequency Ordered Stop   05/24/24 0645  ceFAZolin  (ANCEF ) IVPB 2g/100 mL premix        2 g 200 mL/hr over 30 Minutes Intravenous On call to O.R. 05/24/24 [id number] 05/24/24 0919        Patient was given sequential compression devices, early ambulation, and  chemoprophylaxis to prevent DVT.  Inpatient Morphine Milligram Equivalents Per Day 9/8 - 9/9   Values displayed are in units of MME/Day    Order Start / End Date Yesterday Today    fentaNYL  (SUBLIMAZE ) injection 25-50 mcg 9/8 - 9/8 0 of 45-90 --    fentaNYL  (SUBLIMAZE ) injection 50-100 mcg 9/8 - 9/8 15 of 15-30 --    fentaNYL  (SUBLIMAZE ) injection 9/8 - 9/8 *15 of 15 --    HYDROmorphone  (DILAUDID ) injection 0.5-1 mg 9/8 - No end date 0 of 30-60 0 of 60-120    traMADol  (ULTRAM ) tablet 50 mg 9/8 - No end date 5 of 10 10 of 20    HYDROmorphone  (DILAUDID ) tablet 1-2 mg 9/8 - No end date 8 of 12-24 0 of 24-48    Daily Totals  * 43 of 127-229 10 of 104-188  *One-Step medication     Patient benefited maximally from hospital stay and there were no complications.    Recent vital signs: Patient Vitals for the past 24 hrs:  BP Temp Temp src Pulse Resp SpO2  05/25/24 0621 120/74 97.6 F (36.4 C) Oral 70 17 99 %  05/25/24 0251 (!) 105/58 97.7 F (36.5 C) -- 78 17 97 %  05/24/24 2242 127/63 97.6 F (36.4 C) -- 64 18 96 %  05/24/24 2038 112/68 97.6 F (36.4 C) Oral 73 18 98 %  05/24/24 1954 (!) 115/52 -- -- 68 -- 98 %  05/24/24 1657 134/70 -- -- 70 -- 97 %  05/24/24 1400 109/69 -- -- -- -- --  05/24/24 1300 -- -- -- 63 14 96 %  05/24/24 1215 122/71 -- -- 64 13 97 %  05/24/24 1200 113/63 97.8 F (36.6 C) -- 64 14 93 %  05/24/24 1145 131/69 -- -- 65 12 94 %  05/24/24 1130 124/79 -- -- 68 13 97 %  05/24/24 1124 124/76 (!) 97.5 F (36.4 C) -- 69 14 100 %  05/24/24 0835 -- -- -- 65 13 99 %  05/24/24 0830 129/72 -- -- 61 (!) 9 100 %  05/24/24 0825 -- -- -- 61 (!) 9 100 %  05/24/24 0820 -- -- -- 63 (!) 9 99 %  05/24/24 0815 124/76 -- -- 61 15 99 %     Recent laboratory studies: No results for input(s): WBC, HGB, HCT, PLT, NA, K, CL, CO2, BUN, CREATININE, GLUCOSE, INR, CALCIUM in the last 72 hours.  Invalid input(s): PT, 2   Discharge Medications:   Allergies  as of 05/25/2024       Reactions   Codeine Rash   Post C section   Oxycodone  Itching        Medication List     TAKE these medications    ALIGN PO Take by mouth.   anastrozole  1 MG tablet Commonly known as: ARIMIDEX  TAKE 1 TABLET(1 MG) BY MOUTH DAILY   aspirin  EC 325 MG tablet Take 1 tablet (325 mg total) by mouth 2 (two) times daily.   buPROPion  150 MG 24 hr tablet Commonly known as: WELLBUTRIN  XL Take 150 mg by mouth daily.   cholecalciferol 1000 units tablet Commonly known as: VITAMIN D  Take 2,000 Units by mouth daily.   cycloSPORINE  0.05 % ophthalmic emulsion Commonly known as: RESTASIS  Place 1 drop into both eyes 2 (two) times daily.   docusate sodium  100 MG capsule Commonly known as: Colace Take 1 capsule (100 mg total) by mouth 2 (two) times daily.   DULoxetine  60 MG capsule Commonly known as: CYMBALTA  Take 60 mg by mouth daily.   estradiol 0.1 MG/GM vaginal cream Commonly known as: ESTRACE Place 1 Applicatorful vaginally 3 (three) times a week.   HYDROmorphone  2 MG tablet Commonly known as: Dilaudid  Take 1 tablet (2 mg total) by mouth every 4 (four) hours as needed for severe pain (pain score 7-10).   loratadine  10 MG tablet Commonly known as: CLARITIN  Take 10 mg by mouth at bedtime.   meloxicam  15 MG tablet Commonly known as: MOBIC  Take 15 mg by mouth daily.   nitroGLYCERIN  0.2 mg/hr patch Commonly known as: NITRODUR - Dosed in mg/24 hr Place 0.2 mg onto the skin daily.   OMEGA-3 FATTY ACIDS PO Take by mouth.   tiZANidine  2 MG tablet Commonly known as: ZANAFLEX  Take 1 tablet (2 mg total) by mouth every 6 (six) hours as needed.   Zepbound 7.5 MG/0.5ML Pen Generic drug: tirzepatide Inject 7.5 mg into the skin once a week.               Durable Medical Equipment  (From admission, onward)           Start     Ordered   05/24/24 2036  DME 3 n 1  Once        05/24/24 2035   05/24/24 1153  DME Walker rolling  Once  Question:  Patient needs a walker to treat with the following condition  Answer:  Status post right knee replacement   05/24/24 1152              Discharge Care Instructions  (From admission, onward)           Start     Ordered   05/25/24 0000  Weight bearing as tolerated       Question Answer Comment  Laterality right   Extremity Lower      05/25/24 0813   05/24/24 0000  Weight bearing as tolerated        05/24/24 1124            Diagnostic Studies: DG Chest 2 View Result Date: 05/11/2024 CLINICAL DATA:  Preop chest exam.  Upcoming knee replacement. EXAM: CHEST - 2 VIEW COMPARISON:  Remote radiograph 05/04/2013 FINDINGS: The cardiomediastinal contours are normal. The lungs are clear. Pulmonary vasculature is normal. No consolidation, pleural effusion, or pneumothorax. No acute osseous abnormalities are seen. Thoracic spondylosis. Surgical clips in the left axilla. IMPRESSION: No active cardiopulmonary disease. Electronically Signed   By: Andrea Gasman M.D.   On: 05/11/2024 18:15    Disposition: Discharge disposition: 01-Home or Self Care       Discharge Instructions     Call MD / Call 911   Complete by: As directed    If you experience chest pain or shortness of breath, CALL 911 and be transported to the hospital emergency room.  If you develope a fever above 101 F, pus (white drainage) or increased drainage or redness at the wound, or calf pain, call your surgeon's office.   Call MD / Call 911   Complete by: As directed    If you experience chest pain or shortness of breath, CALL 911 and be transported to the hospital emergency room.  If you develope a fever above 101 F, pus (white drainage) or increased drainage or redness at the wound, or calf pain, call your surgeon's office.   Constipation Prevention   Complete by: As directed    Drink plenty of fluids.  Prune juice may be helpful.  You may use a stool softener, such as Colace (over the counter) 100 mg  twice a day.  Use MiraLax  (over the counter) for constipation as needed.   Diet - low sodium heart healthy   Complete by: As directed    Diet general   Complete by: As directed    Do not put a pillow under the knee. Place it under the heel.   Complete by: As directed    Driving restrictions   Complete by: As directed    No driving for 2 weeks   Increase activity slowly as tolerated   Complete by: As directed    Increase activity slowly as tolerated   Complete by: As directed    Patient may shower   Complete by: As directed    You may shower without a dressing once there is no drainage.  Do not wash over the wound.  If drainage remains, cover wound with plastic wrap and then shower.   Post-operative opioid taper instructions:   Complete by: As directed    POST-OPERATIVE OPIOID TAPER INSTRUCTIONS: It is important to wean off of your opioid medication as soon as possible. If you do not need pain medication after your surgery it is ok to stop day one. Opioids include: Codeine, Hydrocodone (Norco, Vicodin), Oxycodone (Percocet, oxycontin ) and hydromorphone  amongst others.  Long  term and even short term use of opiods can cause: Increased pain response Dependence Constipation Depression Respiratory depression And more.  Withdrawal symptoms can include Flu like symptoms Nausea, vomiting And more Techniques to manage these symptoms Hydrate well Eat regular healthy meals Stay active Use relaxation techniques(deep breathing, meditating, yoga) Do Not substitute Alcohol  to help with tapering If you have been on opioids for less than two weeks and do not have pain than it is ok to stop all together.  Plan to wean off of opioids This plan should start within one week post op of your joint replacement. Maintain the same interval or time between taking each dose and first decrease the dose.  Cut the total daily intake of opioids by one tablet each day Next start to increase the time between  doses. The last dose that should be eliminated is the evening dose.      Post-operative opioid taper instructions:   Complete by: As directed    POST-OPERATIVE OPIOID TAPER INSTRUCTIONS: It is important to wean off of your opioid medication as soon as possible. If you do not need pain medication after your surgery it is ok to stop day one. Opioids include: Codeine, Hydrocodone (Norco, Vicodin), Oxycodone (Percocet, oxycontin ) and hydromorphone  amongst others.  Long term and even short term use of opiods can cause: Increased pain response Dependence Constipation Depression Respiratory depression And more.  Withdrawal symptoms can include Flu like symptoms Nausea, vomiting And more Techniques to manage these symptoms Hydrate well Eat regular healthy meals Stay active Use relaxation techniques(deep breathing, meditating, yoga) Do Not substitute Alcohol  to help with tapering If you have been on opioids for less than two weeks and do not have pain than it is ok to stop all together.  Plan to wean off of opioids This plan should start within one week post op of your joint replacement. Maintain the same interval or time between taking each dose and first decrease the dose.  Cut the total daily intake of opioids by one tablet each day Next start to increase the time between doses. The last dose that should be eliminated is the evening dose.      TED hose   Complete by: As directed    Use stockings (TED hose) for 2 weeks on both leg(s).  You may remove them at night for sleeping.   Weight bearing as tolerated   Complete by: As directed    Weight bearing as tolerated   Complete by: As directed    Laterality: right   Extremity: Lower        Follow-up Information     Yvone Rush, MD. Go on 06/08/2024.   Specialty: Orthopedic Surgery Why: your appointment is scheduled for 10:00 Contact information: [address] [place] [address] [phone]         [facility], [place]. Go on 05/28/2024.   Why: Your outpatient physical therapy is scheduled for 2:40 Please arrive at 2:20 to complete your paperwork Contact information: Physical Therapy [address] [place] [address] [phone]                  Signed: Lynwood KANDICE Reining 05/25/2024, 8:14 AM

## 2024-05-25 NOTE — Progress Notes (Signed)
 Subjective: 1 Day Post-Op Procedure(s) (LRB): ARTHROPLASTY, KNEE, TOTAL (Right) Patient reports pain as mild.  Complains of mild numbness in her right heel.  Taking by mouth and voiding okay.  Wants to go home today after therapy.  Objective: Vital signs in last 24 hours: Temp:  [97.5 F (36.4 C)-97.8 F (36.6 C)] 97.6 F (36.4 C) (09/09 0621) Pulse Rate:  [61-78] 70 (09/09 0621) Resp:  [9-18] 17 (09/09 0621) BP: (105-134)/(52-79) 120/74 (09/09 0621) SpO2:  [93 %-100 %] 99 % (09/09 0621)  Intake/Output from previous day: 09/08 0701 - 09/09 0700 In: 2330 [P.O.:730; I.V.:1000; IV Piggyback:600] Out: 750 [Urine:700; Blood:50] Intake/Output this shift: No intake/output data recorded.  No results for input(s): HGB in the last 72 hours. No results for input(s): WBC, RBC, HCT, PLT in the last 72 hours. No results for input(s): NA, K, CL, CO2, BUN, CREATININE, GLUCOSE, CALCIUM in the last 72 hours. No results for input(s): LABPT, INR in the last 72 hours. Right knee exam: Neurovascular intact Sensation intact distally Intact pulses distally Dorsiflexion/Plantar flexion intact Incision: dressing C/D/I No cellulitis present Compartment soft   Assessment/Plan: 1 Day Post-Op Procedure(s) (LRB): ARTHROPLASTY, KNEE, TOTAL (Right) Plan: Up with therapy weight-bear as tolerated on the right. Discharge home today after PT. Aspirin  325 mg enteric-coated twice daily for DVT prophylaxis. Follow-up with Dr. Yvone in 2 weeks.    Patient's anticipated LOS is less than 2 midnights, meeting these requirements: -  - Lives within 1 hour of care - Has a competent adult at home to recover with post-op recover - NO history of  - Chronic pain requiring opiods  - Diabetes  - Coronary Artery Disease  - Heart failure  - Heart attack  - Stroke  - DVT/VTE  - Cardiac arrhythmia  - Respiratory Failure/COPD  - Renal failure  - Anemia  - Advanced Liver  disease     Lynwood KANDICE Reining 05/25/2024, 8:09 AM

## 2024-05-25 NOTE — Progress Notes (Signed)
 Discharge instructions given questions asked and answered.

## 2024-05-25 NOTE — Progress Notes (Signed)
 Physical Therapy Treatment Patient Details Name: [name] MRN: [id number] DOB: 1946-06-22 Today's Date: 05/25/2024   History of Present Illness 79 yo female presents to therapy following R TKA on 05/24/2024 due to failure of conservative measures. Pt PMH includes but is not limited to: L ba ca s/p B mastectomy, HLD, carpal tunnel syndrome, OA, and OSA,.    PT Comments  POD # 1 am session PT - Cognition Comments: AxO x 3 Pleasant and motivated.  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 First performed all supine TKE TE's while in bed following HEP handout.  Pt reports pain 3/10.  Assisted OOB to amb in hallway to practice stairs.  General stair comments: practiced up/down 2 steps using one rail when Pt had a near fall going down steps as she attempted to alternate her steps vs step to.  Therapist recovered using safety belt. Pt c/o dizziness and became pale.  BP 107/ 46.  Assisted to recliner and reported to RN. Pt will need another PT session this afternoon to ensure a safe D/C.    If plan is discharge home, recommend the following: Two people to help with walking and/or transfers;A little help with bathing/dressing/bathroom;Assistance with cooking/housework;Assist for transportation;Help with stairs or ramp for entrance   Can travel by private vehicle        Equipment Recommendations  None recommended by PT    Recommendations for Other Services       Precautions / Restrictions Precautions Precautions: Fall;Knee Precaution/Restrictions Comments: no pillow under knee Restrictions Weight Bearing Restrictions Per Provider Order: No Other Position/Activity Restrictions: WBAT     Mobility  Bed Mobility Overal bed mobility: Needs Assistance Bed Mobility: Supine to Sit     Supine to sit: Supervision     General bed mobility comments: Pt self able to transition to EOB with out assist.  Instructed on use of belt to self guide if  needed.    Transfers Overall transfer level: Needs assistance Equipment used: Rolling walker (2 wheels) Transfers: Sit to/from Stand Sit to Stand: Supervision, Contact guard assist           General transfer comment: 25% VC's on proper hand placement and safety with turns.    Ambulation/Gait Ambulation/Gait assistance: Supervision, Contact guard assist Gait Distance (Feet): 14 Feet Assistive device: Rolling walker (2 wheels) Gait Pattern/deviations: Step-to pattern, Decreased stance time - right Gait velocity: decreased     General Gait Details: limited distance to and from portable stairs due to increased c/o dizziness with activity.  Pt becoming pale.   Stairs Stairs: Yes Stairs assistance: Min assist Stair Management: One rail Left, Step to pattern, Forwards Number of Stairs: 2 General stair comments: practiced up/down 2 steps using one rail when Pt had a near fall going down steps as she attempted to alternate her steps vs step to.  Therapist recovered using safety belt.   Wheelchair Mobility     Tilt Bed    Modified Rankin (Stroke Patients Only)       Balance                                            Communication Communication Communication: No apparent difficulties  Cognition   Behavior During Therapy: WFL for tasks assessed/performed   PT - Cognitive impairments: No apparent impairments  PT - Cognition Comments: AxO x 3 Pleasant and motivated.  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 Following commands: Intact      Cueing Cueing Techniques: Verbal cues  Exercises  Total Knee Replacement TE's following HEP handout 10 reps B LE ankle pumps 05 reps towel squeezes 05 reps knee presses 05 reps heel slides  05 reps SAQ's 05 reps SLR's 05 reps ABD Educated on use of gait belt to assist with TE's Followed by ICE     General Comments         Pertinent Vitals/Pain Pain Assessment Pain Assessment: 0-10 Pain Score: 3  Pain Location: R knee Pain Descriptors / Indicators: Discomfort Pain Intervention(s): Monitored during session, Repositioned, Ice applied    Home Living                          Prior Function            PT Goals (current goals can now be found in the care plan section) Progress towards PT goals: Progressing toward goals    Frequency    7X/week      PT Plan      Co-evaluation              AM-PAC PT 6 Clicks Mobility   Outcome Measure  Help needed turning from your back to your side while in a flat bed without using bedrails?: A Little Help needed moving from lying on your back to sitting on the side of a flat bed without using bedrails?: A Little Help needed moving to and from a bed to a chair (including a wheelchair)?: A Little Help needed standing up from a chair using your arms (e.g., wheelchair or bedside chair)?: A Little Help needed to walk in hospital room?: A Little Help needed climbing 3-5 steps with a railing? : A Lot 6 Click Score: 17    End of Session Equipment Utilized During Treatment: Gait belt Activity Tolerance: Other (comment) (dizzy, pale and drop in BP) Patient left: in chair;with call bell/phone within reach Nurse Communication: Mobility status PT Visit Diagnosis: Unsteadiness on feet (R26.81);Other abnormalities of gait and mobility (R26.89);Muscle weakness (generalized) (M62.81);Difficulty in walking, not elsewhere classified (R26.2);Pain Pain - Right/Left: Right Pain - part of body: Knee;Leg     Time: 1000-1038 PT Time Calculation (min) (ACUTE ONLY): 38 min  Charges:    $Gait Training: 8-22 mins $Therapeutic Exercise: 8-22 mins $Therapeutic Activity: 8-22 mins PT General Charges $$ ACUTE PT VISIT: 1 Visit                     Katheryn Leap  PTA Acute  Rehabilitation Services Office M-F          [phone]

## 2024-05-25 NOTE — Evaluation (Signed)
 Occupational Therapy Evaluation Patient Details Name: [name] MRN: [id number] DOB: 07-12-1946 Today's Date: 05/25/2024   History of Present Illness   78 yo female presents to therapy following R TKA on 05/24/2024 due to failure of conservative measures. Pt PMH includes but is not limited to: L ba ca s/p B mastectomy, HLD, carpal tunnel syndrome, OA, and OSA,.     Clinical Impressions Patient evaluated by Occupational Therapy with no further acute OT needs identified. All education has been completed and the patient has no further questions. Son and patient educated on ADL and bathroom mobility, AE and DME with handouts provided and demo for carryover with + teach back and understanding.  See below for any follow-up Occupational Therapy or equipment needs. OT is signing off. Thank you for this referral.      If plan is discharge home, recommend the following:   A little help with walking and/or transfers;A little help with bathing/dressing/bathroom;Assistance with cooking/housework;Assist for transportation;Help with stairs or ramp for entrance     Functional Status Assessment   Patient has had a recent decline in their functional status and demonstrates the ability to make significant improvements in function in a reasonable and predictable amount of time.     Equipment Recommendations   Other (comment) (provided son and patient with handouts for DME for shower and AE if desired and how rto obtain with + teach back)      Precautions/Restrictions   Precautions Precautions: Fall;Knee Precaution/Restrictions Comments: no pillow under knee Restrictions Weight Bearing Restrictions Per Provider Order: No Other Position/Activity Restrictions: WBAT     Mobility Bed Mobility Overal bed mobility:  (patient in recliner upon arrival and handoff to PTA for her session)                  Transfers Overall transfer level: Needs assistance Equipment used: Rolling walker  (2 wheels) Transfers: Sit to/from Stand, Bed to chair/wheelchair/BSC Sit to Stand: Supervision Stand pivot transfers: Supervision         General transfer comment: demonstrated safety with shower stall threshold with RW with son and patient with gait belt use      Balance Overall balance assessment: Needs assistance Sitting-balance support: Feet supported Sitting balance-Leahy Scale: Good     Standing balance support: Bilateral upper extremity supported, During functional activity, Reliant on assistive device for balance Standing balance-Leahy Scale: Poor                             ADL either performed or assessed with clinical judgement   ADL Overall ADL's : Needs assistance/impaired Eating/Feeding: Independent   Grooming: Wash/dry hands;Wash/dry face;Oral care;Applying deodorant;Independent   Upper Body Bathing: Independent   Lower Body Bathing: Minimal assistance;Sit to/from stand Lower Body Bathing Details (indicate cue type and reason): educated on use of LH sponge Upper Body Dressing : Modified independent;Sitting   Lower Body Dressing: Minimal assistance;Sit to/from stand Lower Body Dressing Details (indicate cue type and reason): demonstrated use of adapted devices for TEDs, socks and pants donning Toilet Transfer: Supervision/safety;Rolling walker (2 wheels);Regular Toilet   Toileting- Clothing Manipulation and Hygiene: Set up;Sit to/from stand       Functional mobility during ADLs: Supervision/safety;Rolling walker (2 wheels) General ADL Comments: handouts provided for AE and DME needs with + teach back from patient and son to obtain and use as needed     Vision Baseline Vision/History: 0 No visual deficits;1 Wears glasses  Pertinent Vitals/Pain Pain Assessment Pain Assessment: 0-10 Pain Score: 2  Pain Location: R knee Pain Descriptors / Indicators: Discomfort Pain Intervention(s): Premedicated before session, Repositioned,  Ice applied     Extremity/Trunk Assessment Upper Extremity Assessment Upper Extremity Assessment: Left hand dominant;Overall WFL for tasks assessed   Lower Extremity Assessment Lower Extremity Assessment: Defer to PT evaluation   Cervical / Trunk Assessment Cervical / Trunk Assessment: Normal   Communication Communication Communication: No apparent difficulties   Cognition Arousal: Alert Behavior During Therapy: WFL for tasks assessed/performed Cognition: No apparent impairments                               Following commands: Intact       Cueing  General Comments   Cueing Techniques: Verbal cues  R knee in ace wrap and non removeable dressing intact with TEDs over top, no SOB nor issues with skin or edema other than post op use of ice and elevation needs           Home Living Family/patient expects to be discharged to:: Private residence Living Arrangements: Alone Available Help at Discharge: Family Type of Home: House Home Access: Stairs to enter Secretary/administrator of Steps: 4 Entrance Stairs-Rails: Left Home Layout: Two level;Able to live on main level with bedroom/bathroom     Bathroom Shower/Tub: Walk-in shower;Curtain   Bathroom Toilet: Handicapped height Bathroom Accessibility: Yes How Accessible: Accessible via walker Home Equipment: Rolling Walker (2 wheels);Cane - single point;Adaptive equipment Adaptive Equipment: Reacher Additional Comments: RW will fit in and out ofwalk in shower to manage threshold patient endorses with training      Prior Functioning/Environment Prior Level of Function : Independent/Modified Independent;Driving             Mobility Comments: IND no AD for all ADLs, self care tasks and IADLs      OT Problem List: Pain    AM-PAC OT 6 Clicks Daily Activity     Outcome Measure Help from another person eating meals?: None Help from another person taking care of personal grooming?: None Help from  another person toileting, which includes using toliet, bedpan, or urinal?: A Little Help from another person bathing (including washing, rinsing, drying)?: A Little Help from another person to put on and taking off regular upper body clothing?: None Help from another person to put on and taking off regular lower body clothing?: A Little 6 Click Score: 21   End of Session Equipment Utilized During Treatment: Gait belt;Rolling walker (2 wheels) Nurse Communication: Mobility status  Activity Tolerance: Patient tolerated treatment well Patient left: in chair;with call bell/phone within reach;with family/visitor present;Other (comment) (handoff to PTA)  OT Visit Diagnosis: Unsteadiness on feet (R26.81);Pain Pain - Right/Left: Right Pain - part of body: Knee                Time: 1325-1355 OT Time Calculation (min): 30 min Charges:  OT General Charges $OT Visit: 1 Visit OT Evaluation $OT Eval Low Complexity: 1 Low  Harbor Paster OT/L Acute Rehabilitation Department  [phone]  05/25/2024, 3:30 PM

## 2024-05-28 DIAGNOSIS — M25661 Stiffness of right knee, not elsewhere classified: Secondary | ICD-10-CM | POA: Diagnosis not present

## 2024-05-28 DIAGNOSIS — M25561 Pain in right knee: Secondary | ICD-10-CM | POA: Diagnosis not present

## 2024-05-28 DIAGNOSIS — R531 Weakness: Secondary | ICD-10-CM | POA: Diagnosis not present

## 2024-05-28 DIAGNOSIS — Z96651 Presence of right artificial knee joint: Secondary | ICD-10-CM | POA: Diagnosis not present

## 2024-05-31 DIAGNOSIS — Z961 Presence of intraocular lens: Secondary | ICD-10-CM | POA: Diagnosis not present

## 2024-06-01 DIAGNOSIS — M25661 Stiffness of right knee, not elsewhere classified: Secondary | ICD-10-CM | POA: Diagnosis not present

## 2024-06-01 DIAGNOSIS — M25561 Pain in right knee: Secondary | ICD-10-CM | POA: Diagnosis not present

## 2024-06-01 DIAGNOSIS — R531 Weakness: Secondary | ICD-10-CM | POA: Diagnosis not present

## 2024-06-01 DIAGNOSIS — R2689 Other abnormalities of gait and mobility: Secondary | ICD-10-CM | POA: Diagnosis not present

## 2024-06-04 DIAGNOSIS — R531 Weakness: Secondary | ICD-10-CM | POA: Diagnosis not present

## 2024-06-04 DIAGNOSIS — Z96651 Presence of right artificial knee joint: Secondary | ICD-10-CM | POA: Diagnosis not present

## 2024-06-04 DIAGNOSIS — M25661 Stiffness of right knee, not elsewhere classified: Secondary | ICD-10-CM | POA: Diagnosis not present

## 2024-06-04 DIAGNOSIS — M25561 Pain in right knee: Secondary | ICD-10-CM | POA: Diagnosis not present

## 2024-06-07 DIAGNOSIS — M25561 Pain in right knee: Secondary | ICD-10-CM | POA: Diagnosis not present

## 2024-06-07 DIAGNOSIS — Z96651 Presence of right artificial knee joint: Secondary | ICD-10-CM | POA: Diagnosis not present

## 2024-06-07 DIAGNOSIS — R531 Weakness: Secondary | ICD-10-CM | POA: Diagnosis not present

## 2024-06-07 DIAGNOSIS — M25661 Stiffness of right knee, not elsewhere classified: Secondary | ICD-10-CM | POA: Diagnosis not present

## 2024-06-08 DIAGNOSIS — M25561 Pain in right knee: Secondary | ICD-10-CM | POA: Diagnosis not present

## 2024-06-09 DIAGNOSIS — M25661 Stiffness of right knee, not elsewhere classified: Secondary | ICD-10-CM | POA: Diagnosis not present

## 2024-06-09 DIAGNOSIS — R531 Weakness: Secondary | ICD-10-CM | POA: Diagnosis not present

## 2024-06-09 DIAGNOSIS — M25561 Pain in right knee: Secondary | ICD-10-CM | POA: Diagnosis not present

## 2024-06-09 DIAGNOSIS — R2689 Other abnormalities of gait and mobility: Secondary | ICD-10-CM | POA: Diagnosis not present

## 2024-06-14 DIAGNOSIS — Z96651 Presence of right artificial knee joint: Secondary | ICD-10-CM | POA: Diagnosis not present

## 2024-06-14 DIAGNOSIS — M25661 Stiffness of right knee, not elsewhere classified: Secondary | ICD-10-CM | POA: Diagnosis not present

## 2024-06-14 DIAGNOSIS — M25561 Pain in right knee: Secondary | ICD-10-CM | POA: Diagnosis not present

## 2024-06-14 DIAGNOSIS — R531 Weakness: Secondary | ICD-10-CM | POA: Diagnosis not present

## 2024-06-16 DIAGNOSIS — R531 Weakness: Secondary | ICD-10-CM | POA: Diagnosis not present

## 2024-06-16 DIAGNOSIS — M25661 Stiffness of right knee, not elsewhere classified: Secondary | ICD-10-CM | POA: Diagnosis not present

## 2024-06-16 DIAGNOSIS — R2689 Other abnormalities of gait and mobility: Secondary | ICD-10-CM | POA: Diagnosis not present

## 2024-06-16 DIAGNOSIS — M25561 Pain in right knee: Secondary | ICD-10-CM | POA: Diagnosis not present

## 2024-06-17 DIAGNOSIS — E669 Obesity, unspecified: Secondary | ICD-10-CM | POA: Diagnosis not present

## 2024-06-17 DIAGNOSIS — E785 Hyperlipidemia, unspecified: Secondary | ICD-10-CM | POA: Diagnosis not present

## 2024-06-17 DIAGNOSIS — R7301 Impaired fasting glucose: Secondary | ICD-10-CM | POA: Diagnosis not present

## 2024-06-17 DIAGNOSIS — K219 Gastro-esophageal reflux disease without esophagitis: Secondary | ICD-10-CM | POA: Diagnosis not present

## 2024-06-17 DIAGNOSIS — F3342 Major depressive disorder, recurrent, in full remission: Secondary | ICD-10-CM | POA: Diagnosis not present

## 2024-06-17 DIAGNOSIS — G4733 Obstructive sleep apnea (adult) (pediatric): Secondary | ICD-10-CM | POA: Diagnosis not present

## 2024-06-17 DIAGNOSIS — G479 Sleep disorder, unspecified: Secondary | ICD-10-CM | POA: Diagnosis not present

## 2024-07-19 ENCOUNTER — Encounter: Payer: Self-pay | Admitting: Radiology

## 2024-09-13 ENCOUNTER — Other Ambulatory Visit: Payer: Self-pay | Admitting: Orthopedic Surgery

## 2024-09-17 DIAGNOSIS — Z01818 Encounter for other preprocedural examination: Secondary | ICD-10-CM | POA: Diagnosis present

## 2024-09-17 NOTE — Progress Notes (Signed)
 Sent message, via epic in basket, requesting orders in epic from Careers adviser.

## 2024-09-20 NOTE — Progress Notes (Addendum)
 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  Chest x-Bezdek - 05/11/24 Epic EKG -  05/11/24 Epic Stress Test -  ECHO - 03/31/13 Epic Cardiac Cath -   Bowel Prep - []  No  []   Yes ______  Pacemaker / ICD device []  No []  Yes   Spinal Cord Stimulator:[]  No []  Yes       History of Sleep Apnea? []  No []  Yes   CPAP used?- []  No []  Yes    Does the patient monitor blood sugar?          []  No []  Yes  []  N/A  Patient has: []  NO Hx DM   []  Pre-DM                 []  DM1  []   DM2 Does patient have a [employer] or [employer]om? []  No []  Yes   Fasting Blood Sugar Ranges-  Checks Blood Sugar _____ times a day  GLP1 agonist / usual dose -  GLP1 instructions:  SGLT-2 inhibitors / usual dose -  SGLT-2 instructions:   Blood Thinner / Instructions: Aspirin  Instructions:  Comments:   Activity level: Patient is able / unable to climb a flight of stairs without difficulty; []  No CP  []  No SOB, but would have ___   Patient can / can not perform ADLs without assistance.   Anesthesia review: OSA, CHF, Chemo induced cardiomyopathy. 2015  Patient denies shortness of breath, fever, cough and chest pain at PAT appointment.  Patient verbalized understanding and agreement to the Pre-Surgical Instructions that were given to them at this PAT appointment. Patient was also educated of the need to review these PAT instructions again prior to his/her surgery.I reviewed the appropriate phone numbers to call if they have any and questions or concerns.

## 2024-09-20 NOTE — Patient Instructions (Addendum)
 SURGICAL WAITING ROOM VISITATION  Patients having surgery or a procedure may have no more than 2 support people in the waiting area - these visitors may rotate.    Children ages 33 and under will not be able to visit patients in [facility] under most circumstances.   Visitors with respiratory illnesses are discouraged from visiting and should remain at home.  If the patient needs to stay at the hospital during part of their recovery, the visitor guidelines for inpatient rooms apply. Pre-op nurse will coordinate an appropriate time for 1 support person to accompany patient in pre-op.  This support person may not rotate.    Please refer to the [facility] website for the visitor guidelines for Inpatients (after your surgery is over and you are in a regular room).       Your procedure is scheduled on: 09/27/24   Report to [facility] Main Entrance    Report to admitting at 9:30 AM   Call this number if you have problems the morning of surgery [phone]   Do not eat food :After Midnight.   After Midnight you may have the following liquids until 9 AM DAY OF SURGERY  Water  Non-Citrus Juices (without pulp, NO RED-Apple, White grape, White cranberry) Black Coffee (NO MILK/CREAM OR CREAMERS, sugar ok)  Clear Tea (NO MILK/CREAM OR CREAMERS, sugar ok) regular and decaf                             Plain Jell-O (NO RED)                                           Fruit ices (not with fruit pulp, NO RED)                                     Popsicles (NO RED)                                                               Sports drinks like Gatorade (NO RED)               The day of surgery:  Drink ONE (1) G2 at 9 AM the morning of surgery. Drink in one sitting. Do not sip.  This drink was given to you during your hospital  pre-op appointment visit. Nothing else to drink after completing the  G2.          Oral Hygiene is also important to reduce your risk of infection.                                     Remember - BRUSH YOUR TEETH THE MORNING OF SURGERY WITH YOUR REGULAR TOOTHPASTE  DENTURES WILL BE REMOVED PRIOR TO SURGERY PLEASE DO NOT APPLY Poly grip OR ADHESIVES!!!  If you wear a CPAP machine for your sleep apnea please bring mask and tubing the day of surgery.   Stop all vitamins and herbal supplements 7  days before surgery.   Take these medicines the morning of surgery with A SIP OF WATER :  Bupropion  Duloxetine  May use eye drops    DO NOT TAKE TIRZEPATIDE AFTER 09/20/2024!!                              You may not have any metal on your body including hair pins, jewelry, and body piercing             Do not wear make-up, lotions, powders, perfumes/cologne, or deodorant  Do not wear nail polish including gel and S&S, artificial/acrylic nails, or any other type of covering on natural nails including finger and toenails. If you have artificial nails, gel coating, etc. that needs to be removed by a nail salon please have this removed prior to surgery or surgery may need to be canceled/ delayed if the surgeon/ anesthesia feels like they are unable to be safely monitored.            Do not bring valuables to the hospital. [facility] IS NOT             RESPONSIBLE   FOR VALUABLES.   Contacts, glasses, dentures or bridgework may not be worn into surgery.   DO NOT BRING YOUR HOME MEDICATIONS TO THE HOSPITAL. PHARMACY WILL DISPENSE MEDICATIONS LISTED ON YOUR MEDICATION LIST TO YOU DURING YOUR ADMISSION IN THE HOSPITAL!    Patients discharged on the day of surgery will not be allowed to drive home.  Someone NEEDS to stay with you for the first 24 hours after anesthesia.   Special Instructions: Bring a copy of your healthcare power of attorney and living will documents the day of surgery if you haven't scanned them before.              Please read over the following fact sheets you were given: IF YOU HAVE QUESTIONS ABOUT YOUR PRE-OP INSTRUCTIONS PLEASE  CALL [phone] [place]   If you received a COVID test during your pre-op visit  it is requested that you wear a mask when out in public, stay away from anyone that may not be feeling well and notify your surgeon if you develop symptoms. If you test positive for Covid or have been in contact with anyone that has tested positive in the last 10 days please notify you surgeon.      Pre-operative 4 CHG Bath Instructions  DYNA-Hex 4 Chlorhexidine  Gluconate 4% Solution Antiseptic 4 fl. oz   You can play a key role in reducing the risk of infection after surgery. Your skin needs to be as free of germs as possible. You can reduce the number of germs on your skin by washing with CHG (chlorhexidine  gluconate) soap before surgery. CHG is an antiseptic soap that kills germs and continues to kill germs even after washing.   DO NOT use if you have an allergy to chlorhexidine /CHG or antibacterial soaps. If your skin becomes reddened or irritated, stop using the CHG and notify one of our RNs at   Please shower with the CHG soap starting 4 days before surgery using the following schedule:     Please keep in mind the following:  DO NOT shave, including legs and underarms, starting the day of your first shower.   You may shave your face at any point before/day of surgery.  Place clean sheets on your bed the day you start using CHG soap. Use a clean  washcloth (not used since being washed) for each shower. DO NOT sleep with pets once you start using the CHG.  CHG Shower Instructions:  If you choose to wash your hair and private area, wash first with your normal shampoo/soap.  After you use shampoo/soap, rinse your hair and body thoroughly to remove shampoo/soap residue.  Turn the water  OFF and apply about 3 tablespoons (45 ml) of CHG soap to a CLEAN washcloth.  Apply CHG soap ONLY FROM YOUR NECK DOWN TO YOUR TOES (washing for 3-5 minutes)  DO NOT use CHG soap on face, private areas, open wounds, or sores.   Pay special attention to the area where your surgery is being performed.  If you are having back surgery, having someone wash your back for you may be helpful. Wait 2 minutes after CHG soap is applied, then you may rinse off the CHG soap.  Pat dry with a clean towel  Put on clean clothes/pajamas   If you choose to wear lotion, please use ONLY the CHG-compatible lotions on the back of this paper.     Additional instructions for the day of surgery: DO NOT APPLY any lotions, deodorants, cologne, or perfumes.   Put on clean/comfortable clothes.  Brush your teeth.  Ask your nurse before applying any prescription medications to the skin.   CHG Compatible Lotions   Aveeno Moisturizing lotion  Cetaphil Moisturizing Cream  Cetaphil Moisturizing Lotion  Clairol Herbal Essence Moisturizing Lotion, Dry Skin  Clairol Herbal Essence Moisturizing Lotion, Extra Dry Skin  Clairol Herbal Essence Moisturizing Lotion, Normal Skin  Curel Age Defying Therapeutic Moisturizing Lotion with Alpha Hydroxy  Curel Extreme Care Body Lotion  Curel Soothing Hands Moisturizing Hand Lotion  Curel Therapeutic Moisturizing Cream, Fragrance-Free  Curel Therapeutic Moisturizing Lotion, Fragrance-Free  Curel Therapeutic Moisturizing Lotion, Original Formula  Eucerin Daily Replenishing Lotion  Eucerin Dry Skin Therapy Plus Alpha Hydroxy Crme  Eucerin Dry Skin Therapy Plus Alpha Hydroxy Lotion  Eucerin Original Crme  Eucerin Original Lotion  Eucerin Plus Crme Eucerin Plus Lotion  Eucerin TriLipid Replenishing Lotion  Keri Anti-Bacterial Hand Lotion  Keri Deep Conditioning Original Lotion Dry Skin Formula Softly Scented  Keri Deep Conditioning Original Lotion, Fragrance Free Sensitive Skin Formula  Keri Lotion Fast Absorbing Fragrance Free Sensitive Skin Formula  Keri Lotion Fast Absorbing Softly Scented Dry Skin Formula  Keri Original Lotion  Keri Skin Renewal Lotion Keri Silky Smooth Lotion  Keri Silky Smooth  Sensitive Skin Lotion  Nivea Body Creamy Conditioning Oil  Nivea Body Extra Enriched Lotion  Nivea Body Original Lotion  Nivea Body Sheer Moisturizing Lotion Nivea Crme  Nivea Skin Firming Lotion  NutraDerm 30 Skin Lotion  NutraDerm Skin Lotion  NutraDerm Therapeutic Skin Cream  NutraDerm Therapeutic Skin Lotion  ProShield Protective Hand Cream   Incentive Spirometer  An incentive spirometer is a tool that can help keep your lungs clear and active. This tool measures how well you are filling your lungs with each breath. Taking long deep breaths may help reverse or decrease the chance of developing breathing (pulmonary) problems (especially infection) following: A long period of time when you are unable to move or be active. BEFORE THE PROCEDURE  If the spirometer includes an indicator to show your best effort, your nurse or respiratory therapist will set it to a desired goal. If possible, sit up straight or lean slightly forward. Try not to slouch. Hold the incentive spirometer in an upright position. INSTRUCTIONS FOR USE  Sit on the edge of your  bed if possible, or sit up as far as you can in bed or on a chair. Hold the incentive spirometer in an upright position. Breathe out normally. Place the mouthpiece in your mouth and seal your lips tightly around it. Breathe in slowly and as deeply as possible, raising the piston or the ball toward the top of the column. Hold your breath for 3-5 seconds or for as long as possible. Allow the piston or ball to fall to the bottom of the column. Remove the mouthpiece from your mouth and breathe out normally. Rest for a few seconds and repeat Steps 1 through 7 at least 10 times every 1-2 hours when you are awake. Take your time and take a few normal breaths between deep breaths. The spirometer may include an indicator to show your best effort. Use the indicator as a goal to work toward during each repetition. After each set of 10 deep breaths,  practice coughing to be sure your lungs are clear. If you have an incision (the cut made at the time of surgery), support your incision when coughing by placing a pillow or rolled up towels firmly against it. Once you are able to get out of bed, walk around indoors and cough well. You may stop using the incentive spirometer when instructed by your caregiver.  RISKS AND COMPLICATIONS Take your time so you do not get dizzy or light-headed. If you are in pain, you may need to take or ask for pain medication before doing incentive spirometry. It is harder to take a deep breath if you are having pain. AFTER USE Rest and breathe slowly and easily. It can be helpful to keep track of a log of your progress. Your caregiver can provide you with a simple table to help with this. If you are using the spirometer at home, follow these instructions: SEEK MEDICAL CARE IF:  You are having difficultly using the spirometer. You have trouble using the spirometer as often as instructed. Your pain medication is not giving enough relief while using the spirometer. You develop fever of 100.5 F (38.1 C) or higher. SEEK IMMEDIATE MEDICAL CARE IF:  You cough up bloody sputum that had not been present before. You develop fever of 102 F (38.9 C) or greater. You develop worsening pain at or near the incision site. MAKE SURE YOU:  Understand these instructions. Will watch your condition. Will get help right away if you are not doing well or get worse. Document Released: 01/13/2007 Document Revised: 11/25/2011 Document Reviewed: 03/16/2007 [facility] Patient Information 2014 [place], [place].

## 2024-09-21 ENCOUNTER — Encounter (HOSPITAL_COMMUNITY)
Admission: RE | Admit: 2024-09-21 | Discharge: 2024-09-21 | Disposition: A | Discharge status: Home / Self Care | Source: Ambulatory Visit | Attending: Orthopedic Surgery | Admitting: Orthopedic Surgery

## 2024-09-21 VITALS — BP 117/66 | HR 72 | Temp 97.8°F | Resp 16 | Ht 64.0 in | Wt 165.2 lb

## 2024-09-21 DIAGNOSIS — Z01818 Encounter for other preprocedural examination: Secondary | ICD-10-CM | POA: Diagnosis not present

## 2024-09-21 LAB — CBC
HCT: 43.1 % (ref 36.0–46.0)
Hemoglobin: 14 g/dL (ref 12.0–15.0)
MCH: 29 pg (ref 26.0–34.0)
MCHC: 32.5 g/dL (ref 30.0–36.0)
MCV: 89.4 fL (ref 80.0–100.0)
Platelets: 169 K/uL (ref 150–400)
RBC: 4.82 MIL/uL (ref 3.87–5.11)
RDW: 13.7 % (ref 11.5–15.5)
WBC: 5.5 K/uL (ref 4.0–10.5)
nRBC: 0 % (ref 0.0–0.2)

## 2024-09-21 LAB — BASIC METABOLIC PANEL WITH GFR
Anion gap: 9 (ref 5–15)
BUN: 15 mg/dL (ref 8–23)
CO2: 27 mmol/L (ref 22–32)
Calcium: 9.7 mg/dL (ref 8.9–10.3)
Chloride: 104 mmol/L (ref 98–111)
Creatinine, Ser: 0.74 mg/dL (ref 0.44–1.00)
GFR, Estimated: >60 mL/min
Glucose, Bld: 136 mg/dL — ABNORMAL HIGH (ref 70–99)
Potassium: 3.7 mmol/L (ref 3.5–5.1)
Sodium: 140 mmol/L (ref 135–145)

## 2024-09-21 LAB — SURGICAL PCR SCREEN
MRSA, PCR: NEGATIVE
Staphylococcus aureus: POSITIVE — AB

## 2024-09-22 NOTE — Progress Notes (Signed)
 COVID Vaccine received:  []  No []  Yes Date of any COVID positive Test in last 90 days:   PCP - Elsie Gentry MD Cardiologist - Sharilyn Code MD LOV 07/08/14-        Chemo induced cardiomyopathy.   Chest x-No - 05/11/24 Epic EKG -  05/11/24 Epic Stress Test -  ECHO - 03/31/13 Epic Cardiac Cath -    Pacemaker / ICD device [x]  No []  Yes   Spinal Cord Stimulator:[x]  No []  Yes       History of Sleep Apnea? []  No [x]  Yes   CPAP used?- [x]  No []  Yes     Does the patient monitor blood sugar?          []  No []  Yes  [x]  N/A   Patient has: [x]  NO Hx DM   []  Pre-DM                 []  DM1  []   DM2   GLP1 agonist / usual dose - Tirzepatide q Tuesday stopped 2 weeks ago   Blood Thinner / Instructions: none Aspirin  Instructions:  none   Activity level: Patient is able to climb a flight of stairs without difficulty; [x]  No CP  [x]  No SOB, but would have ___   Patient can perform ADLs without assistance.    Anesthesia review: OSA, CHF, Chemo induced cardiomyopathy. 2015   Patient denies shortness of breath, fever, cough and chest pain at PAT appointment.   Patient verbalized understanding and agreement to the Pre-Surgical Instructions that were given to them at this PAT appointment. Patient was also educated of the need to review these PAT instructions again prior to his/her surgery.I reviewed the appropriate phone numbers to call if they have any and questions or concerns.

## 2024-09-22 NOTE — Patient Instructions (Addendum)
 SURGICAL WAITING ROOM VISITATION Patients having surgery or a procedure may have no more than 2 support people in the waiting area - these visitors may rotate in the visitor waiting room.   If the patient needs to stay at the hospital during part of their recovery, the visitor guidelines for inpatient rooms apply.  PRE-OP VISITATION  Pre-op nurse will coordinate an appropriate time for 1 support person to accompany the patient in pre-op.  This support person may not rotate.  This visitor will be contacted when the time is appropriate for the visitor to come back in the pre-op area.  Please refer to the [facility] website for the visitor guidelines for Inpatients (after your surgery is over and you are in a regular room).  Temporary Visitor Restrictions  Children ages 67 and under will not be able to visit patients in [facility] under most circumstances. Visitation is not restricted outside of hospitals unless noted otherwise in the [facility] and Location Specific Visitation Guidelines at :      [email or web address] Visitors with respiratory illnesses are discouraged from visiting and should remain at home.  You are not required to quarantine at this time prior to your surgery. However, you must do this: Hand Hygiene often Do NOT share personal items Notify your provider if you are in close contact with someone who has COVID or you develop fever 100.4 or greater, new onset of sneezing, cough, sore throat, shortness of breath or body aches.  If you test positive for Covid or have been in contact with anyone that has tested positive in the last 10 days please notify you surgeon.    Your procedure is scheduled on:  Monday  09-28-2023  Report to [facility] Main Entrance: [address] entrance where the [employer] is available.   Report to admitting at: 09:30    AM  Call this number if you have any questions or problems the morning of surgery [phone]  Do not eat food  after Midnight the night prior to your surgery/procedure.  After Midnight you may have the following liquids until   09:00 AM DAY OF SURGERY  Clear Liquid Diet Water  Black Coffee (sugar ok, NO MILK/CREAM OR CREAMERS)  Tea (sugar ok, NO MILK/CREAM OR CREAMERS) regular and decaf                             Plain Jell-O  with no fruit (NO RED)                                           Fruit ices (not with fruit pulp, NO RED)                                     Popsicles (NO RED)                                                                  Juice: NO CITRUS JUICES: only apple, WHITE grape, WHITE cranberry Sports drinks like Gatorade or Powerade (NO RED)  FOLLOW ANY ADDITIONAL PRE OP INSTRUCTIONS YOU RECEIVED FROM YOUR SURGEON'S OFFICE!!!   Oral Hygiene is also important to reduce your risk of infection.        Remember - BRUSH YOUR TEETH THE MORNING OF SURGERY WITH YOUR REGULAR TOOTHPASTE  Do NOT smoke after Midnight the night before surgery.  STOP TAKING all Vitamins, Herbs and supplements 1 week before your surgery.   TIRZEPATIDE-  Last injection would be on TUESDAY 09-14-24  Take ONLY these medicines the morning of surgery with A SIP OF WATER : Duloxetine , Bupropion . And you may use your Eye Drops                  You may not have any metal on your body including hair pins, jewelry, and body piercing  Do not wear make-up, lotions, powders, perfumes or deodorant  Do not wear nail polish including gel and S&S, artificial / acrylic nails, or any other type of covering on natural nails including finger and toenails. If you have artificial nails, gel coating, etc., that needs to be removed by a nail salon, Please have this removed prior to surgery. Not doing so may mean that your surgery could be cancelled or delayed if the Surgeon or anesthesia staff feels like they are unable to monitor you safely.   Do not shave 48 hours prior to surgery to avoid nicks in your skin  which may contribute to postoperative infections.   Contacts, Hearing Aids, dentures or bridgework may not be worn into surgery. DENTURES WILL BE REMOVED PRIOR TO SURGERY PLEASE DO NOT APPLY Poly grip OR ADHESIVES!!!  Patients discharged on the day of surgery will not be allowed to drive home.  Someone NEEDS to stay with you for the first 24 hours after anesthesia.  Do not bring your home medications to the hospital. The Pharmacy will dispense medications listed on your medication list to you during your admission in the Hospital.  Please read over the following fact sheets you were given: IF YOU HAVE QUESTIONS ABOUT YOUR PRE-OP INSTRUCTIONS, PLEASE CALL [phone].      Pre-operative 4 CHG Bath Instructions   You can play a key role in reducing the risk of infection after surgery. Your skin needs to be as free of germs as possible. You can reduce the number of germs on your skin by washing with CHG (chlorhexidine  gluconate) soap before surgery. CHG is an antiseptic soap that kills germs and continues to kill germs even after washing.   DO NOT use if you have an allergy to chlorhexidine /CHG or antibacterial soaps. If your skin becomes reddened or irritated, stop using the CHG and notify one of our RNs at [phone]  Please shower with the CHG soap starting 4 days before surgery using the following schedule: THURSDAY  09-23-2024     Do NOT use CHG soap  the morning of your                                                                                                                                 surgery.         Please keep in mind the following:  DO NOT shave, including legs and underarms, starting the day of your first shower.   You may shave your face at any point before/day of surgery.  Place clean sheets on your bed the day you start using CHG soap. Use a clean  washcloth (not used since being washed) for each shower. DO NOT sleep with pets once you start using the CHG.  CHG Shower Instructions:  If you choose to wash your hair and private area, wash first with your normal shampoo/soap.  After you use shampoo/soap, rinse your hair and body thoroughly to remove shampoo/soap residue.  Turn the water  OFF and apply about 3 tablespoons (45 ml) of CHG soap to a CLEAN washcloth.  Apply CHG soap ONLY FROM YOUR NECK DOWN TO YOUR TOES (washing for 3-5 minutes)  DO NOT use CHG soap on face, private areas, open wounds, or sores.  Pay special attention to the area where your surgery is being performed.  If you are having back surgery, having someone wash your back for you may be helpful. Wait 2 minutes after CHG soap is applied, then you may rinse off the CHG soap.  Pat dry with a clean towel  Put on clean clothes/pajamas   If you choose to wear lotion, please use ONLY the CHG-compatible lotions on the back of this paper.     Additional instructions for the day of surgery: DO NOT APPLY any CHG Soap,  lotions, deodorants, cologne, or perfumes on the day of surgery  Put on clean/comfortable clothes.  Brush your teeth.  Ask your nurse before applying any prescription medications to the skin.   CHG Compatible Lotions   Aveeno Moisturizing lotion  Cetaphil Moisturizing Cream  Cetaphil Moisturizing Lotion  Clairol Herbal Essence Moisturizing Lotion, Dry Skin  Clairol Herbal Essence Moisturizing Lotion, Extra Dry Skin  Clairol Herbal Essence Moisturizing Lotion, Normal Skin  Curel Age Defying Therapeutic Moisturizing Lotion with Alpha Hydroxy  Curel Extreme Care Body Lotion  Curel Soothing Hands Moisturizing Hand Lotion  Curel Therapeutic Moisturizing Cream, Fragrance-Free  Curel Therapeutic Moisturizing Lotion, Fragrance-Free  Curel Therapeutic Moisturizing Lotion, Original Formula  Eucerin Daily Replenishing Lotion  Eucerin Dry Skin Therapy Plus Alpha  Hydroxy Crme  Eucerin Dry Skin Therapy Plus Alpha Hydroxy Lotion  Eucerin Original Crme  Eucerin Original Lotion  Eucerin Plus Crme Eucerin Plus Lotion  Eucerin TriLipid Replenishing Lotion  Keri Anti-Bacterial Hand Lotion  Keri Deep Conditioning Original Lotion Dry Skin Formula Softly Scented  Keri Deep Conditioning Original Lotion, Fragrance Free Sensitive Skin Formula  Keri Lotion Fast Absorbing Fragrance Free Sensitive Skin Formula  Keri Lotion Fast Absorbing Softly Scented Dry Skin  Formula  Keri Original Lotion  Keri Skin Renewal Lotion Keri Silky Smooth Lotion  Keri Silky Smooth Sensitive Skin Lotion  Nivea Body Creamy Conditioning Oil  Nivea Body Extra Enriched Lotion  Nivea Body Original Lotion  Nivea Body Sheer Moisturizing Lotion Nivea Crme  Nivea Skin Firming Lotion  NutraDerm 30 Skin Lotion  NutraDerm Skin Lotion  NutraDerm Therapeutic Skin Cream  NutraDerm Therapeutic Skin Lotion  ProShield Protective Hand Cream  Provon moisturizing lotion   FAILURE TO FOLLOW THESE INSTRUCTIONS MAY RESULT IN THE CANCELLATION OF YOUR SURGERY  PATIENT SIGNATURE_________________________________  NURSE SIGNATURE__________________________________  ________________________________________________________________________       Nasario Exon    An incentive spirometer is a tool that can help keep your lungs clear and active. This tool measures how well you are filling your lungs with each breath. Taking long deep breaths may help reverse or decrease the chance of developing breathing (pulmonary) problems (especially infection) following: A long period of time when you are unable to move or be active. BEFORE THE PROCEDURE  If the spirometer includes an indicator to show your best effort, your nurse or respiratory therapist will set it to a desired goal. If possible, sit up straight or lean slightly forward. Try not to slouch. Hold the incentive spirometer in an upright  position. INSTRUCTIONS FOR USE  Sit on the edge of your bed if possible, or sit up as far as you can in bed or on a chair. Hold the incentive spirometer in an upright position. Breathe out normally. Place the mouthpiece in your mouth and seal your lips tightly around it. Breathe in slowly and as deeply as possible, raising the piston or the ball toward the top of the column. Hold your breath for 3-5 seconds or for as long as possible. Allow the piston or ball to fall to the bottom of the column. Remove the mouthpiece from your mouth and breathe out normally. Rest for a few seconds and repeat Steps 1 through 7 at least 10 times every 1-2 hours when you are awake. Take your time and take a few normal breaths between deep breaths. The spirometer may include an indicator to show your best effort. Use the indicator as a goal to work toward during each repetition. After each set of 10 deep breaths, practice coughing to be sure your lungs are clear. If you have an incision (the cut made at the time of surgery), support your incision when coughing by placing a pillow or rolled up towels firmly against it. Once you are able to get out of bed, walk around indoors and cough well. You may stop using the incentive spirometer when instructed by your caregiver.  RISKS AND COMPLICATIONS Take your time so you do not get dizzy or light-headed. If you are in pain, you may need to take or ask for pain medication before doing incentive spirometry. It is harder to take a deep breath if you are having pain. AFTER USE Rest and breathe slowly and easily. It can be helpful to keep track of a log of your progress. Your caregiver can provide you with a simple table to help with this. If you are using the spirometer at home, follow these instructions: SEEK MEDICAL CARE IF:  You are having difficultly using the spirometer. You have trouble using the spirometer as often as instructed. Your pain medication is not giving  enough relief while using the spirometer. You develop fever of 100.5 F (38.1 C) or higher.  SEEK IMMEDIATE MEDICAL CARE IF:  You cough up bloody sputum that had not been present before. You develop fever of 102 F (38.9 C) or greater. You develop worsening pain at or near the incision site. MAKE SURE YOU:  Understand these instructions. Will watch your condition. Will get help right away if you are not doing well or get worse. Document Released: 01/13/2007 Document Revised: 11/25/2011 Document Reviewed: 03/16/2007 [facility] Patient Information 2014 [place], [place].

## 2024-09-22 NOTE — Progress Notes (Signed)
 Patient's PCR screen is positive for STAPH. Appropriate notes have been placed on the patient's chart. This note has been routed to Dr. Yvone and Dagoberto Kipper for review. The Patient's surgery is currently scheduled for: 09-27-2024 at [facility].  Shawnee Aloe, BSN, CVRN-BC   Pre-Surgical Testing Nurse [facility]  [phone]

## 2024-09-23 ENCOUNTER — Encounter (HOSPITAL_COMMUNITY)
Admission: RE | Admit: 2024-09-23 | Discharge: 2024-09-23 | Disposition: A | Discharge status: Home / Self Care | Source: Ambulatory Visit | Attending: Orthopedic Surgery | Admitting: Orthopedic Surgery

## 2024-09-23 NOTE — Care Plan (Signed)
 Ortho Bundle Case Management Note  Patient Details  Name: [name] MRN: [id number] Date of Birth: [birth year]  met with patient in the office for H&P. will discharge to home with family to assist. has DME. OPPT set up with [facility] discharge instructions discussed and questions answered.  Patient and MD in agreement with plan. Choice offered.                    DME Arranged:    DME Agency:     HH Arranged:    HH Agency:     Additional Comments: Please contact me with any questions of if this plan should need to change.  Charlies Pitch,  RN,BSN,MHA,CCM  [facility] Orthopaedic Specialist  [phone] 09/23/2024, 12:02 PM

## 2024-09-24 DIAGNOSIS — M1712 Unilateral primary osteoarthritis, left knee: Secondary | ICD-10-CM | POA: Diagnosis present

## 2024-09-24 NOTE — H&P (Signed)
 TOTAL KNEE ADMISSION H&P  Patient is being admitted for left total knee arthroplasty.  Subjective:  Chief Complaint:left knee pain.  HPI: [name], 79 y.o. female, has a history of pain and functional disability in the left knee due to arthritis and has failed non-surgical conservative treatments for greater than 12 weeks to includeNSAID's and/or analgesics, corticosteriod injections, flexibility and strengthening excercises, use of assistive devices, and activity modification.  Onset of symptoms was gradual, starting 1 years ago with gradually worsening course since that time. The patient noted no past surgery on the left knee(s).  Patient currently rates pain in the left knee(s) at 10 out of 10 with activity. Patient has night pain, worsening of pain with activity and weight bearing, pain that interferes with activities of daily living, pain with passive range of motion, crepitus, and joint swelling.  Patient has evidence of joint space narrowing by imaging studies. There is no active infection.  Patient Active Problem List   Diagnosis Date Noted   Degenerative arthritis of left knee 09/24/2024   S/P total knee arthroplasty, right 05/24/2024   Primary osteoarthritis of right knee 05/23/2024   Malignant neoplasm of overlapping sites of left breast in female, estrogen receptor positive (HCC) 09/28/2018   Sciatica, right side 06/29/2018   Personal history of malignant neoplasm of breast 10/08/2013   Ovarian fibroma 04/14/2013   Benign neoplasm of colon 02/09/2013   Pelvic mass in female 01/27/2013   Osteopenia 01/12/2013   PMB (postmenopausal bleeding)    Atrophic vaginitis    Vitamin D  deficiency 11/15/2008   OTHER ABNORMAL GLUCOSE 11/15/2008   HYPERLIPIDEMIA 08/17/2007   Carpal tunnel syndrome 07/03/2007   FEMORAL BRUIT 07/03/2007   Past Medical History:  Diagnosis Date   Allergy    seasonal allergies   Arthritis    Atrophic vaginitis    breast cancer 2014   LEFT   Breast  lobular neoplasia    atypical   CHF (congestive heart failure) (HCC)    secondary to chemo-cleared up after chemo   Fibrocystic breast changes    GERD (gastroesophageal reflux disease)    hx of   History of kidney stones    Hyperlipidemia    on meds   Knee pain, bilateral    Major depression in full remission    on meds   Microcalcifications of the breast    atypical ductal hyperplasia with calcifications   Osteopenia    Dr Winfred   PMB (postmenopausal bleeding)    Dr Winfred   Seasonal allergies    Sleep apnea    no cpap   Vitamin D  deficiency    Wears glasses     Past Surgical History:  Procedure Laterality Date   ABDOMINAL HYSTERECTOMY N/A 03/02/2013   Procedure: TOTAL HYSTERECTOMY ABDOMINAL;  Surgeon: Elenore A. Dodie, MD;  Location: [facility];  Service: Gynecology;  Laterality: N/A;   AXILLARY LYMPH NODE BIOPSY Left 04/08/2013   Procedure: AXILLARY LYMPH NODE BIOPSY and axillary lymph node disection;  Surgeon: Krystal CHRISTELLA Spinner, MD;  Location: [facility];  Service: General;  Laterality: Left;  nuclear medicine injection    BREAST BIOPSY  01/09/2012   Procedure: BREAST BIOPSY WITH NEEDLE LOCALIZATION;  Surgeon: Krystal CHRISTELLA Spinner, MD;  Location:  SURGERY CENTER;  Service: General;  Laterality: Right;   CATARACT EXTRACTION, BILATERAL     CESAREAN SECTION  1976   COLONOSCOPY  1986, [id number]   polyps 2013 only, Dr. Aneita   COLONOSCOPY  2018  MS-MAC-hems/4 polyps/tics/SPP/adeq w/ lavage and suction   DILATION AND CURETTAGE OF UTERUS     ENDOMETRIAL BIOPSY  2002   Uterine  polyps, Dr. Norval   HYSTEROSCOPY     [facility] REMOVAL Right 01/02/2015   Procedure: REMOVAL PORT-A-CATH;  Surgeon: Krystal Spinner, MD;  Location: [facility];  Service: General;  Laterality: Right;   PORTACATH PLACEMENT Right 05/04/2013   Procedure: INSERTION PORT-A-CATH;  Surgeon: Krystal CHRISTELLA Spinner, MD;  Location: [facility];  Service: General;   Laterality: Right;   SALPINGOOPHORECTOMY Bilateral 03/02/2013   Procedure: BILATERAL SALPINGO OOPHORECTOMY;  Surgeon: Elenore A. Dodie, MD;  Location: [facility];  Service: Gynecology;  Laterality: Bilateral;   TOTAL KNEE ARTHROPLASTY Right 05/24/2024   Procedure: ARTHROPLASTY, KNEE, TOTAL;  Surgeon: Yvone Rush, MD;  Location: [facility];  Service: Orthopedics;  Laterality: Right;   TOTAL MASTECTOMY Bilateral 04/08/2013   Procedure: TOTAL MASTECTOMY;  Surgeon: Krystal CHRISTELLA Spinner, MD;  Location: [facility];  Service: General;  Laterality: Bilateral;  needs bed for over night stay   WISDOM TOOTH EXTRACTION      Current Facility-Administered Medications  Medication Dose Route Frequency Provider Last Rate Last Admin   0.9 %  sodium chloride  infusion  500 mL Intravenous Continuous Aneita Gwendlyn DASEN, MD       Current Outpatient Medications  Medication Sig Dispense Refill Last Dose/Taking   buPROPion  (WELLBUTRIN  XL) 150 MG 24 hr tablet Take 150 mg by mouth in the morning.  11 Taking   cycloSPORINE  (RESTASIS ) 0.05 % ophthalmic emulsion Place 1 drop into both eyes 2 (two) times daily.   Taking   diphenhydramine -acetaminophen  (TYLENOL  PM) 25-500 MG TABS tablet Take 1-2 tablets by mouth at bedtime as needed (sleep).   Taking As Needed   DULoxetine  (CYMBALTA ) 60 MG capsule Take 60 mg by mouth in the morning.   Taking   estradiol (ESTRACE) 0.1 MG/GM vaginal cream Place 1 Applicatorful vaginally 3 (three) times a week.   Taking   fexofenadine (ALLEGRA) 180 MG tablet Take 180 mg by mouth every evening.   Taking   OMEGA-3 FATTY ACIDS PO Take 1 capsule by mouth every evening.   Taking   Probiotic Product (ALIGN PO) Take 1 capsule by mouth every evening.   Taking   tirzepatide 10 MG/0.5ML injection vial Inject 10 mg into the skin every Tuesday.   Taking   Allergies[1]  Social History   Tobacco Use   Smoking status: Never   Smokeless tobacco: Never   Tobacco comments:    second hand smoke for decades   Substance Use Topics   Alcohol  use: Yes    Alcohol /week: 4.0 standard drinks of alcohol     Types: 4 Standard drinks or equivalent per week    Comment: socially 1 - 2 glasses of wine per week    Family History  Problem Relation Age of Onset   Alcohol  abuse Mother    Alcohol  abuse Father    Von Willebrand disease Father    Bone cancer Father        bone marrow cancer   Depression Son        Recovering alcoholic   Depression Maternal Aunt    Prostate cancer Maternal Uncle    Diabetes Paternal Uncle    Heart attack Paternal Uncle 98   Heart failure Paternal Grandmother        in 57s   GER disease Paternal Grandmother        esophageal stricture  Skin cancer Paternal Grandmother    GER disease Son    Colon cancer Neg Hx    Stomach cancer Neg Hx    Stroke Neg Hx    Colon polyps Neg Hx    Esophageal cancer Neg Hx    Rectal cancer Neg Hx      Review of Systems  Constitutional: Negative.   HENT: Negative.    Eyes: Negative.   Respiratory: Negative.    Cardiovascular: Negative.   Gastrointestinal:        Reflux  Endocrine: Negative.   Genitourinary: Negative.   Musculoskeletal:  Positive for arthralgias.  Skin: Negative.   Allergic/Immunologic: Negative.   Neurological: Negative.   Hematological: Negative.   Psychiatric/Behavioral: Negative.      Objective:  Physical Exam Constitutional:      Appearance: Normal appearance. She is normal weight.  HENT:     Head: Normocephalic and atraumatic.     Nose: Nose normal.  Eyes:     Pupils: Pupils are equal, round, and reactive to light.  Cardiovascular:     Pulses: Normal pulses.  Pulmonary:     Effort: Pulmonary effort is normal.  Musculoskeletal:        General: Tenderness present.     Cervical back: Normal range of motion and neck supple.     Comments: Right knee has range of motion 0 to 125 degrees. No instability. No effusion. Left knee is tender to palpation over the medial joint line. Pain and grinding  through range of motion. Trace of any effusion. No erythema or warmth.  Skin:    General: Skin is warm and dry.  Neurological:     General: No focal deficit present.     Mental Status: She is alert and oriented to person, place, and time. Mental status is at baseline.  Psychiatric:        Mood and Affect: Mood normal.        Behavior: Behavior normal.        Thought Content: Thought content normal.        Judgment: Judgment normal.     Vital signs in last 24 hours:    Labs:   Estimated body mass index is 27.98 kg/m as calculated from the following:   Height as of 09/23/24: 5' 4 (1.626 m).   Weight as of 09/23/24: 73.9 kg.   Imaging Review Plain radiographs demonstrate  bone-on-bone arthritis medial compartment with subchondral sclerosis   Assessment/Plan:  End stage arthritis, left knee   The patient history, physical examination, clinical judgment of the provider and imaging studies are consistent with end stage degenerative joint disease of the left knee(s) and total knee arthroplasty is deemed medically necessary. The treatment options including medical management, injection therapy arthroscopy and arthroplasty were discussed at length. The risks and benefits of total knee arthroplasty were presented and reviewed. The risks due to aseptic loosening, infection, stiffness, patella tracking problems, thromboembolic complications and other imponderables were discussed. The patient acknowledged the explanation, agreed to proceed with the plan and consent was signed. Patient is being admitted for inpatient treatment for surgery, pain control, PT, OT, prophylactic antibiotics, VTE prophylaxis, progressive ambulation and ADL's and discharge planning. The patient is planning to be discharged home with home health services     Patient's anticipated LOS is less than 2 midnights, meeting these requirements: - Younger than 70 - Lives within 1 hour of care - Has a competent adult at home  to recover with post-op recover - NO  history of  - Chronic pain requiring opiods  - Diabetes  - Coronary Artery Disease  - Heart failure  - Heart attack  - Stroke  - DVT/VTE  - Cardiac arrhythmia  - Respiratory Failure/COPD  - Renal failure  - Anemia  - Advanced Liver disease       [1]  Allergies Allergen Reactions   Codeine Rash    Post C section   Oxycodone  Itching

## 2024-09-27 ENCOUNTER — Ambulatory Visit (HOSPITAL_COMMUNITY)
Admission: RE | Admit: 2024-09-27 | Discharge: 2024-09-27 | Disposition: A | Source: Ambulatory Visit | Attending: Orthopedic Surgery | Admitting: Orthopedic Surgery

## 2024-09-27 ENCOUNTER — Encounter (HOSPITAL_COMMUNITY): Admission: RE | Payer: Self-pay | Source: Home / Self Care

## 2024-09-27 ENCOUNTER — Ambulatory Visit (HOSPITAL_COMMUNITY): Admitting: Anesthesiology

## 2024-09-27 ENCOUNTER — Other Ambulatory Visit: Payer: Self-pay

## 2024-09-27 DIAGNOSIS — K219 Gastro-esophageal reflux disease without esophagitis: Secondary | ICD-10-CM | POA: Insufficient documentation

## 2024-09-27 DIAGNOSIS — E785 Hyperlipidemia, unspecified: Secondary | ICD-10-CM | POA: Insufficient documentation

## 2024-09-27 DIAGNOSIS — M1712 Unilateral primary osteoarthritis, left knee: Secondary | ICD-10-CM | POA: Diagnosis not present

## 2024-09-27 DIAGNOSIS — I509 Heart failure, unspecified: Secondary | ICD-10-CM | POA: Diagnosis not present

## 2024-09-27 DIAGNOSIS — Z79899 Other long term (current) drug therapy: Secondary | ICD-10-CM | POA: Diagnosis not present

## 2024-09-27 DIAGNOSIS — Z853 Personal history of malignant neoplasm of breast: Secondary | ICD-10-CM | POA: Diagnosis not present

## 2024-09-27 DIAGNOSIS — G4730 Sleep apnea, unspecified: Secondary | ICD-10-CM

## 2024-09-27 DIAGNOSIS — F32A Depression, unspecified: Secondary | ICD-10-CM | POA: Diagnosis not present

## 2024-09-27 DIAGNOSIS — M8580 Other specified disorders of bone density and structure, unspecified site: Secondary | ICD-10-CM | POA: Diagnosis not present

## 2024-09-27 DIAGNOSIS — Z7985 Long-term (current) use of injectable non-insulin antidiabetic drugs: Secondary | ICD-10-CM | POA: Insufficient documentation

## 2024-09-27 DIAGNOSIS — Z01818 Encounter for other preprocedural examination: Secondary | ICD-10-CM

## 2024-09-27 HISTORY — PX: TOTAL KNEE ARTHROPLASTY: SHX125

## 2024-09-27 SURGERY — ARTHROPLASTY, KNEE, TOTAL
Anesthesia: Spinal | Site: Knee | Laterality: Left

## 2024-09-27 MED ORDER — MIDAZOLAM HCL (PF) 2 MG/2ML IJ SOLN
1.0000 mg | INTRAMUSCULAR | Status: DC
  Administered 2024-09-27: 1 mg via INTRAVENOUS
  Filled 2024-09-27: qty 2

## 2024-09-27 MED ORDER — POVIDONE-IODINE 10 % EX SWAB
2.0000 | Freq: Once | CUTANEOUS | Status: AC
  Administered 2024-09-27: 2 via TOPICAL

## 2024-09-27 MED ORDER — ACETAMINOPHEN 10 MG/ML IV SOLN
INTRAVENOUS | Status: AC
  Filled 2024-09-27: qty 100

## 2024-09-27 MED ORDER — BUPIVACAINE LIPOSOME 1.3 % IJ SUSP: 20.0000 mL | Freq: Once | INTRAMUSCULAR | Status: DC

## 2024-09-27 MED ORDER — MUPIROCIN 2 % EX OINT: 1.0000 | TOPICAL_OINTMENT | Freq: Two times a day (BID) | CUTANEOUS | 0 refills | Status: AC

## 2024-09-27 MED ORDER — HYDROCODONE-ACETAMINOPHEN 5-325 MG PO TABS: 1.0000 | ORAL_TABLET | ORAL | 0 refills | Status: AC | PRN

## 2024-09-27 MED ORDER — CEFAZOLIN SODIUM-DEXTROSE 2-4 GM/100ML-% IV SOLN
2.0000 g | INTRAVENOUS | Status: AC
  Administered 2024-09-27: 2 g via INTRAVENOUS
  Filled 2024-09-27: qty 100

## 2024-09-27 MED ORDER — LACTATED RINGERS IV SOLN: INTRAVENOUS | Status: DC

## 2024-09-27 MED ORDER — LIDOCAINE HCL (PF) 2 % IJ SOLN
INTRAMUSCULAR | Status: DC | PRN
  Administered 2024-09-27: 30 mg via INTRADERMAL

## 2024-09-27 MED ORDER — ORAL CARE MOUTH RINSE: 15.0000 mL | Freq: Once | OROMUCOSAL | Status: AC

## 2024-09-27 MED ORDER — ACETAMINOPHEN 10 MG/ML IV SOLN
1000.0000 mg | Freq: Once | INTRAVENOUS | Status: DC | PRN
  Administered 2024-09-27: 1000 mg via INTRAVENOUS

## 2024-09-27 MED ORDER — DEXAMETHASONE SOD PHOSPHATE PF 10 MG/ML IJ SOLN
INTRAMUSCULAR | Status: AC
  Filled 2024-09-27: qty 1

## 2024-09-27 MED ORDER — CHLORHEXIDINE GLUCONATE 0.12 % MT SOLN
15.0000 mL | Freq: Once | OROMUCOSAL | Status: AC
  Administered 2024-09-27: 15 mL via OROMUCOSAL

## 2024-09-27 MED ORDER — MEPERIDINE HCL 25 MG/ML IJ SOLN: 6.2500 mg | INTRAMUSCULAR | Status: DC | PRN

## 2024-09-27 MED ORDER — HYDROMORPHONE HCL 1 MG/ML IJ SOLN
INTRAMUSCULAR | Status: AC
  Filled 2024-09-27: qty 1

## 2024-09-27 MED ORDER — ONDANSETRON HCL 4 MG/2ML IJ SOLN
INTRAMUSCULAR | Status: AC
  Filled 2024-09-27: qty 2

## 2024-09-27 MED ORDER — CHLORHEXIDINE GLUCONATE 4 % EX SOLN: 1.0000 | CUTANEOUS | 1 refills | Status: AC

## 2024-09-27 MED ORDER — TRANEXAMIC ACID-NACL 1000-0.7 MG/100ML-% IV SOLN
INTRAVENOUS | Status: AC
  Filled 2024-09-27: qty 100

## 2024-09-27 MED ORDER — DEXAMETHASONE SOD PHOSPHATE PF 10 MG/ML IJ SOLN
INTRAMUSCULAR | Status: DC | PRN
  Administered 2024-09-27: 5 mg via INTRAVENOUS

## 2024-09-27 MED ORDER — EPHEDRINE SULFATE (PRESSORS) 25 MG/5ML IV SOSY
PREFILLED_SYRINGE | INTRAVENOUS | Status: DC | PRN
  Administered 2024-09-27: 7.5 mg via INTRAVENOUS
  Administered 2024-09-27: 5 mg via INTRAVENOUS
  Administered 2024-09-27: 10 mg via INTRAVENOUS
  Administered 2024-09-27: 7.5 mg via INTRAVENOUS

## 2024-09-27 MED ORDER — ONDANSETRON HCL 4 MG/2ML IJ SOLN
INTRAMUSCULAR | Status: DC | PRN
  Administered 2024-09-27: 4 mg via INTRAVENOUS

## 2024-09-27 MED ORDER — LIDOCAINE HCL (PF) 2 % IJ SOLN
INTRAMUSCULAR | Status: AC
  Filled 2024-09-27: qty 5

## 2024-09-27 MED ORDER — PROPOFOL 1000 MG/100ML IV EMUL
INTRAVENOUS | Status: AC
  Filled 2024-09-27: qty 100

## 2024-09-27 MED ORDER — EPHEDRINE 5 MG/ML INJ
INTRAVENOUS | Status: AC
  Filled 2024-09-27: qty 5

## 2024-09-27 MED ORDER — CEFAZOLIN SODIUM-DEXTROSE 2-4 GM/100ML-% IV SOLN: 2.0000 g | Freq: Once | INTRAVENOUS | Status: DC

## 2024-09-27 MED ORDER — TIZANIDINE HCL 2 MG PO TABS: 2.0000 mg | ORAL_TABLET | Freq: Three times a day (TID) | ORAL | 0 refills | Status: AC | PRN

## 2024-09-27 MED ORDER — HYDROCODONE-ACETAMINOPHEN 5-325 MG PO TABS: 1.0000 | ORAL_TABLET | ORAL | Status: DC | PRN

## 2024-09-27 MED ORDER — METHOCARBAMOL 500 MG PO TABS
500.0000 mg | ORAL_TABLET | Freq: Four times a day (QID) | ORAL | Status: DC | PRN
  Administered 2024-09-27: 500 mg via ORAL

## 2024-09-27 MED ORDER — OXYCODONE HCL 5 MG PO TABS
5.0000 mg | ORAL_TABLET | Freq: Once | ORAL | Status: AC
  Administered 2024-09-27: 5 mg via ORAL

## 2024-09-27 MED ORDER — OXYCODONE HCL 5 MG PO TABS
ORAL_TABLET | ORAL | Status: AC
  Filled 2024-09-27: qty 1

## 2024-09-27 MED ORDER — HYDROCODONE-ACETAMINOPHEN 5-325 MG PO TABS
ORAL_TABLET | ORAL | Status: AC
  Filled 2024-09-27: qty 1

## 2024-09-27 MED ORDER — CELECOXIB 200 MG PO CAPS: 200.0000 mg | ORAL_CAPSULE | Freq: Two times a day (BID) | ORAL | 0 refills | Status: AC

## 2024-09-27 MED ORDER — SODIUM CHLORIDE (PF) 0.9 % IJ SOLN
INTRAMUSCULAR | Status: DC | PRN
  Administered 2024-09-27: 100 mL

## 2024-09-27 MED ORDER — ROPIVACAINE HCL 5 MG/ML IJ SOLN
INTRAMUSCULAR | Status: DC | PRN
  Administered 2024-09-27: 20 mL via PERINEURAL

## 2024-09-27 MED ORDER — METHOCARBAMOL 1000 MG/10ML IJ SOLN: 500.0000 mg | Freq: Four times a day (QID) | INTRAMUSCULAR | Status: DC | PRN

## 2024-09-27 MED ORDER — ASPIRIN 325 MG PO TBEC: 325.0000 mg | DELAYED_RELEASE_TABLET | Freq: Two times a day (BID) | ORAL | 0 refills | Status: AC

## 2024-09-27 MED ORDER — DROPERIDOL 2.5 MG/ML IJ SOLN: 0.6250 mg | Freq: Once | INTRAMUSCULAR | Status: DC | PRN

## 2024-09-27 MED ORDER — DOCUSATE SODIUM 100 MG PO CAPS: 100.0000 mg | ORAL_CAPSULE | Freq: Two times a day (BID) | ORAL | 0 refills | Status: AC

## 2024-09-27 MED ORDER — PROPOFOL 500 MG/50ML IV EMUL
INTRAVENOUS | Status: DC | PRN
  Administered 2024-09-27: 20 mg via INTRAVENOUS
  Administered 2024-09-27 (×2): 30 mg via INTRAVENOUS
  Administered 2024-09-27: 85 ug/kg/min via INTRAVENOUS
  Administered 2024-09-27: 10 mg via INTRAVENOUS

## 2024-09-27 MED ORDER — BUPIVACAINE LIPOSOME 1.3 % IJ SUSP
INTRAMUSCULAR | Status: AC
  Filled 2024-09-27: qty 20

## 2024-09-27 MED ORDER — MEPIVACAINE HCL (PF) 2 % IJ SOLN
INTRAMUSCULAR | Status: AC
  Filled 2024-09-27: qty 20

## 2024-09-27 MED ORDER — BUPIVACAINE-EPINEPHRINE (PF) 0.5% -1:200000 IJ SOLN
INTRAMUSCULAR | Status: AC
  Filled 2024-09-27: qty 30

## 2024-09-27 MED ORDER — TRANEXAMIC ACID-NACL 1000-0.7 MG/100ML-% IV SOLN
1000.0000 mg | INTRAVENOUS | Status: AC
  Administered 2024-09-27: 1000 mg via INTRAVENOUS
  Filled 2024-09-27: qty 100

## 2024-09-27 MED ORDER — METHOCARBAMOL 500 MG PO TABS
ORAL_TABLET | ORAL | Status: AC
  Filled 2024-09-27: qty 1

## 2024-09-27 MED ORDER — OXYCODONE HCL 5 MG PO TABS
5.0000 mg | ORAL_TABLET | Freq: Once | ORAL | Status: AC | PRN
  Administered 2024-09-27: 5 mg via ORAL

## 2024-09-27 MED ORDER — SODIUM CHLORIDE 0.9 % IR SOLN
Status: DC | PRN
  Administered 2024-09-27: 1000 mL

## 2024-09-27 MED ORDER — SODIUM CHLORIDE (PF) 0.9 % IJ SOLN
INTRAMUSCULAR | Status: AC
  Filled 2024-09-27: qty 50

## 2024-09-27 MED ORDER — TRANEXAMIC ACID-NACL 1000-0.7 MG/100ML-% IV SOLN
1000.0000 mg | Freq: Once | INTRAVENOUS | Status: AC
  Administered 2024-09-27: 1000 mg via INTRAVENOUS

## 2024-09-27 MED ORDER — LACTATED RINGERS IV BOLUS
500.0000 mL | Freq: Once | INTRAVENOUS | Status: AC
  Administered 2024-09-27: 500 mL via INTRAVENOUS

## 2024-09-27 MED ORDER — 0.9 % SODIUM CHLORIDE (POUR BTL) OPTIME
TOPICAL | Status: DC | PRN
  Administered 2024-09-27: 1000 mL

## 2024-09-27 MED ORDER — OXYCODONE HCL 5 MG/5ML PO SOLN: 5.0000 mg | Freq: Once | ORAL | Status: AC | PRN

## 2024-09-27 MED ORDER — HYDROMORPHONE HCL 1 MG/ML IJ SOLN
0.2500 mg | INTRAMUSCULAR | Status: DC | PRN
  Administered 2024-09-27: 0.5 mg via INTRAVENOUS
  Administered 2024-09-27: 0.25 mg via INTRAVENOUS

## 2024-09-27 MED ORDER — FENTANYL CITRATE (PF) 50 MCG/ML IJ SOSY
50.0000 ug | PREFILLED_SYRINGE | INTRAMUSCULAR | Status: DC
  Administered 2024-09-27: 50 ug via INTRAVENOUS
  Filled 2024-09-27: qty 2

## 2024-09-27 MED ORDER — MEPIVACAINE HCL (PF) 2 % IJ SOLN
INTRAMUSCULAR | Status: DC | PRN
  Administered 2024-09-27: 3 mL via INTRATHECAL

## 2024-09-27 SURGICAL SUPPLY — 49 items
ATTUNE MED DOME PAT 38 KNEE (Knees) IMPLANT
ATTUNE PS FEM LT SZ 6 CEM KNEE (Femur) IMPLANT
ATTUNE PSRP INSR SZ6 5 KNEE (Insert) IMPLANT
BAG COUNTER SPONGE SURGICOUNT (BAG) IMPLANT
BAG ZIPLOCK 12X15 (MISCELLANEOUS) ×1 IMPLANT
BASE TIBIA ATTUNE KNEE SYS SZ6 (Knees) IMPLANT
BLADE SAGITTAL 25.0X1.19X90 (BLADE) ×1 IMPLANT
BLADE SAW SGTL 13.0X1.19X90.0M (BLADE) ×1 IMPLANT
BNDG ELASTIC 6INX 5YD STR LF (GAUZE/BANDAGES/DRESSINGS) IMPLANT
BNDG ELASTIC 6X10 VLCR STRL LF (GAUZE/BANDAGES/DRESSINGS) IMPLANT
BOOTIES KNEE HIGH SLOAN (MISCELLANEOUS) IMPLANT
BOWL SMART MIX CTS (DISPOSABLE) ×1 IMPLANT
CEMENT HV SMART SET (Cement) ×2 IMPLANT
CLSR STERI-STRIP ANTIMIC 1/2X4 (GAUZE/BANDAGES/DRESSINGS) IMPLANT
COVER SURGICAL LIGHT HANDLE (MISCELLANEOUS) ×1 IMPLANT
CUFF TRNQT CYL 34X4.125X (TOURNIQUET CUFF) ×1 IMPLANT
DERMABOND ADVANCED .7 DNX12 (GAUZE/BANDAGES/DRESSINGS) ×1 IMPLANT
DRAPE U-SHAPE 47X51 STRL (DRAPES) ×1 IMPLANT
DRSG AQUACEL AG ADV 3.5X10 (GAUZE/BANDAGES/DRESSINGS) ×1 IMPLANT
DURAPREP 26ML APPLICATOR (WOUND CARE) ×1 IMPLANT
ELECT PENCIL ROCKER SW 15FT (MISCELLANEOUS) ×1 IMPLANT
ELECT REM PT RETURN 15FT ADLT (MISCELLANEOUS) ×1 IMPLANT
GLOVE BIO SURGEON STRL SZ8.5 (GLOVE) IMPLANT
GLOVE BIOGEL PI IND STRL 8 (GLOVE) ×1 IMPLANT
GLOVE BIOGEL PI IND STRL 9 (GLOVE) IMPLANT
GLOVE ECLIPSE 7.5 STRL STRAW (GLOVE) ×1 IMPLANT
GOWN STRL REUS W/ TWL XL LVL3 (GOWN DISPOSABLE) ×2 IMPLANT
HOLDER FOLEY CATH W/STRAP (MISCELLANEOUS) IMPLANT
HOOD PEEL AWAY T7 (MISCELLANEOUS) ×3 IMPLANT
KIT TURNOVER KIT A (KITS) ×1 IMPLANT
MANIFOLD NEPTUNE II (INSTRUMENTS) ×1 IMPLANT
NEEDLE HYPO 22X1.5 SAFETY MO (MISCELLANEOUS) ×2 IMPLANT
PACK TOTAL KNEE CUSTOM (KITS) ×1 IMPLANT
PADDING CAST COTTON 6X4 STRL (CAST SUPPLIES) ×1 IMPLANT
PIN STEINMAN FIXATION KNEE (PIN) IMPLANT
PROTECTOR NERVE ULNAR (MISCELLANEOUS) ×1 IMPLANT
SET HNDPC FAN SPRY TIP SCT (DISPOSABLE) ×1 IMPLANT
SOLN STERILE WATER BTL 1000 ML (IV SOLUTION) ×2 IMPLANT
SPIKE FLUID TRANSFER (MISCELLANEOUS) ×2 IMPLANT
SUT MNCRL AB 3-0 PS2 18 (SUTURE) IMPLANT
SUT VIC AB 0 CT1 36 (SUTURE) ×2 IMPLANT
SUT VIC AB 1 CT1 36 (SUTURE) ×2 IMPLANT
SUT VIC AB 2-0 CT1 TAPERPNT 27 (SUTURE) ×1 IMPLANT
SUT VICRYL+ 3-0 36IN CT-1 (SUTURE) IMPLANT
SYR CONTROL 10ML LL (SYRINGE) ×2 IMPLANT
TRAY CATH INTERMITTENT SS 16FR (CATHETERS) IMPLANT
TRAY FOLEY MTR SLVR 16FR STAT (SET/KITS/TRAYS/PACK) IMPLANT
TUBE SUCTION HIGH CAP CLEAR NV (SUCTIONS) ×1 IMPLANT
WRAP KNEE MAXI GEL POST OP (GAUZE/BANDAGES/DRESSINGS) ×1 IMPLANT

## 2024-09-27 NOTE — Op Note (Signed)
 PATIENT ID:      [name]  MRN:     [id number] DOB/AGE:    05/04/1946 / 79 y.o.       OPERATIVE REPORT   DATE OF PROCEDURE:  09/27/2024      PREOPERATIVE DIAGNOSIS:   LEFT KNEE OSTEOARTHRITIS      Estimated body mass index is 28.32 kg/m as calculated from the following:   Height as of this encounter: 5' 4 (1.626 m).   Weight as of this encounter: 74.8 kg.                                                       POSTOPERATIVE DIAGNOSIS:   Same                                                                  PROCEDURE:  Procedures: ARTHROPLASTY, KNEE, TOTAL Using DepuyAttune RP implants # 6 left femur, # 6 tibia, 5 mm Attune RP bearing, 38 patella    SURGEON: [name]  ASSISTANT: Signe Reining PA-C   (Present and scrubbed throughout the case, critical for assistance with exposure, retraction, instrumentation, and closure.)        ANESTHESIA: Spinal, 20cc Exparel , 50cc 0.25% Marcaine  EBL: Minimal cc FLUID REPLACEMENT: Unknown cc crystaloid TOURNIQUET: none DRAINS: None TRANEXAMIC ACID : 1gm IV, 2gm topical COMPLICATIONS:  None         INDICATIONS FOR PROCEDURE: The patient has  LEFT KNEE OSTEOARTHRITIS, mild varus deformities, XR shows bone on bone arthritis, lateral subluxation of tibia. Patient has failed all conservative measures including anti-inflammatory medicines, narcotics, attempts at exercise and weight loss, cortisone injections and viscosupplementation.  Risks and benefits of surgery have been discussed, questions answered.   DESCRIPTION OF PROCEDURE: The patient identified by armband, received  IV antibiotics, in the holding area at [facility]. Patient taken to the operating room, appropriate anesthetic monitors were attached, and spinal anesthesia was  induced. IV Tranexamic acid  was given. Lateral post and 2 surefoot positioners applied to the table, the lower extremity was then prepped and draped in usual sterile fashion from the toes to the high  thigh. Time-out procedure was performed.  Signe Reining PAC, was present and scrubbed throughout the case, critical for assistance with, positioning, exposure, retraction, instrumentation, and closure.The skin and subcutaneous tissue along the incision was injected with 20 cc of a mixture of 20cc Exparel  and 30cc Marcaine  50cc saline solution, using a 21-gauge by 1-1/2 inch needle. We began the operation, with the knee flexed 130 degrees, by making the anterior midline incision starting at handbreadth above the patella going over the patella 1 cm medial to and 4 cm distal to the tibial tubercle. Small bleeders in the skin and the subcutaneous tissue identified and cauterized. Transverse retinaculum was incised and reflected medially and a medial parapatellar arthrotomy was accomplished. the patella was everted and theprepatellar fat pad resected. The superficial medial collateral ligament was then elevated from anterior to posterior along the proximal flare of the tibia and anterior half of the menisci resected. The knee was hyperflexed exposing bone on  bone arthritis. Peripheral and notch osteophytes as well as the cruciate ligaments were then resected. We continued to work our way around posteriorly along the proximal tibia, and externally rotated the tibia subluxing it out from underneath the femur. A McHale PCL retractor was placed through the notch, a lateral Hohmann retractor, and anterolateral small homan retractor placed. We then entered the proximal tibia with the Depuy starter drill in line with the axis of the tibia followed by an intramedullary guide rod and 3 degree posterior slope cutting guide. The tibial cutting guide, was pinned into place allowing resection of 2 mm of bone medially and 10 mm of bone laterally. Satisfied with the tibial resection, we then entered the distal femur 2 mm anterior to the PCL origin with the starter drill, followed by the intramedullary guide rod and applied the distal  femoral cutting guide set at 9 mm, with 5 degrees of valgus. This was pinned along the epicondylar axis. At this point, the distal femoral cut was accomplished without difficulty. We then sized for a # 6 femoral component and pinned the chamfer guide in 3 degrees of external rotation. The anterior, posterior, and chamfer cuts were accomplished without difficulty followed by the Attune RP box cutting guide and the box cut. We also removed posterior osteophytes from the posterior femoral condyles. The posterior capsule was injected with Exparel  solution. The knee was brought into full extension. We checked our extension gap and fit a 5 mm trial lollipop. Distracting in extension with a lamina spreader,  bleeders in the posterior capsule, Posterior medial and posterior lateral gutter were cauterized.  The transexamic acid-soaked sponge was then placed in the gap of the knee in extension. The knee was flexed 30. The posterior patella cut was accomplished with the 9.5 mm Attune cutting guide, sized for a 38 mm dome, and the fixation pegs drilled.The knee was then once again hyperflexed exposing the proximal tibia. We sized for a # 6 tibial base plate, applied the smokestack and the conical reamer followed by the the Delta fin keel punch. We then hammered into place the Attune RP trial femoral component, drilled the lugs, inserted a 5 mm trial bearing, trial patellar button, and took the knee through range of motion from 0-130 degrees. Medial and lateral ligamentous stability was checked. No thumb pressure was required for patellar Tracking.  All trial components were removed, mating surfaces irrigated with pulse lavage, and dried with suction and sponges. Exparel  solution was applied to the cancellus bone of the patella distal femur and proximal tibia.  After waiting 30 seconds, the bony surfaces were again, dried with sponges. A double batch of DePuy HV cement was mixed and applied to all bony metallic mating surfaces  except for the posterior condyles of the femur itself. In order, we hammered into place the tibial tray and removed excess cement, the femoral component and removed excess cement. The final Attune RP bearing was inserted, and the knee brought to full extension with compression. The patellar button was clamped into place, and excess cement removed. The knee was held at 30 flexion with compression using the second surefoot, while the cement cured. The wound was irrigated out with normal saline solution pulse lavage. The rest of the Exparel  was injected into the parapatellar arthrotomy, subcutaneous tissues, and periosteal tissues. The parapatellar arthrotomy was closed with running #1 Vicryl suture. The subcutaneous tissue with 3-0 undyed Vicryl suture, and the skin with running 3-0 SQ vicryl. An Aquacil dressing and Ace wrap  were applied. The patient was taken to recovery room without difficulty.   [name] 09/27/2024, 3:40 PM

## 2024-09-27 NOTE — Interval H&P Note (Signed)
 History and Physical Interval Note:  09/27/2024 9:42 AM  [name]  has presented today for surgery, with the diagnosis of LEFT KNEE OSTEOARTHRITIS.  The various methods of treatment have been discussed with the patient and family. After consideration of risks, benefits and other options for treatment, the patient has consented to  Procedures: ARTHROPLASTY, KNEE, TOTAL (Left) as a surgical intervention.  The patient's history has been reviewed, patient examined, no change in status, stable for surgery.  I have reviewed the patient's chart and labs.  Questions were answered to the patient's satisfaction.     [name]

## 2024-09-27 NOTE — Evaluation (Signed)
 " Physical Therapy Evaluation Patient Details Name: [name] MRN: [id number] DOB: 06-26-1946 Today's Date: 09/27/2024  History of Present Illness  79 yo female presents to therapy following L TKA on 09/27/2024 due to failure of conservative measures. Pt PMH includes but is not limited to: L ba ca s/p B mastectomy, HLD, carpal tunnel syndrome, OA, OSA and R TKA on 05/24/2024.  Clinical Impression    [name] is a 79 y.o. female POD 0 s/p L TKA. Patient reports IND with mobility at baseline. Patient is now limited by functional impairments (see PT problem list below) and requires CGA for transfers and gait with RW. Patient was able to ambulate 50 and 40 feet with RW and CGA and cues for safe walker management. Patient educated on safe sequencing for stair mobility with B UE support on L handrail, fall risk prevention, use of CP/ice, pain management and goal,  and car transfers pt and sister verbalized understanding of safe guarding position for people assisting with mobility. Patient instructed in exercises to facilitate ROM and circulation reviewed and HO provided. Patient will benefit from continued skilled PT interventions to address impairments and progress towards PLOF. Patient has met mobility goals at adequate level for discharge home with OPPT services; will continue to follow if pt continues acute stay to progress towards Mod I goals.       If plan is discharge home, recommend the following: A little help with walking and/or transfers;A little help with bathing/dressing/bathroom;Assistance with cooking/housework;Assist for transportation;Help with stairs or ramp for entrance   Can travel by private vehicle        Equipment Recommendations None recommended by PT  Recommendations for Other Services       Functional Status Assessment Patient has had a recent decline in their functional status and demonstrates the ability to make significant improvements in function in a reasonable  and predictable amount of time.     Precautions / Restrictions Precautions Precautions: Knee;Fall Restrictions Weight Bearing Restrictions Per Provider Order: No      Mobility  Bed Mobility Overal bed mobility: Needs Assistance Bed Mobility: Supine to Sit     Supine to sit: Supervision     General bed mobility comments: min cues and HOB slightly elevated    Transfers Overall transfer level: Needs assistance Equipment used: Rolling walker (2 wheels) Transfers: Sit to/from Stand Sit to Stand: Contact guard assist           General transfer comment: min cues for UE and AD placement    Ambulation/Gait Ambulation/Gait assistance: Contact guard assist Gait Distance (Feet): 50 Feet Assistive device: Rolling walker (2 wheels) Gait Pattern/deviations: Step-to pattern, Decreased stance time - left, Knee flexed in stance - left, Antalgic, Trunk flexed Gait velocity: decreased     General Gait Details: slight trunk flexion with B UE support at RW to offload L LE in stance phase, noted to have soft knee when in stance phase and L knee flexion, cues for co-contraction with quads, hamstrings and glutes with improved stability, no overt LOB nor sudden L knee flexion. pt is able to compensate with cues as above and B UE support, min cues for safety, RW management  Stairs Stairs: Yes Stairs assistance: Contact guard assist Stair Management: Two rails Number of Stairs: 3 General stair comments: step navigation initiated with B handrails, cues for L knee stability, step to pattern and safety pt able to progress to B UE support on L handrail only with cues as above  Wheelchair Mobility     Tilt Bed    Modified Rankin (Stroke Patients Only)       Balance Overall balance assessment: Needs assistance Sitting-balance support: Feet supported Sitting balance-Leahy Scale: Good     Standing balance support: Bilateral upper extremity supported, During functional activity,  Reliant on assistive device for balance Standing balance-Leahy Scale: Fair Standing balance comment: static standing no UE support mild anterior LOB and pt able to recover                             Pertinent Vitals/Pain Pain Assessment Pain Assessment: 0-10 Pain Score: 2  Pain Location: L knee and LE Pain Descriptors / Indicators: Aching, Constant, Dull, Grimacing, Operative site guarding Pain Intervention(s): Limited activity within patient's tolerance, Monitored during session, Repositioned, Premedicated before session, Ice applied    Home Living Family/patient expects to be discharged to:: Private residence Living Arrangements: Alone Available Help at Discharge: Family Type of Home: House Home Access: Stairs to enter Entrance Stairs-Rails: Left Entrance Stairs-Number of Steps: 4   Home Layout: Two level;Able to live on main level with bedroom/bathroom Home Equipment: Rolling Walker (2 wheels);Cane - single point;Adaptive equipment      Prior Function Prior Level of Function : Independent/Modified Independent;Driving             Mobility Comments: IND no AD for all ADLs, self care tasks and IADLs       Extremity/Trunk Assessment        Lower Extremity Assessment Lower Extremity Assessment: LLE deficits/detail LLE Deficits / Details: ankle DF/PF 5/5; SLR < 10 degree lag LLE Sensation: decreased light touch (dorsal surface of L foot)    Cervical / Trunk Assessment Cervical / Trunk Assessment: Normal  Communication   Communication Communication: No apparent difficulties    Cognition Arousal: Alert Behavior During Therapy: WFL for tasks assessed/performed   PT - Cognitive impairments: No apparent impairments                         Following commands: Intact       Cueing       General Comments      Exercises Total Joint Exercises Ankle Circles/Pumps: AROM, Both, 15 reps Quad Sets: AROM, Left, 5 reps Short Arc Quad: AROM,  Left, 5 reps Heel Slides: AROM, Left, 5 reps Hip ABduction/ADduction: AROM, Left, 5 reps Straight Leg Raises: AROM, Left, 5 reps Long Arc Quad: AROM, Left, 5 reps Knee Flexion: AROM, Left, 5 reps, Seated   Assessment/Plan    PT Assessment Patient needs continued PT services  PT Problem List Decreased strength;Decreased range of motion;Decreased activity tolerance;Decreased balance;Decreased mobility;Decreased coordination;Pain       PT Treatment Interventions Gait training;DME instruction;Stair training;Functional mobility training;Therapeutic activities;Therapeutic exercise;Balance training;Neuromuscular re-education;Patient/family education;Modalities    PT Goals (Current goals can be found in the Care Plan section)  Acute Rehab PT Goals Patient Stated Goal: to be able to go up steps more normally and go for a walk, be wonder woman. PT Goal Formulation: With patient Time For Goal Achievement: 10/11/24 Potential to Achieve Goals: Good    Frequency 7X/week     Co-evaluation               AM-PAC PT 6 Clicks Mobility  Outcome Measure Help needed turning from your back to your side while in a flat bed without using bedrails?: None Help needed moving from lying on your  back to sitting on the side of a flat bed without using bedrails?: A Little Help needed moving to and from a bed to a chair (including a wheelchair)?: A Little Help needed standing up from a chair using your arms (e.g., wheelchair or bedside chair)?: A Little Help needed to walk in hospital room?: A Little Help needed climbing 3-5 steps with a railing? : A Little 6 Click Score: 19    End of Session Equipment Utilized During Treatment: Gait belt Activity Tolerance: Patient tolerated treatment well;No increased pain Patient left: in chair;with call bell/phone within reach;with nursing/sitter in room;with family/visitor present Nurse Communication: Mobility status;Other (comment) (pt readiness for same day  d/c from PT standpoint) PT Visit Diagnosis: Unsteadiness on feet (R26.81);Other abnormalities of gait and mobility (R26.89);Muscle weakness (generalized) (M62.81);Pain;Difficulty in walking, not elsewhere classified (R26.2) Pain - Right/Left: Left Pain - part of body: Knee;Leg    Time: 1652-1730 PT Time Calculation (min) (ACUTE ONLY): 38 min   Charges:   PT Evaluation $PT Eval Low Complexity: 1 Low PT Treatments $Gait Training: 8-22 mins $Therapeutic Exercise: 8-22 mins PT General Charges $$ ACUTE PT VISIT: 1 Visit         [place], PT Acute Rehab   [place] VEAR Drone 09/27/2024, 5:38 PM "

## 2024-09-27 NOTE — Anesthesia Preprocedure Evaluation (Signed)
 "                                  Anesthesia Evaluation  Patient identified by MRN, date of birth, ID band Patient awake    Reviewed: Allergy & Precautions, NPO status , Patient's Chart, lab work & pertinent test results  Airway Mallampati: II       Dental no notable dental hx.    Pulmonary sleep apnea    Pulmonary exam normal        Cardiovascular +CHF  Normal cardiovascular exam  Echo:  - Left ventricle: The cavity size was normal. Wall thickness    was normal. Systolic function was normal. The estimated    ejection fraction was in the range of 55% to 60%. Lateral    S' 11.6 cm/sec. Wall motion was normal; there were no    regional wall motion abnormalities. Doppler parameters are    consistent with abnormal left ventricular relaxation    (grade 1 diastolic dysfunction).  - Aortic valve: Poorly visualized. There was no stenosis.    Trivial regurgitation.  - Mitral valve: No significant regurgitation.  - Right ventricle: The cavity size was normal. Systolic    function was normal.  - Pulmonary arteries: No complete TR doppler jet so unable    to estimate PA systolic pressure.  - Inferior vena cava: The vessel was normal in size; the    respirophasic diameter changes were in the normal range (=    50%); findings are consistent with normal central venous    pressure.     Neuro/Psych  PSYCHIATRIC DISORDERS  Depression     Neuromuscular disease    GI/Hepatic Neg liver ROS,GERD  ,,  Endo/Other  negative endocrine ROS    Renal/GU negative Renal ROS     Musculoskeletal  (+) Arthritis ,    Abdominal   Peds  Hematology negative hematology ROS (+)   Anesthesia Other Findings   Reproductive/Obstetrics                              Anesthesia Physical Anesthesia Plan  ASA: 3  Anesthesia Plan: Spinal   Post-op Pain Management: Regional block*   Induction: Intravenous  PONV Risk Score and Plan: 0 and Propofol   infusion  Airway Management Planned: Natural Airway and Nasal Cannula  Additional Equipment: None  Intra-op Plan:   Post-operative Plan:   Informed Consent: I have reviewed the patients History and Physical, chart, labs and discussed the procedure including the risks, benefits and alternatives for the proposed anesthesia with the patient or authorized representative who has indicated his/her understanding and acceptance.       Plan Discussed with: CRNA  Anesthesia Plan Comments: (Lab Results      Component                Value               Date                      WBC                      5.5                 09/21/2024                HGB  14.0                09/21/2024                HCT                      43.1                09/21/2024                MCV                      89.4                09/21/2024                PLT                      169                 09/21/2024           )        Anesthesia Quick Evaluation  "

## 2024-09-27 NOTE — Discharge Instructions (Signed)

## 2024-09-27 NOTE — Transfer of Care (Signed)
 Immediate Anesthesia Transfer of Care Note  Patient: [name]  Procedure(s) Performed: ARTHROPLASTY, KNEE, TOTAL (Left: Knee)  Patient Location: PACU  Anesthesia Type:Spinal  Level of Consciousness: awake, alert , oriented, and patient cooperative  Airway & Oxygen Therapy: Patient Spontanous Breathing and Patient connected to face mask oxygen  Post-op Assessment: Report given to RN and Post -op Vital signs reviewed and stable  Post vital signs: Reviewed and stable  Last Vitals:  Vitals Value Taken Time  BP 96/61 09/27/24 13:00  Temp    Pulse 82 09/27/24 13:03  Resp 15 09/27/24 13:03  SpO2 94 % 09/27/24 13:03  Vitals shown include unfiled device data.  Last Pain:  Vitals:   09/27/24 1030  TempSrc:   PainSc: 0-No pain         Complications: There were no known notable events for this encounter.

## 2024-09-27 NOTE — Anesthesia Procedure Notes (Signed)
 Procedure Name: MAC Date/Time: 09/27/2024 11:20 AM  Performed by: Erick Fitz, CRNAPre-anesthesia Checklist: Patient identified, Emergency Drugs available, Suction available, Patient being monitored and Timeout performed Patient Re-evaluated:Patient Re-evaluated prior to induction Oxygen Delivery Method: Simple face mask Preoxygenation: Pre-oxygenation with 100% oxygen Induction Type: IV induction Placement Confirmation: positive ETCO2 and CO2 detector Dental Injury: Teeth and Oropharynx as per pre-operative assessment

## 2024-09-27 NOTE — Anesthesia Procedure Notes (Signed)
 Spinal  Start time: 09/27/2024 11:14 AM End time: 09/27/2024 11:16 AM Reason for block: surgical anesthesia  Staffing Performed: anesthesiologist  Authorized by: Tilford Franky BIRCH, MD   Performed by: Tilford Franky BIRCH, MD  Preanesthetic Checklist Completed: patient identified, IV checked, site marked, risks and benefits discussed, surgical consent, monitors and equipment checked, pre-op evaluation and timeout performed Spinal Block Patient position: sitting Prep: DuraPrep and site prepped and draped Location: L3-4 Injection technique: single-shot Needle Needle type: Pencan  Needle gauge: 24 G Needle length: 10 cm Needle insertion depth (cm): 10  Additional Notes Patient tolerated well. No immediate complications.  Functioning IV was confirmed and monitors were applied. Sterile prep and drape, including hand hygiene and sterile gloves were used. The patient was positioned and the back was prepped. The skin was anesthetized with lidocaine . Free flow of clear CSF was obtained prior to injecting local anesthetic into the CSF. The spinal needle aspirated freely following injection. The needle was carefully withdrawn. The patient tolerated the procedure well.

## 2024-09-27 NOTE — Anesthesia Procedure Notes (Signed)
 Anesthesia Regional Block: Adductor canal block   Pre-Anesthetic Checklist: , timeout performed,  Correct Patient, Correct Site, Correct Laterality,  Correct Procedure, Correct Position, site marked,  Risks and benefits discussed,  Surgical consent,  Pre-op evaluation,  At surgeon's request and post-op pain management  Laterality: Left  Prep: chloraprep       Needles:  Injection technique: Single-shot  Needle Type: Echogenic Stimulator Needle     Needle Length: 9cm  Needle Gauge: 21     Additional Needles:   Procedures:,,,, ultrasound used (permanent image in chart),,    Narrative:  Start time: 09/27/2024 10:15 AM End time: 09/27/2024 10:20 AM Injection made incrementally with aspirations every 5 mL.  Performed by: Personally  Anesthesiologist: Tilford Franky BIRCH, MD  Additional Notes: Discussed risks and benefits of the nerve block in detail, including but not limited vascular injury, permanent nerve damage and infection.   Patient tolerated the procedure well. Local anesthetic introduced in an incremental fashion under minimal resistance after negative aspirations. No paresthesias were elicited. After completion of the procedure, no acute issues were identified and patient continued to be monitored by RN.

## 2024-09-28 NOTE — Anesthesia Postprocedure Evaluation (Signed)
"   Anesthesia Post Note  Patient: [name]  Procedure(s) Performed: ARTHROPLASTY, KNEE, TOTAL (Left: Knee)     Patient location during evaluation: PACU Anesthesia Type: Spinal Level of consciousness: oriented and awake and alert Pain management: pain level controlled Vital Signs Assessment: post-procedure vital signs reviewed and stable Respiratory status: spontaneous breathing, respiratory function stable and patient connected to nasal cannula oxygen Cardiovascular status: blood pressure returned to baseline and stable Postop Assessment: no headache, no backache and no apparent nausea or vomiting Anesthetic complications: no   There were no known notable events for this encounter.      Franky JONETTA Bald      "

## 2024-09-29 ENCOUNTER — Encounter (HOSPITAL_COMMUNITY): Payer: Self-pay | Admitting: Orthopedic Surgery
